# Patient Record
Sex: Male | Born: 1975 | Race: White | Hispanic: No | Marital: Single | Smoking: Current every day smoker
Health system: Southern US, Community
[De-identification: ages and names within clinical notes are randomized; demographics above are authoritative.]

## PROBLEM LIST (undated history)

## (undated) ENCOUNTER — Emergency Department (HOSPITAL_COMMUNITY): Admission: EM | Payer: Medicaid Other | Source: Home / Self Care

## (undated) DIAGNOSIS — F419 Anxiety disorder, unspecified: Secondary | ICD-10-CM

## (undated) DIAGNOSIS — G709 Myoneural disorder, unspecified: Secondary | ICD-10-CM

## (undated) DIAGNOSIS — F32A Depression, unspecified: Secondary | ICD-10-CM

## (undated) DIAGNOSIS — I10 Essential (primary) hypertension: Secondary | ICD-10-CM

## (undated) DIAGNOSIS — F1990 Other psychoactive substance use, unspecified, uncomplicated: Secondary | ICD-10-CM

## (undated) DIAGNOSIS — F329 Major depressive disorder, single episode, unspecified: Secondary | ICD-10-CM

## (undated) DIAGNOSIS — M549 Dorsalgia, unspecified: Secondary | ICD-10-CM

## (undated) DIAGNOSIS — T7840XA Allergy, unspecified, initial encounter: Secondary | ICD-10-CM

## (undated) DIAGNOSIS — F4310 Post-traumatic stress disorder, unspecified: Secondary | ICD-10-CM

## (undated) DIAGNOSIS — M79604 Pain in right leg: Secondary | ICD-10-CM

## (undated) DIAGNOSIS — F319 Bipolar disorder, unspecified: Secondary | ICD-10-CM

## (undated) HISTORY — DX: Anxiety disorder, unspecified: F41.9

## (undated) HISTORY — DX: Myoneural disorder, unspecified: G70.9

## (undated) HISTORY — DX: Allergy, unspecified, initial encounter: T78.40XA

## (undated) HISTORY — PX: CHEST TUBE INSERTION: SHX231

---

## 2001-07-09 ENCOUNTER — Emergency Department (HOSPITAL_COMMUNITY): Admission: EM | Admit: 2001-07-09 | Discharge: 2001-07-09 | Payer: Self-pay | Admitting: Emergency Medicine

## 2001-07-09 ENCOUNTER — Encounter: Payer: Self-pay | Admitting: Emergency Medicine

## 2002-01-04 ENCOUNTER — Inpatient Hospital Stay (HOSPITAL_COMMUNITY): Admission: EM | Admit: 2002-01-04 | Discharge: 2002-01-09 | Payer: Self-pay | Admitting: Emergency Medicine

## 2002-01-04 ENCOUNTER — Encounter: Payer: Self-pay | Admitting: General Surgery

## 2002-01-04 ENCOUNTER — Encounter: Payer: Self-pay | Admitting: Emergency Medicine

## 2002-01-05 ENCOUNTER — Encounter: Payer: Self-pay | Admitting: General Surgery

## 2002-01-07 ENCOUNTER — Encounter: Payer: Self-pay | Admitting: General Surgery

## 2002-01-09 ENCOUNTER — Encounter: Payer: Self-pay | Admitting: General Surgery

## 2002-01-10 ENCOUNTER — Encounter: Payer: Self-pay | Admitting: Family Medicine

## 2002-01-10 ENCOUNTER — Inpatient Hospital Stay (HOSPITAL_COMMUNITY): Admission: EM | Admit: 2002-01-10 | Discharge: 2002-01-10 | Payer: Self-pay | Admitting: Emergency Medicine

## 2002-01-15 ENCOUNTER — Emergency Department (HOSPITAL_COMMUNITY): Admission: EM | Admit: 2002-01-15 | Discharge: 2002-01-15 | Payer: Self-pay | Admitting: *Deleted

## 2002-01-15 ENCOUNTER — Encounter: Payer: Self-pay | Admitting: *Deleted

## 2002-03-01 ENCOUNTER — Emergency Department (HOSPITAL_COMMUNITY): Admission: EM | Admit: 2002-03-01 | Discharge: 2002-03-01 | Payer: Self-pay | Admitting: *Deleted

## 2002-03-01 ENCOUNTER — Encounter: Payer: Self-pay | Admitting: *Deleted

## 2003-04-02 ENCOUNTER — Emergency Department (HOSPITAL_COMMUNITY): Admission: EM | Admit: 2003-04-02 | Discharge: 2003-04-02 | Payer: Self-pay | Admitting: Emergency Medicine

## 2007-05-09 ENCOUNTER — Emergency Department: Payer: Self-pay | Admitting: Emergency Medicine

## 2008-01-15 ENCOUNTER — Inpatient Hospital Stay (HOSPITAL_COMMUNITY): Admission: EM | Admit: 2008-01-15 | Discharge: 2008-01-17 | Payer: Self-pay | Admitting: Emergency Medicine

## 2008-01-15 ENCOUNTER — Ambulatory Visit: Payer: Self-pay | Admitting: Cardiology

## 2010-01-16 ENCOUNTER — Emergency Department (HOSPITAL_COMMUNITY): Admission: EM | Admit: 2010-01-16 | Discharge: 2010-01-16 | Payer: Self-pay | Admitting: Emergency Medicine

## 2010-02-12 ENCOUNTER — Emergency Department (HOSPITAL_COMMUNITY): Admission: EM | Admit: 2010-02-12 | Discharge: 2010-02-12 | Payer: Self-pay | Admitting: Emergency Medicine

## 2010-03-03 ENCOUNTER — Emergency Department (HOSPITAL_COMMUNITY): Admission: EM | Admit: 2010-03-03 | Discharge: 2010-03-03 | Payer: Self-pay | Admitting: Emergency Medicine

## 2010-05-25 ENCOUNTER — Emergency Department (HOSPITAL_COMMUNITY): Payer: Self-pay

## 2010-05-25 ENCOUNTER — Emergency Department (HOSPITAL_COMMUNITY)
Admission: EM | Admit: 2010-05-25 | Discharge: 2010-05-25 | Disposition: A | Discharge status: Home / Self Care | Payer: Self-pay | Attending: Emergency Medicine | Admitting: Emergency Medicine

## 2010-05-25 DIAGNOSIS — X500XXA Overexertion from strenuous movement or load, initial encounter: Secondary | ICD-10-CM | POA: Insufficient documentation

## 2010-05-25 DIAGNOSIS — IMO0002 Reserved for concepts with insufficient information to code with codable children: Secondary | ICD-10-CM | POA: Insufficient documentation

## 2010-06-09 ENCOUNTER — Emergency Department (HOSPITAL_COMMUNITY)
Admission: EM | Admit: 2010-06-09 | Discharge: 2010-06-09 | Disposition: A | Discharge status: Home / Self Care | Payer: Self-pay | Attending: Emergency Medicine | Admitting: Emergency Medicine

## 2010-06-09 ENCOUNTER — Emergency Department (HOSPITAL_COMMUNITY): Payer: Self-pay

## 2010-06-09 DIAGNOSIS — R071 Chest pain on breathing: Secondary | ICD-10-CM | POA: Insufficient documentation

## 2010-06-09 DIAGNOSIS — F1410 Cocaine abuse, uncomplicated: Secondary | ICD-10-CM | POA: Insufficient documentation

## 2010-06-09 DIAGNOSIS — F411 Generalized anxiety disorder: Secondary | ICD-10-CM | POA: Insufficient documentation

## 2010-06-09 DIAGNOSIS — I252 Old myocardial infarction: Secondary | ICD-10-CM | POA: Insufficient documentation

## 2010-06-09 DIAGNOSIS — F3289 Other specified depressive episodes: Secondary | ICD-10-CM | POA: Insufficient documentation

## 2010-06-09 DIAGNOSIS — F329 Major depressive disorder, single episode, unspecified: Secondary | ICD-10-CM | POA: Insufficient documentation

## 2010-06-09 LAB — DIFFERENTIAL
Basophils Absolute: 0 10*3/uL (ref 0.0–0.1)
Basophils Relative: 1 % (ref 0–1)
Eosinophils Absolute: 0.2 10*3/uL (ref 0.0–0.7)
Eosinophils Relative: 4 % (ref 0–5)
Lymphocytes Relative: 42 % (ref 12–46)
Lymphs Abs: 2.9 10*3/uL (ref 0.7–4.0)
Monocytes Absolute: 0.5 10*3/uL (ref 0.1–1.0)
Monocytes Relative: 7 % (ref 3–12)
Neutro Abs: 3.2 K/uL (ref 1.7–7.7)
Neutrophils Relative %: 47 % (ref 43–77)

## 2010-06-09 LAB — POCT CARDIAC MARKERS
CKMB, poc: <1 ng/mL — ABNORMAL LOW (ref 1.0–8.0)
CKMB, poc: <1 ng/mL — ABNORMAL LOW (ref 1.0–8.0)
Myoglobin, poc: 45 ng/mL (ref 12–200)
Myoglobin, poc: 52.2 ng/mL (ref 12–200)
Troponin i, poc: <0.05 ng/mL (ref 0.00–0.09)
Troponin i, poc: <0.05 ng/mL (ref 0.00–0.09)

## 2010-06-09 LAB — CBC
HCT: 48.3 % (ref 39.0–52.0)
Hemoglobin: 16.8 g/dL (ref 13.0–17.0)
MCH: 34.4 pg — ABNORMAL HIGH (ref 26.0–34.0)
MCHC: 34.8 g/dL (ref 30.0–36.0)
MCV: 98.8 fL (ref 78.0–100.0)
Platelets: 333 10*3/uL (ref 150–400)
RBC: 4.89 MIL/uL (ref 4.22–5.81)
RDW: 12.1 % (ref 11.5–15.5)
WBC: 6.9 K/uL (ref 4.0–10.5)

## 2010-06-09 LAB — RAPID URINE DRUG SCREEN, HOSP PERFORMED
Amphetamines: NOT DETECTED
Barbiturates: NOT DETECTED
Benzodiazepines: POSITIVE — AB
Cocaine: NOT DETECTED
Opiates: POSITIVE — AB
Tetrahydrocannabinol: NOT DETECTED

## 2010-06-09 LAB — BASIC METABOLIC PANEL WITH GFR
Chloride: 98 meq/L (ref 96–112)
GFR calc Af Amer: >60 mL/min (ref >60)
Glucose, Bld: 107 mg/dL — ABNORMAL HIGH (ref 70–99)
Potassium: 4 meq/L (ref 3.5–5.1)
Sodium: 136 meq/L (ref 135–145)

## 2010-06-09 LAB — BASIC METABOLIC PANEL
BUN: 12 mg/dL (ref 6–23)
CO2: 27 mEq/L (ref 19–32)
Calcium: 9.2 mg/dL (ref 8.4–10.5)
Creatinine, Ser: 1.15 mg/dL (ref 0.4–1.5)
GFR calc non Af Amer: >60 mL/min (ref >60)

## 2010-06-09 LAB — D-DIMER, QUANTITATIVE: D-Dimer, Quant: 0.36 ug{FEU}/mL (ref 0.00–0.48)

## 2010-06-25 ENCOUNTER — Emergency Department (HOSPITAL_COMMUNITY)
Admission: EM | Admit: 2010-06-25 | Discharge: 2010-06-25 | Disposition: A | Discharge status: Home / Self Care | Payer: Self-pay | Attending: Emergency Medicine | Admitting: Emergency Medicine

## 2010-06-25 DIAGNOSIS — M25569 Pain in unspecified knee: Secondary | ICD-10-CM | POA: Insufficient documentation

## 2010-07-17 ENCOUNTER — Emergency Department (HOSPITAL_COMMUNITY)
Admission: EM | Admit: 2010-07-17 | Discharge: 2010-07-17 | Disposition: A | Discharge status: Home / Self Care | Payer: Self-pay | Attending: Emergency Medicine | Admitting: Emergency Medicine

## 2010-07-17 DIAGNOSIS — F17200 Nicotine dependence, unspecified, uncomplicated: Secondary | ICD-10-CM | POA: Insufficient documentation

## 2010-07-17 DIAGNOSIS — K047 Periapical abscess without sinus: Secondary | ICD-10-CM | POA: Insufficient documentation

## 2010-08-15 ENCOUNTER — Emergency Department (HOSPITAL_COMMUNITY)
Admission: EM | Admit: 2010-08-15 | Discharge: 2010-08-15 | Disposition: A | Discharge status: Home / Self Care | Payer: Self-pay | Attending: Emergency Medicine | Admitting: Emergency Medicine

## 2010-08-15 DIAGNOSIS — K089 Disorder of teeth and supporting structures, unspecified: Secondary | ICD-10-CM | POA: Insufficient documentation

## 2010-08-15 DIAGNOSIS — K029 Dental caries, unspecified: Secondary | ICD-10-CM | POA: Insufficient documentation

## 2010-08-19 NOTE — Consult Note (Signed)
NAMERED, MANDT               ACCOUNT NO.:  [id number]   MEDICAL RECORD NO.:  [id number]          PATIENT TYPE:  INP   LOCATION:  2022                         FACILITY:  [facility]   PHYSICIAN:  Jesse Sans. Wall, MD, FACCDATE OF BIRTH:  09-17-1975   DATE OF CONSULTATION:  01/16/2008  DATE OF DISCHARGE:                                 CONSULTATION   HISTORY:  Mr. Theodore Crawford is a 35 year old divorced white male who was  admitted with severe pleuritic chest pain.  He described a sharp  stabbing pain in his chest and making him crawl across the floor.  It  went up into his right shoulder.   He is ruled out for myocardial infarction with cardiac enzymes.  His EKG  shows early repolarization.  His D-dimer was slightly positive.  His CT  scan showed no pulmonary embolus.   He is currently pain free.  His nitro is being weaned in 2900.  He is  being transferred to 2000.   His history is remarkable for severe substance abuse.  He drinks  extremely heavily, proclaims to be an alcoholic, smokes anywhere from a  pack to 2 packs of cigarettes a day to being on beach drinking, and has  a long history of using cocaine, though he says he has not used in  years.  Unfortunately, his urine drug screen tested positive for cocaine  as well as marijuana.   PAST MEDICAL HISTORY:  Significant for depression and anxiety.   MEDICATIONS:  Question whether he is on Paxil, unknown dosage.   ALLERGIES:  PENICILLIN.   SOCIAL HISTORY:  He lives in [place].  He is visiting his kids here in  [place].   FAMILY HISTORY:  Really noncontributory.   REVIEW OF SYSTEMS:  Apart from the HPI is negative.   PHYSICAL EXAMINATION:  VITAL SIGNS:  Today, his blood pressure is  currently 136/85, mean is 97, heart rate 70 in sinus rhythm, O2 sats  98%, and respiratory rate is 13.  GENERAL:  He is in no acute distress.  He is fairly dramatic with his  story.  He is alert and oriented x3.  He is not depressed  acting at  least.  SKIN:  Warm and dry.  HEENT:  Normocephalic and atraumatic.  PERRLA.  Extraocular movements  are intact.  Sclerae are clear.  Facial symmetry is normal.  NECK:  Carotid upstrokes are equal bilaterally without bruits.  No JVD.  Thyroid is not enlarged.  Trachea is midline.  LUNGS:  Clear.  There is no pleuritic pain or pleuritic rub, but he does  wince with pain when he takes a deep breath.  HEART:  A nondisplaced PMI.  Normal S1 and S2.  No gallop.  ABDOMEN:  Soft.  Good bowel sounds.  No midline bruit.  No hepatomegaly.  EXTREMITIES:  There is no cyanosis, clubbing, or edema.  Pulses are  intact.  NEUROLOGIC:  Grossly intact.   ASSESSMENT:  1. Noncardiac chest pain.  I suspect anything pleurisy.  2. Multisubstance abuse including alcohol, cocaine, and marijuana.  3. Tobacco use.  RECOMMENDATIONS:  1. No further cardiac workup recommended.  2. Rehab.  3. Discontinue tobacco.   Thank you for the consultation.      Thomas C. Daleen Squibb, MD, [facility]  Electronically Signed     TCW/MEDQ  D:  01/16/2008  T:  01/17/2008  Job:  [id number]

## 2010-08-19 NOTE — H&P (Signed)
Theodore Crawford, Theodore Crawford               ACCOUNT NO.:  [id number]   MEDICAL RECORD NO.:  [id number]          PATIENT TYPE:  INP   LOCATION:  2925                         FACILITY:  [facility]   PHYSICIAN:  Wilson Singer, M.D.DATE OF BIRTH:  Apr 04, 1976   DATE OF ADMISSION:  01/15/2008  DATE OF DISCHARGE:                              HISTORY & PHYSICAL   HISTORY:  This is a 35 year old man who has a longstanding history of  cocaine and alcohol abuse as well as tobacco abuse who presents today  with almost a 9- to 10-hour history of right greater than left-sided  pleuritic chest pain.  He woke up this morning about 9:00 a.m. and  started to get the pain.  He has also had a cough recently, which is  largely nonproductive.  The pain was very severe and this led him to  bring himself to the emergency room.  There is some remote history of  possibly not verified of cocaine-induced myocardial ischemia, but this  is not verified on the E-chart here.  He lives in [place] and is  visiting the [place] at the present time.   PAST MEDICAL HISTORY:  1. Cocaine abuse.  2. Alcohol abuse.  3. Depression and anxiety.   MEDICATION:  Paxil dose unclear, which he stopped taking 3 weeks ago.   ALLERGIES:  PENICILLIN.   SOCIAL HISTORY:  He lives in [place] and is visiting as mentioned  above.  History of cocaine and tobacco abuse.   FAMILY HISTORY:  Noncontributory.   REVIEW OF SYSTEMS:  Apart from symptoms mentioned above, there are no  other symptoms referable to.  All systems reviewed.   PHYSICAL EXAMINATION:  VITAL SIGNS:  Temperature 97.5, blood pressure  140/82, pulse 80 and in sinus rhythm, respiratory rate 14-16, saturation  97% on room air.  CARDIOVASCULAR:  Heart sounds are present and normal.  There is no  pericardial rub.  RESPIRATORY:  Lung fields are clear with some baseline crackles  bilaterally.  There is no pleural rub.  ABDOMEN:  Soft and nontender with no  hepatosplenomegaly.  NEUROLOGICAL:  He is alert and oriented with no focal neurological  signs.   INVESTIGATIONS:  Electrocardiogram shows normal sinus rhythm with no ST-  T wave changes and is essentially within normal limits.  Troponin so far  has been less than 0.05.  Sodium 132, potassium 4.6, chloride 107, BUN  12, creatinine 1.1, hemoglobin 18.4.  Urine drug screen is positive for  opiates as well as cocaine as well as tetrahydrocannabinol.   IMPRESSION:  1. Chest pain, pleuritic, unclear etiology.  2. Cocaine and marijuana abuse.  3. Depression and anxiety.   PLAN:  1. Admit.  2. Serial cardiac enzymes and electrocardiograms.  3. Check a D-dimer in case we are dealing with a thromboembolic      phenomenon, although I doubt this.  4. Analgesia.   Further recommendations will depend on the patient's hospital progress.      Wilson Singer, M.D.  Electronically Signed     NCG/MEDQ  D:  01/15/2008  T:  01/16/2008  Job:  [id number] 
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
## 2010-08-22 NOTE — Discharge Summary (Signed)
NAMETAJH, LIVSEY               ACCOUNT NO.:  [id number]   MEDICAL RECORD NO.:  [id number]          PATIENT TYPE:  INP   LOCATION:  2022                         FACILITY:  [facility]   PHYSICIAN:  Marcellus Scott, MD     DATE OF BIRTH:  05/06/1975   DATE OF ADMISSION:  01/15/2008  DATE OF DISCHARGE:  01/17/2008                               DISCHARGE SUMMARY   The patient signed out against medical advice.   DISCHARGE DIAGNOSES:  1. Right pleuritic chest pain - unclear etiology, question infectious      bronchiolitis.  2. Alcohol abuse.  3. Tobacco abuse.  4. Polysubstance abuse.  5. Macrocytosis.   DISCHARGE MEDICATIONS:  The patient did not wait and signed out against  medical advice.   PROCEDURES:  1. X-ray of the right shoulder on January 17, 2008.  Impression:  Negative.  1. CT angiogram of the chest on January 15, 2008.  Impression:  A faint nodularity in the posterior basal segment right  lower lobe likely due to infectious bronchiolitis.  The largest nodule  in the vicinity is 5 mm in diameter, although, it may represent 2  smaller confluent nodules.  No embolus identified.  1. Chest x-ray on January 15, 2008.  Impression:  No acute infiltrate or edema.  Mild central increased  bronchial markings without focal consolidation.   PERTINENT LABS:  Cardiac enzymes were cycled and negative.  ESR 4, TSH  1.592.  lipid panel unremarkable.  Comprehensive metabolic panel  unremarkable with BUN 14, creatinine 1.07.  CBCs within normal limits  except MCV of 101.7.  D-dimer was positive at 0.79.  Urine drug screen  was positive for cocaine, opiates, and tetrahydrocannabinol.   CONSULTATIONS:  Cardiology, Dr. Valera Castle from [facility].   HOSPITAL COURSE:  Mr. Xiao is a 35 year old gentleman with history of  long-standing cocaine and alcohol abuse, tobacco abuse, who presented  with 9-10 hour history of right greater than the left-sided pleuritic  chest pain, cough,  largely nonproductive.  He gave some history of  remote cocaine-induced myocardial ischemia, but had then signed out  against medical advice without waiting to be evaluated.  He was admitted  to the step-down unit.  Cardiac enzymes were cycled and negative.  His D-  dimer was positive following which CT angiogram was done and PE was  ruled out.  He was placed on nitroglycerin drip.  This pain was thought  to be pleuritic in nature, so NSAIDs were started and nitroglycerin was  tapered off.  Given the previous history of ischemia and question for  angina, cardiac consult was requested.  Dr. Daleen Squibb indicated that this was  noncardiac chest pain and suggested pleurisy, and recommended rehab and  no further cardiac workup.  1. Question infectious bronchiolitis.  The patient was on p.o. Avelox.  2. Alcohol abuse.  The patient was placed on Ativan withdrawal      protocol and multivitamins.  He did not demonstrate overt      withdrawal symptoms.  3. Tobacco abuse.  The patient was counseled and placed on nicotine  patch.  4. Polysubstance abuse, cocaine and tetrahydrocannabinol.  The patient      was counseled regarding cessation.  5. 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   On January 17, 2008, the patient indicated that he had intermittent  chest pain.  According to nursing, there was some question of medication  seeking behavior.  Some adjustments were made to his medications where  the patient's narcotics were reduced.  Some time after this, the patient  got angry because he was taken off telemetry and his medications were  being reduced and left AMA without waiting to speak with MD, despite  being told by the nursing staff.      Marcellus Scott, MD  Electronically Signed     AH/MEDQ  D:  02/14/2008  T:  02/15/2008  Job:  [id number]

## 2010-08-29 ENCOUNTER — Emergency Department (HOSPITAL_COMMUNITY)
Admission: EM | Admit: 2010-08-29 | Discharge: 2010-08-29 | Disposition: A | Discharge status: Home / Self Care | Payer: Self-pay | Attending: Emergency Medicine | Admitting: Emergency Medicine

## 2010-08-29 DIAGNOSIS — K089 Disorder of teeth and supporting structures, unspecified: Secondary | ICD-10-CM | POA: Insufficient documentation

## 2010-08-30 ENCOUNTER — Emergency Department (HOSPITAL_COMMUNITY)
Admission: EM | Admit: 2010-08-30 | Discharge: 2010-08-30 | Disposition: A | Discharge status: Home / Self Care | Payer: Self-pay | Attending: Emergency Medicine | Admitting: Emergency Medicine

## 2010-08-30 DIAGNOSIS — I10 Essential (primary) hypertension: Secondary | ICD-10-CM | POA: Insufficient documentation

## 2010-08-30 DIAGNOSIS — F411 Generalized anxiety disorder: Secondary | ICD-10-CM | POA: Insufficient documentation

## 2010-08-30 DIAGNOSIS — Z79899 Other long term (current) drug therapy: Secondary | ICD-10-CM | POA: Insufficient documentation

## 2010-08-30 DIAGNOSIS — F329 Major depressive disorder, single episode, unspecified: Secondary | ICD-10-CM | POA: Insufficient documentation

## 2010-08-30 DIAGNOSIS — F3289 Other specified depressive episodes: Secondary | ICD-10-CM | POA: Insufficient documentation

## 2010-08-30 DIAGNOSIS — K029 Dental caries, unspecified: Secondary | ICD-10-CM | POA: Insufficient documentation

## 2010-09-03 ENCOUNTER — Emergency Department (HOSPITAL_COMMUNITY)
Admission: EM | Admit: 2010-09-03 | Discharge: 2010-09-03 | Disposition: A | Discharge status: Home / Self Care | Payer: Self-pay | Attending: Emergency Medicine | Admitting: Emergency Medicine

## 2010-09-03 DIAGNOSIS — F1010 Alcohol abuse, uncomplicated: Secondary | ICD-10-CM | POA: Insufficient documentation

## 2010-09-03 DIAGNOSIS — I10 Essential (primary) hypertension: Secondary | ICD-10-CM | POA: Insufficient documentation

## 2010-09-03 DIAGNOSIS — F341 Dysthymic disorder: Secondary | ICD-10-CM | POA: Insufficient documentation

## 2010-09-03 DIAGNOSIS — Z79899 Other long term (current) drug therapy: Secondary | ICD-10-CM | POA: Insufficient documentation

## 2010-09-03 DIAGNOSIS — R569 Unspecified convulsions: Secondary | ICD-10-CM | POA: Insufficient documentation

## 2010-09-03 LAB — RAPID URINE DRUG SCREEN, HOSP PERFORMED
Amphetamines: NOT DETECTED
Barbiturates: NOT DETECTED
Benzodiazepines: POSITIVE — AB
Cocaine: NOT DETECTED
Opiates: POSITIVE — AB
Tetrahydrocannabinol: NOT DETECTED

## 2010-09-03 LAB — POCT I-STAT, CHEM 8
BUN: 4 mg/dL — ABNORMAL LOW (ref 6–23)
Creatinine, Ser: 0.9 mg/dL (ref 0.4–1.5)
Glucose, Bld: 127 mg/dL — ABNORMAL HIGH (ref 70–99)
HCT: 49 % (ref 39.0–52.0)
Hemoglobin: 16.7 g/dL (ref 13.0–17.0)
TCO2: 19 mmol/L (ref 0–100)

## 2010-09-08 ENCOUNTER — Emergency Department (HOSPITAL_COMMUNITY)
Admission: EM | Admit: 2010-09-08 | Discharge: 2010-09-08 | Disposition: A | Discharge status: Home / Self Care | Payer: Self-pay | Attending: Emergency Medicine | Admitting: Emergency Medicine

## 2010-09-08 DIAGNOSIS — G40909 Epilepsy, unspecified, not intractable, without status epilepticus: Secondary | ICD-10-CM | POA: Insufficient documentation

## 2010-09-08 DIAGNOSIS — M79609 Pain in unspecified limb: Secondary | ICD-10-CM | POA: Insufficient documentation

## 2010-09-08 DIAGNOSIS — F17200 Nicotine dependence, unspecified, uncomplicated: Secondary | ICD-10-CM | POA: Insufficient documentation

## 2010-09-12 ENCOUNTER — Emergency Department (HOSPITAL_COMMUNITY)
Admission: EM | Admit: 2010-09-12 | Discharge: 2010-09-12 | Disposition: A | Discharge status: Home / Self Care | Payer: Self-pay | Attending: Emergency Medicine | Admitting: Emergency Medicine

## 2010-09-12 DIAGNOSIS — M25569 Pain in unspecified knee: Secondary | ICD-10-CM | POA: Insufficient documentation

## 2010-09-12 DIAGNOSIS — F17200 Nicotine dependence, unspecified, uncomplicated: Secondary | ICD-10-CM | POA: Insufficient documentation

## 2010-09-22 ENCOUNTER — Emergency Department (HOSPITAL_COMMUNITY): Payer: Self-pay

## 2010-09-22 ENCOUNTER — Emergency Department (HOSPITAL_COMMUNITY)
Admission: EM | Admit: 2010-09-22 | Discharge: 2010-09-22 | Disposition: A | Discharge status: Home / Self Care | Payer: Self-pay | Attending: Emergency Medicine | Admitting: Emergency Medicine

## 2010-09-22 DIAGNOSIS — F411 Generalized anxiety disorder: Secondary | ICD-10-CM | POA: Insufficient documentation

## 2010-09-22 DIAGNOSIS — I10 Essential (primary) hypertension: Secondary | ICD-10-CM | POA: Insufficient documentation

## 2010-09-22 DIAGNOSIS — M25469 Effusion, unspecified knee: Secondary | ICD-10-CM | POA: Insufficient documentation

## 2010-09-22 DIAGNOSIS — Z79899 Other long term (current) drug therapy: Secondary | ICD-10-CM | POA: Insufficient documentation

## 2010-09-22 DIAGNOSIS — F3289 Other specified depressive episodes: Secondary | ICD-10-CM | POA: Insufficient documentation

## 2010-09-22 DIAGNOSIS — F329 Major depressive disorder, single episode, unspecified: Secondary | ICD-10-CM | POA: Insufficient documentation

## 2010-09-27 ENCOUNTER — Emergency Department (HOSPITAL_COMMUNITY)
Admission: EM | Admit: 2010-09-27 | Discharge: 2010-09-28 | Disposition: A | Discharge status: Home / Self Care | Payer: Self-pay | Attending: Emergency Medicine | Admitting: Emergency Medicine

## 2010-09-27 DIAGNOSIS — F1310 Sedative, hypnotic or anxiolytic abuse, uncomplicated: Secondary | ICD-10-CM | POA: Insufficient documentation

## 2010-09-27 DIAGNOSIS — I10 Essential (primary) hypertension: Secondary | ICD-10-CM | POA: Insufficient documentation

## 2010-09-27 DIAGNOSIS — M549 Dorsalgia, unspecified: Secondary | ICD-10-CM | POA: Insufficient documentation

## 2010-09-27 DIAGNOSIS — F329 Major depressive disorder, single episode, unspecified: Secondary | ICD-10-CM | POA: Insufficient documentation

## 2010-09-27 DIAGNOSIS — G8929 Other chronic pain: Secondary | ICD-10-CM | POA: Insufficient documentation

## 2010-09-27 DIAGNOSIS — F1010 Alcohol abuse, uncomplicated: Secondary | ICD-10-CM | POA: Insufficient documentation

## 2010-09-27 DIAGNOSIS — M542 Cervicalgia: Secondary | ICD-10-CM | POA: Insufficient documentation

## 2010-09-27 DIAGNOSIS — F3289 Other specified depressive episodes: Secondary | ICD-10-CM | POA: Insufficient documentation

## 2010-09-27 DIAGNOSIS — F1910 Other psychoactive substance abuse, uncomplicated: Secondary | ICD-10-CM | POA: Insufficient documentation

## 2010-09-27 LAB — RAPID URINE DRUG SCREEN, HOSP PERFORMED
Amphetamines: NOT DETECTED
Barbiturates: NOT DETECTED
Benzodiazepines: POSITIVE — AB
Cocaine: NOT DETECTED

## 2010-09-27 LAB — DIFFERENTIAL
Basophils Absolute: 0 10*3/uL (ref 0.0–0.1)
Basophils Relative: 0 % (ref 0–1)
Eosinophils Absolute: 0.1 10*3/uL (ref 0.0–0.7)
Eosinophils Relative: 1 % (ref 0–5)
Lymphs Abs: 1.7 10*3/uL (ref 0.7–4.0)
Neutrophils Relative %: 72 % (ref 43–77)

## 2010-09-27 LAB — COMPREHENSIVE METABOLIC PANEL
Albumin: 3.9 g/dL (ref 3.5–5.2)
Alkaline Phosphatase: 79 U/L (ref 39–117)
BUN: 9 mg/dL (ref 6–23)
CO2: 27 mEq/L (ref 19–32)
Chloride: 96 mEq/L (ref 96–112)
Creatinine, Ser: 0.84 mg/dL (ref 0.50–1.35)
GFR calc non Af Amer: >60 mL/min (ref >60)
Total Bilirubin: 0.4 mg/dL (ref 0.3–1.2)
Total Protein: 7.2 g/dL (ref 6.0–8.3)

## 2010-09-27 LAB — ETHANOL: Alcohol, Ethyl (B): 164 mg/dL — ABNORMAL HIGH (ref 0–11)

## 2010-09-27 LAB — CBC
HCT: 40 % (ref 39.0–52.0)
MCH: 34.4 pg — ABNORMAL HIGH (ref 26.0–34.0)
MCHC: 35.8 g/dL (ref 30.0–36.0)
MCV: 96.2 fL (ref 78.0–100.0)
Platelets: 280 10*3/uL (ref 150–400)

## 2010-10-07 ENCOUNTER — Emergency Department (HOSPITAL_COMMUNITY)
Admission: EM | Admit: 2010-10-07 | Discharge: 2010-10-08 | Discharge status: Against Medical Advice | Payer: Self-pay | Attending: Emergency Medicine | Admitting: Emergency Medicine

## 2010-10-07 ENCOUNTER — Emergency Department (HOSPITAL_COMMUNITY)
Admission: EM | Admit: 2010-10-07 | Discharge: 2010-10-07 | Discharge status: Against Medical Advice | Payer: Self-pay | Attending: Emergency Medicine | Admitting: Emergency Medicine

## 2010-10-07 DIAGNOSIS — R569 Unspecified convulsions: Secondary | ICD-10-CM | POA: Insufficient documentation

## 2010-10-07 DIAGNOSIS — I10 Essential (primary) hypertension: Secondary | ICD-10-CM | POA: Insufficient documentation

## 2010-10-07 DIAGNOSIS — G8929 Other chronic pain: Secondary | ICD-10-CM | POA: Insufficient documentation

## 2010-10-07 DIAGNOSIS — F1910 Other psychoactive substance abuse, uncomplicated: Secondary | ICD-10-CM | POA: Insufficient documentation

## 2010-10-08 ENCOUNTER — Emergency Department (HOSPITAL_COMMUNITY): Payer: Self-pay

## 2010-10-08 LAB — DIFFERENTIAL
Eosinophils Absolute: 0.1 10*3/uL (ref 0.0–0.7)
Eosinophils Relative: 1 % (ref 0–5)
Lymphocytes Relative: 26 % (ref 12–46)
Neutro Abs: 5.9 10*3/uL (ref 1.7–7.7)

## 2010-10-08 LAB — CBC
HCT: 43.2 % (ref 39.0–52.0)
Hemoglobin: 14.9 g/dL (ref 13.0–17.0)
MCH: 33.9 pg (ref 26.0–34.0)
MCHC: 34.5 g/dL (ref 30.0–36.0)
RBC: 4.39 MIL/uL (ref 4.22–5.81)
RDW: 12.8 % (ref 11.5–15.5)
WBC: 9 10*3/uL (ref 4.0–10.5)

## 2010-10-08 LAB — BASIC METABOLIC PANEL
Calcium: 9.4 mg/dL (ref 8.4–10.5)
Chloride: 102 mEq/L (ref 96–112)
GFR calc Af Amer: >60 mL/min (ref >60)
Potassium: 3.6 mEq/L (ref 3.5–5.1)

## 2011-01-06 LAB — RAPID URINE DRUG SCREEN, HOSP PERFORMED
Barbiturates: NOT DETECTED
Benzodiazepines: NOT DETECTED
Opiates: POSITIVE — AB
Tetrahydrocannabinol: POSITIVE — AB

## 2011-01-06 LAB — COMPREHENSIVE METABOLIC PANEL
ALT: 27
BUN: 14
Calcium: 9.2
Glucose, Bld: 85
Sodium: 136
Total Protein: 6.2

## 2011-01-06 LAB — CK TOTAL AND CKMB (NOT AT ARMC)
CK, MB: 2
Relative Index: 0.8

## 2011-01-06 LAB — CBC
Hemoglobin: 15.1
MCHC: 34.1
RDW: 12.3

## 2011-01-06 LAB — LIPID PANEL
Cholesterol: 176
HDL: 85
LDL Cholesterol: 76
Triglycerides: 77

## 2011-01-06 LAB — TSH: TSH: 1.592

## 2011-01-06 LAB — SEDIMENTATION RATE: Sed Rate: 4

## 2011-01-06 LAB — POCT CARDIAC MARKERS
CKMB, poc: <1 — ABNORMAL LOW
Myoglobin, poc: 135
Myoglobin, poc: 74.5
Troponin i, poc: <0.05
Troponin i, poc: <0.05

## 2011-01-06 LAB — POCT I-STAT, CHEM 8
BUN: 12
Chloride: 107
Creatinine, Ser: 1.1
Glucose, Bld: 78
HCT: 54 — ABNORMAL HIGH
Potassium: 4.6

## 2011-01-06 LAB — TROPONIN I: Troponin I: 0.03

## 2011-01-13 ENCOUNTER — Emergency Department (HOSPITAL_COMMUNITY): Payer: Medicaid Other

## 2011-01-13 ENCOUNTER — Emergency Department (HOSPITAL_COMMUNITY)
Admission: EM | Admit: 2011-01-13 | Discharge: 2011-01-13 | Disposition: A | Discharge status: Home / Self Care | Payer: Medicaid Other | Attending: Emergency Medicine | Admitting: Emergency Medicine

## 2011-01-13 ENCOUNTER — Encounter: Payer: Self-pay | Admitting: *Deleted

## 2011-01-13 DIAGNOSIS — M25569 Pain in unspecified knee: Secondary | ICD-10-CM | POA: Insufficient documentation

## 2011-01-13 DIAGNOSIS — F17200 Nicotine dependence, unspecified, uncomplicated: Secondary | ICD-10-CM | POA: Insufficient documentation

## 2011-01-13 DIAGNOSIS — M79609 Pain in unspecified limb: Secondary | ICD-10-CM | POA: Insufficient documentation

## 2011-01-13 DIAGNOSIS — M25561 Pain in right knee: Secondary | ICD-10-CM

## 2011-01-13 DIAGNOSIS — W06XXXA Fall from bed, initial encounter: Secondary | ICD-10-CM | POA: Insufficient documentation

## 2011-01-13 HISTORY — DX: Depression, unspecified: F32.A

## 2011-01-13 HISTORY — DX: Post-traumatic stress disorder, unspecified: F43.10

## 2011-01-13 HISTORY — DX: Anxiety disorder, unspecified: F41.9

## 2011-01-13 HISTORY — DX: Bipolar disorder, unspecified: F31.9

## 2011-01-13 HISTORY — DX: Major depressive disorder, single episode, unspecified: F32.9

## 2011-01-13 HISTORY — DX: Essential (primary) hypertension: I10

## 2011-01-13 HISTORY — DX: Pain in right leg: M79.604

## 2011-01-13 MED ORDER — IBUPROFEN 800 MG PO TABS
800.0000 mg | ORAL_TABLET | Freq: Once | ORAL | Status: AC
  Administered 2011-01-13: 800 mg via ORAL
  Filled 2011-01-13: qty 1

## 2011-01-13 MED ORDER — HYDROCODONE-ACETAMINOPHEN 5-325 MG PO TABS: 1.0000 | ORAL_TABLET | Freq: Four times a day (QID) | ORAL | Status: AC | PRN

## 2011-01-13 MED ORDER — HYDROCODONE-ACETAMINOPHEN 5-325 MG PO TABS
1.0000 | ORAL_TABLET | Freq: Once | ORAL | Status: AC
  Administered 2011-01-13: 1 via ORAL
  Filled 2011-01-13: qty 1
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
## 2011-01-18 ENCOUNTER — Emergency Department (HOSPITAL_COMMUNITY): Payer: Medicaid Other

## 2011-01-18 ENCOUNTER — Emergency Department (HOSPITAL_COMMUNITY)
Admission: EM | Admit: 2011-01-18 | Discharge: 2011-01-18 | Discharge status: Against Medical Advice | Payer: Medicaid Other | Attending: Emergency Medicine | Admitting: Emergency Medicine

## 2011-01-18 DIAGNOSIS — F341 Dysthymic disorder: Secondary | ICD-10-CM | POA: Insufficient documentation

## 2011-01-18 DIAGNOSIS — Z79899 Other long term (current) drug therapy: Secondary | ICD-10-CM | POA: Insufficient documentation

## 2011-01-18 DIAGNOSIS — M545 Low back pain, unspecified: Secondary | ICD-10-CM | POA: Insufficient documentation

## 2011-01-18 DIAGNOSIS — I10 Essential (primary) hypertension: Secondary | ICD-10-CM | POA: Insufficient documentation

## 2011-01-18 DIAGNOSIS — M25569 Pain in unspecified knee: Secondary | ICD-10-CM | POA: Insufficient documentation

## 2011-01-18 DIAGNOSIS — G40909 Epilepsy, unspecified, not intractable, without status epilepticus: Secondary | ICD-10-CM | POA: Insufficient documentation

## 2011-01-18 DIAGNOSIS — G8929 Other chronic pain: Secondary | ICD-10-CM | POA: Insufficient documentation

## 2011-01-18 DIAGNOSIS — R51 Headache: Secondary | ICD-10-CM | POA: Insufficient documentation

## 2011-01-28 ENCOUNTER — Encounter (HOSPITAL_COMMUNITY): Payer: Self-pay | Admitting: *Deleted

## 2011-01-28 ENCOUNTER — Emergency Department (HOSPITAL_COMMUNITY)
Admission: EM | Admit: 2011-01-28 | Discharge: 2011-01-28 | Disposition: A | Discharge status: Home / Self Care | Payer: Medicaid Other | Attending: Emergency Medicine | Admitting: Emergency Medicine

## 2011-01-28 ENCOUNTER — Emergency Department (HOSPITAL_COMMUNITY): Payer: Medicaid Other

## 2011-01-28 DIAGNOSIS — F17200 Nicotine dependence, unspecified, uncomplicated: Secondary | ICD-10-CM | POA: Insufficient documentation

## 2011-01-28 DIAGNOSIS — S93409A Sprain of unspecified ligament of unspecified ankle, initial encounter: Secondary | ICD-10-CM | POA: Insufficient documentation

## 2011-01-28 DIAGNOSIS — I10 Essential (primary) hypertension: Secondary | ICD-10-CM | POA: Insufficient documentation

## 2011-01-28 DIAGNOSIS — F411 Generalized anxiety disorder: Secondary | ICD-10-CM | POA: Insufficient documentation

## 2011-01-28 DIAGNOSIS — X500XXA Overexertion from strenuous movement or load, initial encounter: Secondary | ICD-10-CM | POA: Insufficient documentation

## 2011-01-28 DIAGNOSIS — Y92009 Unspecified place in unspecified non-institutional (private) residence as the place of occurrence of the external cause: Secondary | ICD-10-CM | POA: Insufficient documentation

## 2011-01-28 DIAGNOSIS — S93401A Sprain of unspecified ligament of right ankle, initial encounter: Secondary | ICD-10-CM

## 2011-01-28 DIAGNOSIS — F4310 Post-traumatic stress disorder, unspecified: Secondary | ICD-10-CM | POA: Insufficient documentation

## 2011-01-28 DIAGNOSIS — F319 Bipolar disorder, unspecified: Secondary | ICD-10-CM | POA: Insufficient documentation

## 2011-01-28 MED ORDER — HYDROCODONE-ACETAMINOPHEN 5-325 MG PO TABS
1.0000 | ORAL_TABLET | Freq: Once | ORAL | Status: AC
  Administered 2011-01-28: 1 via ORAL
  Filled 2011-01-28: qty 1

## 2011-01-28 MED ORDER — HYDROCODONE-ACETAMINOPHEN 5-325 MG PO TABS: 1.0000 | ORAL_TABLET | Freq: Four times a day (QID) | ORAL | Status: AC | PRN

## 2011-01-28 MED ORDER — IBUPROFEN 800 MG PO TABS
800.0000 mg | ORAL_TABLET | Freq: Once | ORAL | Status: AC
  Administered 2011-01-28: 800 mg via ORAL
  Filled 2011-01-28: qty 1

## 2011-01-28 NOTE — ED Provider Notes (Signed)
History     CSN: [id number] Arrival date & time: 01/28/2011 12:14 PM   None     Chief Complaint  Patient presents with  . Ankle Pain    (Consider location/radiation/quality/duration/timing/severity/associated sxs/prior treatment) Patient is a 35 y.o. male presenting with ankle pain. The history is provided by the patient. No language interpreter was used.  Ankle Pain  The incident occurred yesterday. The incident occurred at home. Injury mechanism: moving furniture.  walking down stairs backwards carrying a chair and inverted R ankle. The pain is present in the right ankle. The pain has been constant since onset. Associated symptoms include inability to bear weight and loss of motion. He reports no foreign bodies present. The symptoms are aggravated by bearing weight.    Past Medical History  Diagnosis Date  . Leg pain, right   . Hypertension   . Depression   . Bipolar 1 disorder   . PTSD (post-traumatic stress disorder)   . Anxiety disorder     Past Surgical History  Procedure Date  . Chest tube insertion     History reviewed. No pertinent family history.  History  Substance Use Topics  . Smoking status: Current Everyday Smoker -- 1.0 packs/day  . Smokeless tobacco: Not on file  . Alcohol Use: No      Review of Systems  Musculoskeletal: Positive for arthralgias.  All other systems reviewed and are negative.    Allergies  Bee venom and Penicillins  Home Medications   Current Outpatient Rx  Name Route Sig Dispense Refill  . ALPRAZOLAM 1 MG PO TABS Oral Take 1 mg by mouth 4 (four) times daily as needed. For seizure    . GABAPENTIN 300 MG PO CAPS Oral Take 300 mg by mouth daily.     Marland Kitchen LISINOPRIL 20 MG PO TABS Oral Take 20 mg by mouth daily.      Marland Kitchen PAROXETINE HCL 20 MG PO TABS Oral Take 60 mg by mouth every morning.      . ASPIRIN-ACETAMINOPHEN 500-325 MG PO PACK Oral Take 1 Package by mouth daily as needed. headache       BP 144/102  Pulse 95   Temp(Src) 98.5 F (36.9 C) (Oral)  Resp 20  Ht 6' (1.829 m)  Wt 187 lb (84.823 kg)  BMI 25.36 kg/m2  SpO2 97%  Physical Exam  Nursing note and vitals reviewed. Constitutional: He is oriented to person, place, and time. He appears well-developed and well-nourished.  HENT:  Head: Normocephalic and atraumatic.  Eyes: EOM are normal.  Neck: Normal range of motion.  Cardiovascular: Normal rate, regular rhythm, normal heart sounds and intact distal pulses.   Pulmonary/Chest: Effort normal and breath sounds normal. No respiratory distress.  Abdominal: Soft. He exhibits no distension. There is no tenderness.  Musculoskeletal:       Right ankle: He exhibits decreased range of motion and swelling. He exhibits no ecchymosis, no deformity, no laceration and normal pulse. tenderness. Lateral malleolus tenderness found. Achilles tendon normal.       Feet:  Neurological: He is alert and oriented to person, place, and time.  Skin: Skin is warm and dry.  Psychiatric: He has a normal mood and affect. Judgment normal.    ED Course  Procedures (including critical care time)  Labs Reviewed - No data to display Dg Ankle Complete Right  01/28/2011  *RADIOLOGY REPORT*  Clinical Data: Pain.  Injury.  RIGHT ANKLE - COMPLETE 3+ VIEW  Comparison: None.  Findings: No acute osseous  or joint abnormality.  There is ovoid hazy sclerosis in the distal tibial shaft, with slight bulging of the overlying cortex.  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  IMPRESSION: No acute findings.  Original Report Authenticated By: Reyes Ivan, M.D.     No diagnosis found.    MDM          Worthy Rancher, PA 01/28/11 1335  Worthy Rancher, PA 01/28/11 [phone]

## 2011-01-28 NOTE — ED Notes (Signed)
Pt c/o pain to right ankle. Pt states he was moving furniture yesterday and twisted his ankle.

## 2011-01-30 NOTE — ED Provider Notes (Signed)
Medical screening examination/treatment/procedure(s) were performed by non-physician practitioner and as supervising physician I was immediately available for consultation/collaboration.   Issa Luster L Kenzel Ruesch, MD 01/30/11 0902 

## 2011-02-12 ENCOUNTER — Encounter (HOSPITAL_COMMUNITY): Payer: Self-pay | Admitting: Emergency Medicine

## 2011-02-12 ENCOUNTER — Emergency Department (HOSPITAL_COMMUNITY)
Admission: EM | Admit: 2011-02-12 | Discharge: 2011-02-12 | Disposition: A | Discharge status: Home / Self Care | Payer: Medicaid Other | Attending: Emergency Medicine | Admitting: Emergency Medicine

## 2011-02-12 DIAGNOSIS — I10 Essential (primary) hypertension: Secondary | ICD-10-CM | POA: Insufficient documentation

## 2011-02-12 DIAGNOSIS — K0889 Other specified disorders of teeth and supporting structures: Secondary | ICD-10-CM

## 2011-02-12 DIAGNOSIS — F4310 Post-traumatic stress disorder, unspecified: Secondary | ICD-10-CM | POA: Insufficient documentation

## 2011-02-12 DIAGNOSIS — F411 Generalized anxiety disorder: Secondary | ICD-10-CM | POA: Insufficient documentation

## 2011-02-12 DIAGNOSIS — K029 Dental caries, unspecified: Secondary | ICD-10-CM | POA: Insufficient documentation

## 2011-02-12 DIAGNOSIS — F17200 Nicotine dependence, unspecified, uncomplicated: Secondary | ICD-10-CM | POA: Insufficient documentation

## 2011-02-12 DIAGNOSIS — F319 Bipolar disorder, unspecified: Secondary | ICD-10-CM | POA: Insufficient documentation

## 2011-02-12 MED ORDER — CLINDAMYCIN HCL 150 MG PO CAPS: ORAL_CAPSULE | ORAL | Status: DC

## 2011-02-12 MED ORDER — TRAMADOL HCL 50 MG PO TABS: 50.0000 mg | ORAL_TABLET | Freq: Four times a day (QID) | ORAL | Status: AC | PRN

## 2011-02-12 MED ORDER — DICLOFENAC SODIUM 75 MG PO TBEC: 75.0000 mg | DELAYED_RELEASE_TABLET | Freq: Two times a day (BID) | ORAL | Status: DC

## 2011-02-12 NOTE — ED Notes (Signed)
Broken tooth, pain x 2-3 weeks. No swelling noted.

## 2011-02-12 NOTE — ED Provider Notes (Signed)
History     CSN: [id number] Arrival date & time: 02/12/2011  4:03 PM   First MD Initiated Contact with Patient 02/12/11 1607      Chief Complaint  Patient presents with  . Dental Pain    (Consider location/radiation/quality/duration/timing/severity/associated sxs/prior treatment) Patient is a 35 y.o. male presenting with tooth pain. The history is provided by the patient.  Dental PainThe primary symptoms include mouth pain and headaches. Primary symptoms do not include shortness of breath or cough. The symptoms began more than 1 week ago. The symptoms are worsening. The symptoms occur frequently.  The headache is not associated with photophobia.  Additional symptoms include: dental sensitivity to temperature, gum swelling, gum tenderness and jaw pain. Additional symptoms do not include: nosebleeds. Medical issues include: smoking.    Past Medical History  Diagnosis Date  . Leg pain, right   . Hypertension   . Depression   . Bipolar 1 disorder   . PTSD (post-traumatic stress disorder)   . Anxiety disorder     Past Surgical History  Procedure Date  . Chest tube insertion     History reviewed. No pertinent family history.  History  Substance Use Topics  . Smoking status: Current Everyday Smoker -- 1.0 packs/day  . Smokeless tobacco: Not on file  . Alcohol Use: No      Review of Systems  Constitutional: Negative for activity change.       All ROS Neg except as noted in HPI  HENT: Negative for nosebleeds and neck pain.   Eyes: Negative for photophobia and discharge.  Respiratory: Negative for cough, shortness of breath and wheezing.   Cardiovascular: Negative for chest pain and palpitations.  Gastrointestinal: Negative for abdominal pain and blood in stool.  Genitourinary: Negative for dysuria, frequency and hematuria.  Musculoskeletal: Negative for back pain and arthralgias.  Skin: Negative.   Neurological: Positive for headaches. Negative for dizziness, seizures  and speech difficulty.  Psychiatric/Behavioral: Negative for hallucinations and confusion.    Allergies  Bee venom and Penicillins  Home Medications   Current Outpatient Rx  Name Route Sig Dispense Refill  . ALPRAZOLAM 1 MG PO TABS Oral Take 1 mg by mouth 4 (four) times daily as needed. For seizure    . ASPIRIN-ACETAMINOPHEN 500-325 MG PO PACK Oral Take 1 Package by mouth daily as needed. headache     . GABAPENTIN 300 MG PO CAPS Oral Take 300 mg by mouth daily.     Marland Kitchen LISINOPRIL 20 MG PO TABS Oral Take 20 mg by mouth daily.      Marland Kitchen PAROXETINE HCL 20 MG PO TABS Oral Take 60 mg by mouth every morning.        BP 141/95  Pulse 88  Temp(Src) 97.8 F (36.6 C) (Oral)  Resp 18  Ht 6' (1.829 m)  Wt 189 lb (85.73 kg)  BMI 25.63 kg/m2  SpO2 100%  Physical Exam  Nursing note and vitals reviewed. Constitutional: He is oriented to person, place, and time. He appears well-developed and well-nourished.  Non-toxic appearance.  HENT:  Head: Normocephalic.  Right Ear: Tympanic membrane and external ear normal.  Left Ear: Tympanic membrane and external ear normal.       Deep cavity of the 2nd right posterior molar. Swelling around the tooth, no visible abscess.  Eyes: EOM and lids are normal. Pupils are equal, round, and reactive to light.  Neck: Normal range of motion. Neck supple. Carotid bruit is not present. No tracheal deviation present.  Cardiovascular:  Normal rate, regular rhythm, normal heart sounds, intact distal pulses and normal pulses.   Pulmonary/Chest: Breath sounds normal. No respiratory distress.  Abdominal: Soft. Bowel sounds are normal. There is no tenderness. There is no guarding.  Musculoskeletal: Normal range of motion.  Lymphadenopathy:       Head (right side): No submandibular adenopathy present.       Head (left side): No submandibular adenopathy present.    He has no cervical adenopathy.  Neurological: He is alert and oriented to person, place, and time. He has normal  strength. No cranial nerve deficit or sensory deficit.  Skin: Skin is warm and dry.  Psychiatric: He has a normal mood and affect. His speech is normal.    ED Course  Procedures (including critical care time)  Labs Reviewed - No data to display No results found.   Dx: Dental cary     MDM  I have reviewed nursing notes, vital signs, and all appropriate lab and imaging results for this patient. Pt has a deep cavity that will require dental assistance. Discussed need for dental eval with pt.        Kathie Dike, [place] 02/12/11 [phone]

## 2011-02-12 NOTE — ED Notes (Signed)
Pt stated at departure " I might as well of went and got a fifth of liquor" "I cant afford these prescriptions".  Encouraged pt to seek dental care at [facility] department. Pt states does not have a ride.

## 2011-02-13 NOTE — ED Provider Notes (Signed)
Medical screening examination/treatment/procedure(s) were performed by non-physician practitioner and as supervising physician I was immediately available for consultation/collaboration.  Flint Melter, MD 02/13/11 [phone]

## 2011-03-20 ENCOUNTER — Emergency Department (HOSPITAL_COMMUNITY)
Admission: EM | Admit: 2011-03-20 | Discharge: 2011-03-20 | Disposition: A | Discharge status: Home / Self Care | Payer: Medicaid Other | Attending: Emergency Medicine | Admitting: Emergency Medicine

## 2011-03-20 ENCOUNTER — Encounter (HOSPITAL_COMMUNITY): Payer: Self-pay

## 2011-03-20 DIAGNOSIS — I10 Essential (primary) hypertension: Secondary | ICD-10-CM | POA: Insufficient documentation

## 2011-03-20 DIAGNOSIS — F319 Bipolar disorder, unspecified: Secondary | ICD-10-CM | POA: Insufficient documentation

## 2011-03-20 DIAGNOSIS — F4310 Post-traumatic stress disorder, unspecified: Secondary | ICD-10-CM | POA: Insufficient documentation

## 2011-03-20 DIAGNOSIS — F17200 Nicotine dependence, unspecified, uncomplicated: Secondary | ICD-10-CM | POA: Insufficient documentation

## 2011-03-20 DIAGNOSIS — X500XXA Overexertion from strenuous movement or load, initial encounter: Secondary | ICD-10-CM | POA: Insufficient documentation

## 2011-03-20 DIAGNOSIS — J111 Influenza due to unidentified influenza virus with other respiratory manifestations: Secondary | ICD-10-CM | POA: Insufficient documentation

## 2011-03-20 DIAGNOSIS — F411 Generalized anxiety disorder: Secondary | ICD-10-CM | POA: Insufficient documentation

## 2011-03-20 DIAGNOSIS — T148XXA Other injury of unspecified body region, initial encounter: Secondary | ICD-10-CM

## 2011-03-20 DIAGNOSIS — IMO0002 Reserved for concepts with insufficient information to code with codable children: Secondary | ICD-10-CM | POA: Insufficient documentation

## 2011-03-20 HISTORY — DX: Dorsalgia, unspecified: M54.9

## 2011-03-20 MED ORDER — OSELTAMIVIR PHOSPHATE 75 MG PO CAPS: 75.0000 mg | ORAL_CAPSULE | Freq: Two times a day (BID) | ORAL | Status: AC

## 2011-03-20 MED ORDER — OXYCODONE-ACETAMINOPHEN 5-325 MG PO TABS: ORAL_TABLET | ORAL | Status: DC

## 2011-03-20 MED ORDER — OXYCODONE-ACETAMINOPHEN 5-325 MG PO TABS
1.0000 | ORAL_TABLET | Freq: Once | ORAL | Status: AC
  Administered 2011-03-20: 1 via ORAL
  Filled 2011-03-20: qty 1

## 2011-03-20 NOTE — ED Provider Notes (Signed)
History     CSN: [id number] Arrival date & time: 03/20/2011  2:12 PM   First MD Initiated Contact with Patient 03/20/11 1549      Chief Complaint  Patient presents with  . Back Pain    (Consider location/radiation/quality/duration/timing/severity/associated sxs/prior treatment) HPI Comments: Pt  Injured back lifting a sofa bed.  He also c/o flu like sxs x 2 days.    Patient is a 35 y.o. male presenting with back pain. The history is provided by the patient. No language interpreter was used.  Back Pain  This is a new problem. The current episode started 3 to 5 hours ago. The problem occurs constantly. The problem has not changed since onset.The pain is associated with lifting heavy objects. The pain is present in the lumbar spine. The quality of the pain is described as aching. The pain does not radiate. The pain is severe. The symptoms are aggravated by certain positions and bending. Associated symptoms include a fever. He has tried nothing for the symptoms.    Past Medical History  Diagnosis Date  . Leg pain, right   . Hypertension   . Depression   . Bipolar 1 disorder   . PTSD (post-traumatic stress disorder)   . Anxiety disorder   . Back pain     Past Surgical History  Procedure Date  . Chest tube insertion     No family history on file.  History  Substance Use Topics  . Smoking status: Current Everyday Smoker -- 1.0 packs/day  . Smokeless tobacco: Not on file  . Alcohol Use: No      Review of Systems  Constitutional: Positive for fever and chills.  HENT: Positive for sore throat.   Respiratory: Positive for cough.   Musculoskeletal: Positive for myalgias and back pain.  All other systems reviewed and are negative.    Allergies  Bee venom and Penicillins  Home Medications   Current Outpatient Rx  Name Route Sig Dispense Refill  . ALPRAZOLAM 1 MG PO TABS Oral Take 1 mg by mouth 4 (four) times daily as needed. For seizure    . ASPIRIN-ACETAMINOPHEN  500-325 MG PO PACK Oral Take 1 Package by mouth daily as needed. headache     . GABAPENTIN 300 MG PO CAPS Oral Take 300 mg by mouth 3 (three) times daily.     Marland Kitchen LISINOPRIL 20 MG PO TABS Oral Take 20 mg by mouth daily.      Marland Kitchen PAROXETINE HCL 20 MG PO TABS Oral Take 60 mg by mouth every morning.        BP 139/87  Pulse 116  Temp(Src) 99.6 F (37.6 C) (Oral)  Resp 20  Ht 6' (1.829 m)  Wt 200 lb (90.719 kg)  BMI 27.12 kg/m2  SpO2 99%  Physical Exam  Nursing note and vitals reviewed. Constitutional: He is oriented to person, place, and time. He appears well-developed and well-nourished. No distress.  HENT:  Head: Normocephalic and atraumatic.  Right Ear: External ear normal.  Left Ear: External ear normal.  Nose: Nose normal.  Mouth/Throat: Oropharynx is clear and moist and mucous membranes are normal. No uvula swelling. No oropharyngeal exudate.  Eyes: EOM are normal.  Neck: Normal range of motion.  Cardiovascular: Normal rate, regular rhythm, normal heart sounds and intact distal pulses.   Pulmonary/Chest: Effort normal and breath sounds normal. No accessory muscle usage. Not tachypneic. No respiratory distress. He has no decreased breath sounds. He has no wheezes. He has no rhonchi. He  has no rales. He exhibits no tenderness.  Abdominal: Soft. He exhibits no distension. There is no tenderness.  Musculoskeletal:       Lumbar back: He exhibits decreased range of motion, tenderness and pain. He exhibits no bony tenderness, no swelling and no deformity.       Back:  Neurological: He is alert and oriented to person, place, and time.  Reflex Scores:      Patellar reflexes are 2+ on the right side and 2+ on the left side.      Achilles reflexes are 2+ on the right side and 2+ on the left side. Skin: Skin is warm and dry. He is not diaphoretic.  Psychiatric: He has a normal mood and affect. Judgment normal.    ED Course  Procedures (including critical care time)  Labs Reviewed - No  data to display No results found.   No diagnosis found.    MDM          Worthy Rancher, PA 03/20/11 [phone]

## 2011-03-20 NOTE — ED Notes (Signed)
Pt c/o lower back pain, states that he was moving furniture and his began to experience back pain afterwards, has hx of chronic back pain, states that this pain feels the same

## 2011-03-20 NOTE — ED Provider Notes (Signed)
Medical screening examination/treatment/procedure(s) were performed by non-physician practitioner and as supervising physician I was immediately available for consultation/collaboration.  Cierah Crader, MD 03/20/11 2047 

## 2011-03-20 NOTE — ED Notes (Signed)
Pt presents with flare up of chronic back pain after lifting furniture today. Pt ambulated to triage with cane.

## 2011-06-01 ENCOUNTER — Emergency Department (HOSPITAL_COMMUNITY): Admission: EM | Admit: 2011-06-01 | Discharge: 2011-06-01 | Discharge status: Against Medical Advice | Payer: Self-pay

## 2011-06-01 ENCOUNTER — Encounter (HOSPITAL_COMMUNITY): Payer: Self-pay | Admitting: *Deleted

## 2011-06-01 NOTE — ED Notes (Signed)
C/o lower back pain since he picked up the"back end of a truck this morning"

## 2011-06-01 NOTE — ED Notes (Signed)
Left AMA without informing staff of leaving

## 2011-06-03 ENCOUNTER — Encounter (HOSPITAL_COMMUNITY): Payer: Self-pay

## 2011-06-03 ENCOUNTER — Emergency Department (HOSPITAL_COMMUNITY)
Admission: EM | Admit: 2011-06-03 | Discharge: 2011-06-04 | Discharge status: Against Medical Advice | Payer: Medicaid Other | Attending: Emergency Medicine | Admitting: Emergency Medicine

## 2011-06-03 DIAGNOSIS — T424X4A Poisoning by benzodiazepines, undetermined, initial encounter: Secondary | ICD-10-CM | POA: Insufficient documentation

## 2011-06-03 DIAGNOSIS — T43591A Poisoning by other antipsychotics and neuroleptics, accidental (unintentional), initial encounter: Secondary | ICD-10-CM | POA: Insufficient documentation

## 2011-06-03 DIAGNOSIS — I10 Essential (primary) hypertension: Secondary | ICD-10-CM | POA: Insufficient documentation

## 2011-06-03 DIAGNOSIS — F319 Bipolar disorder, unspecified: Secondary | ICD-10-CM | POA: Insufficient documentation

## 2011-06-03 DIAGNOSIS — R0902 Hypoxemia: Secondary | ICD-10-CM | POA: Diagnosis present

## 2011-06-03 DIAGNOSIS — F411 Generalized anxiety disorder: Secondary | ICD-10-CM | POA: Insufficient documentation

## 2011-06-03 DIAGNOSIS — T424X1A Poisoning by benzodiazepines, accidental (unintentional), initial encounter: Secondary | ICD-10-CM

## 2011-06-03 DIAGNOSIS — F4310 Post-traumatic stress disorder, unspecified: Secondary | ICD-10-CM | POA: Insufficient documentation

## 2011-06-03 DIAGNOSIS — F1310 Sedative, hypnotic or anxiolytic abuse, uncomplicated: Secondary | ICD-10-CM | POA: Diagnosis present

## 2011-06-03 LAB — CBC
MCHC: 35.5 g/dL (ref 30.0–36.0)
Platelets: 351 10*3/uL (ref 150–400)
RDW: 12.6 % (ref 11.5–15.5)
WBC: 7.4 10*3/uL (ref 4.0–10.5)

## 2011-06-03 LAB — COMPREHENSIVE METABOLIC PANEL
ALT: 27 U/L (ref 0–53)
AST: 20 U/L (ref 0–37)
Albumin: 4 g/dL (ref 3.5–5.2)
CO2: 28 mEq/L (ref 19–32)
Calcium: 9.2 mg/dL (ref 8.4–10.5)
Chloride: 100 mEq/L (ref 96–112)
GFR calc non Af Amer: >90 mL/min (ref >90)
Sodium: 137 mEq/L (ref 135–145)

## 2011-06-03 LAB — RAPID URINE DRUG SCREEN, HOSP PERFORMED
Amphetamines: POSITIVE — AB
Barbiturates: NOT DETECTED
Cocaine: NOT DETECTED
Opiates: NOT DETECTED
Tetrahydrocannabinol: POSITIVE — AB

## 2011-06-03 LAB — DIFFERENTIAL
Basophils Absolute: 0 10*3/uL (ref 0.0–0.1)
Basophils Relative: 0 % (ref 0–1)
Lymphocytes Relative: 22 % (ref 12–46)
Neutro Abs: 5.1 10*3/uL (ref 1.7–7.7)

## 2011-06-03 MED ORDER — NALOXONE HCL 1 MG/ML IJ SOLN
1.0000 mg | Freq: Once | INTRAMUSCULAR | Status: DC
  Filled 2011-06-03: qty 2

## 2011-06-03 MED ORDER — SODIUM CHLORIDE 0.9 % IV SOLN
INTRAVENOUS | Status: DC
  Administered 2011-06-03: 20:00:00 via INTRAVENOUS

## 2011-06-03 MED ORDER — ACETAMINOPHEN 500 MG PO TABS
ORAL_TABLET | ORAL | Status: AC
  Filled 2011-06-03: qty 2

## 2011-06-03 MED ORDER — ACETAMINOPHEN 500 MG PO TABS: 1000.0000 mg | ORAL_TABLET | Freq: Once | ORAL | Status: DC

## 2011-06-03 NOTE — ED Notes (Signed)
Remains resting in bed with eyes closed and lights off. sats 96% on room air. 88 bpm, normal sinus. No distress. Equal chest rise and fall at 14x\minute. Sitter with patient. Will continue to monitor.

## 2011-06-03 NOTE — ED Notes (Signed)
Per EMS, called out for possible overdose of xanax. Pt was given 1 mg of narcan prior to arrival. Pt awake in ed. States he did not overdose on his xanax. States he has seizures and does not know what happened. When he woke up he was in the back of an ambulance.

## 2011-06-03 NOTE — ED Notes (Signed)
Per ems  

## 2011-06-03 NOTE — ED Notes (Signed)
Resting with eyes closed and lights off, regular, equal. No distress. Breathing 14x\minute. 96% on room air. Cop at bedside. In no distress.  Call bell within reach.

## 2011-06-03 NOTE — ED Notes (Signed)
Into room to attempt IV access. 

## 2011-06-03 NOTE — ED Notes (Signed)
Attempting to given urine specimen at this time. No distress. Equal chest rise and fall, regular, equal. sats 96% on room air. Will continue to monitor.

## 2011-06-03 NOTE — ED Notes (Signed)
Patient more calm. Now resting in bed comfortably. Denies any needs. Call bell within reach. [employer] at bedside.

## 2011-06-03 NOTE — ED Notes (Signed)
Patient ripping off cardiac monitor. Took out IV. No signs of infiltration and bleeding controlled at this time. More alert. States he is going home. MD made aware.

## 2011-06-03 NOTE — ED Notes (Signed)
Remains resting with eyes closed and lights off. No distress. Equal chest rise and fall. Pain 10\10 in back. Call bell at bedside. Sitter with patient.

## 2011-06-03 NOTE — ED Provider Notes (Signed)
This chart was scribed for [employer]. Rubin Payor, MD by Williemae Natter. The patient was seen in room [facility] at 7:19 PM.  CSN: [id number]  Arrival date & time 06/03/11  1900   First MD Initiated Contact with Patient 06/03/11 1913      Chief Complaint  Patient presents with  . Drug Overdose    (Consider location/radiation/quality/duration/timing/severity/associated sxs/prior Treatment) Level 5 Caveat due to the mental status of the patient  Patient is a 36 y.o. male presenting with Overdose. The history is provided by the patient and the EMS personnel.  Drug Overdose This is a new problem. The current episode started less than 1 hour ago. The problem occurs constantly. The problem has not changed since onset.The symptoms are aggravated by nothing. The symptoms are relieved by nothing. He has tried nothing for the symptoms.   Theodore Crawford is a 36 y.o. male who presents to the Emergency Department complaining of overdose. EMS called out for possible overdose of Xanax. Pt has history of seizures and reports having one pta. Pt cannot recall what happened.  Past Medical History  Diagnosis Date  . Leg pain, right   . Hypertension   . Depression   . Bipolar 1 disorder   . PTSD (post-traumatic stress disorder)   . Anxiety disorder   . Back pain     Past Surgical History  Procedure Date  . Chest tube insertion     No family history on file.  History  Substance Use Topics  . Smoking status: Current Everyday Smoker -- 1.0 packs/day  . Smokeless tobacco: Not on file  . Alcohol Use: No      Review of Systems  Unable to perform ROS: Mental status change    Allergies  Bee venom and Penicillins  Home Medications   Current Outpatient Rx  Name Route Sig Dispense Refill  . ALPRAZOLAM 1 MG PO TABS Oral Take 1 mg by mouth 4 (four) times daily as needed. For seizure    . ASPIRIN-ACETAMINOPHEN 500-325 MG PO PACK Oral Take 1 Package by mouth daily as needed. headache     .  GABAPENTIN 300 MG PO CAPS Oral Take 300 mg by mouth 3 (three) times daily.     Marland Kitchen PAROXETINE HCL 20 MG PO TABS Oral Take 60 mg by mouth every morning.        BP 106/65  Pulse 85  Temp(Src) 97.7 F (36.5 C) (Oral)  Resp 14  Ht 6' (1.829 m)  Wt 200 lb (90.719 kg)  BMI 27.12 kg/m2  SpO2 97%  Physical Exam  Nursing note and vitals reviewed. Constitutional: He appears well-developed and well-nourished.       Rt eyelid drooping Slurred speech  HENT:  Head: Normocephalic and atraumatic.  Eyes: Conjunctivae are normal. Pupils are equal, round, and reactive to light.  Neck: Normal range of motion. Neck supple.  Cardiovascular: Normal rate, regular rhythm and normal heart sounds.   Pulmonary/Chest: Effort normal and breath sounds normal.       Lungs clear  Abdominal: Soft. There is no tenderness.  Skin: Skin is warm and dry.    ED Course  Procedures (including critical care time) DIAGNOSTIC STUDIES: Oxygen Saturation is 94% on [place], adequate by my interpretation.    COORDINATION OF CARE:  Medications  0.9 %  sodium chloride infusion (0  Intravenous Stopped 06/03/11 2047)  naloxone [facility]) injection 1 mg (1 mg Intravenous Not Given 06/03/11 2047)  acetaminophen (TYLENOL) tablet 1,000 mg (not administered)  acetaminophen (TYLENOL) 500 MG tablet (not administered)      Labs Reviewed  COMPREHENSIVE METABOLIC PANEL - Abnormal; Notable for the following:    BUN 4 (*)    All other components within normal limits  ETHANOL - Abnormal; Notable for the following:    Alcohol, Ethyl (B) 15 (*)    All other components within normal limits  CBC  DIFFERENTIAL  URINE RAPID DRUG SCREEN (HOSP PERFORMED)   No results found.   1. Benzodiazepine overdose   2. Hypoxia       MDM  Patient with apparent benzodiazepine overdose. His sats dropped in the 80s twice here. He is required Narcan to bring it back up. Patient became belligerent and stated is currently. His wife states that he to  possibly 15 Xanax pills today. Patient will not admit this to me. Patient has been involuntarily committed to be admitted to the hospital  I personally performed the services described in this documentation, which was scribed in my presence. The recorded information has been reviewed and considered.          Juliet Rude. Rubin Payor, MD 06/03/11 2131

## 2011-06-03 NOTE — ED Notes (Signed)
Lab at bedside to draw blood. Given urinal for urine specimen at this time. Arrousable to verbal stimuli. Continuing to remind patient to wake up and breath since breathing slowing down to lower rate. Family with patient.

## 2011-06-03 NOTE — ED Notes (Signed)
Patient's girlfriend speaking with MD at this time.

## 2011-06-03 NOTE — ED Notes (Signed)
Patient denying taking any additional Xanax pills besides his prescribed four a day. Sats drop when patient takes off nasal cannula down to 90%. Respiratory rate drops to approximately 8x\minute. However, when arroused by verbal stimuli, patient wakes up and breaths 14x\minute. Family with patient. MD aware.

## 2011-06-03 NOTE — ED Notes (Signed)
Remains resting in bed sitting up. No distress. Breathing 14x\minute, regular, unlabored. Call bell and sitter at bedside. Call bell within reach. Denies needs.

## 2011-06-03 NOTE — ED Notes (Signed)
Patient falling asleep. Respiratory rate slows to 12x\minute. sats remain at 97% on 2L O2 [place]. MD aware.

## 2011-06-03 NOTE — ED Notes (Signed)
MD at bedside to evaluate. sats decreased to 84% when sleeping. aroused with verbal stimuli. A&Ox4. Placed on 2L O2 [place]. Clear lung sounds in all fields. Active bowel sounds in all fields. No tenderness in abdomen. Normal sinus on monitor. Drooping of right eye but states this is his normal. Pupils 2 mm bilaterally, slow to react to slight. PERRLA. Call bell within reach. Bed in low position and locked with side rails up.

## 2011-06-04 DIAGNOSIS — R0902 Hypoxemia: Secondary | ICD-10-CM | POA: Diagnosis present

## 2011-06-04 DIAGNOSIS — F1310 Sedative, hypnotic or anxiolytic abuse, uncomplicated: Secondary | ICD-10-CM | POA: Diagnosis present

## 2011-06-04 NOTE — ED Notes (Signed)
Patient's involuntary commitment paperwork receded by Venetia Constable, MD (hospitalist). Patient wanting to sign out AMA. Verified with hospitalist.
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
## 2011-06-04 NOTE — ED Notes (Signed)
Remains resting with eyes closed and lights off. No facial grimaces. Normal sinus on monitor. Call bell within reach. Sitter at bedside. Equal chest rise and fall.

## 2011-06-04 NOTE — ED Notes (Signed)
Patient states he wants to sign out AMA and does not want to stay. Consulting with hospitalist. Patient and patient's girlfriend states he did not overdose on Xanax, that he had a seizure. Patient very irritated.

## 2011-06-04 NOTE — ED Notes (Signed)
Hospitalist at bedside to evaluate. Patient A&O x 4. Call bell within reach. Bed in low position and locked with side rails up. No needs. Equal chest rise and fall, unlabored, regular.

## 2011-06-04 NOTE — ED Notes (Signed)
Patient states he wants his bottle of Xanax that he had in his pocket upon arrival to ED. Notified per day shift, that no bottle of medications were on patient at arrival. Verified with security. No patient belongings given to security. Ambulated to waiting room with girlfriend for discharge.

## 2011-06-04 NOTE — ED Notes (Signed)
Patient ambulatory to bathroom with sitter

## 2011-06-07 ENCOUNTER — Encounter (HOSPITAL_COMMUNITY): Payer: Self-pay

## 2011-06-07 ENCOUNTER — Emergency Department (HOSPITAL_COMMUNITY)
Admission: EM | Admit: 2011-06-07 | Discharge: 2011-06-07 | Disposition: A | Discharge status: Home / Self Care | Payer: Medicaid Other | Attending: Emergency Medicine | Admitting: Emergency Medicine

## 2011-06-07 DIAGNOSIS — M25569 Pain in unspecified knee: Secondary | ICD-10-CM | POA: Insufficient documentation

## 2011-06-07 DIAGNOSIS — F319 Bipolar disorder, unspecified: Secondary | ICD-10-CM | POA: Insufficient documentation

## 2011-06-07 DIAGNOSIS — M545 Low back pain, unspecified: Secondary | ICD-10-CM | POA: Insufficient documentation

## 2011-06-07 DIAGNOSIS — G8929 Other chronic pain: Secondary | ICD-10-CM | POA: Insufficient documentation

## 2011-06-07 DIAGNOSIS — I10 Essential (primary) hypertension: Secondary | ICD-10-CM | POA: Insufficient documentation

## 2011-06-07 MED ORDER — MORPHINE SULFATE 4 MG/ML IJ SOLN
8.0000 mg | Freq: Once | INTRAMUSCULAR | Status: AC
  Administered 2011-06-07: 8 mg via INTRAMUSCULAR
  Filled 2011-06-07: qty 2

## 2011-06-07 MED ORDER — METHOCARBAMOL 500 MG PO TABS
1000.0000 mg | ORAL_TABLET | Freq: Once | ORAL | Status: AC
  Administered 2011-06-07: 1000 mg via ORAL
  Filled 2011-06-07: qty 2

## 2011-06-07 NOTE — ED Provider Notes (Signed)
History     CSN: [id number]  Arrival date & time 06/07/11  1707   None     Chief Complaint  Patient presents with  . Knee Pain  . Back Pain    (Consider location/radiation/quality/duration/timing/severity/associated sxs/prior treatment) HPI Comments: Patient states he had a seizure on February 28. He feels that he reinjured his right knee. And injured his lower back. The patient has had on multiple visits for multiple problems with his right knee and his lower back. He has not had any loss of bowel or bladder function. He states he uses a cane at home to get around along with assistance from his son and girlfriend.  Patient is a 36 y.o. male presenting with knee pain and back pain. The history is provided by the patient.  Knee Pain  Back Pain     Past Medical History  Diagnosis Date  . Leg pain, right   . Hypertension   . Depression   . Bipolar 1 disorder   . PTSD (post-traumatic stress disorder)   . Anxiety disorder   . Back pain     Past Surgical History  Procedure Date  . Chest tube insertion     No family history on file.  History  Substance Use Topics  . Smoking status: Current Everyday Smoker -- 1.0 packs/day  . Smokeless tobacco: Not on file  . Alcohol Use: No      Review of Systems  Musculoskeletal: Positive for back pain.  Neurological: Positive for seizures.  Psychiatric/Behavioral: The patient is nervous/anxious.     Allergies  Bee venom and Penicillins  Home Medications   Current Outpatient Rx  Name Route Sig Dispense Refill  . ALPRAZOLAM 1 MG PO TABS Oral Take 1 mg by mouth 4 (four) times daily as needed. For seizure    . ASPIRIN-ACETAMINOPHEN 500-325 MG PO PACK Oral Take 1 Package by mouth daily as needed. headache     . GABAPENTIN 300 MG PO CAPS Oral Take 300 mg by mouth 3 (three) times daily.     Marland Kitchen PAROXETINE HCL 20 MG PO TABS Oral Take 60 mg by mouth every morning.        BP 128/80  Pulse 81  Temp(Src) 97.8 F (36.6 C) (Oral)   Resp 20  Ht 6' (1.829 m)  Wt 200 lb (90.719 kg)  BMI 27.12 kg/m2  SpO2 100%  Physical Exam  Nursing note and vitals reviewed. Constitutional: He is oriented to person, place, and time. He appears well-developed and well-nourished.  Non-toxic appearance.  HENT:  Head: Normocephalic.  Right Ear: Tympanic membrane and external ear normal.  Left Ear: Tympanic membrane and external ear normal.  Eyes: EOM and lids are normal. Pupils are equal, round, and reactive to light.  Neck: Normal range of motion. Neck supple. Carotid bruit is not present.  Cardiovascular: Normal rate, regular rhythm, normal heart sounds, intact distal pulses and normal pulses.   Pulmonary/Chest: Breath sounds normal. No respiratory distress.  Abdominal: Soft. Bowel sounds are normal. There is no tenderness. There is no guarding.  Musculoskeletal: Normal range of motion.       Pain to the lower back to palpation and with attempted range of motion. No palpable deformity. No bruising noted.  There is pain of the right knee with attempted range of motion. There is no posterior mass appreciated. There is no effusion present. There is no deformity of the quadriceps area. The distal pulses are symmetrical.  Lymphadenopathy:  Head (right side): No submandibular adenopathy present.       Head (left side): No submandibular adenopathy present.    He has no cervical adenopathy.  Neurological: He is alert and oriented to person, place, and time. He has normal strength. No cranial nerve deficit or sensory deficit.  Skin: Skin is warm and dry.  Psychiatric: He has a normal mood and affect. His speech is normal.    ED Course  Procedures (including critical care time)  Labs Reviewed - No data to display No results found.   1. Back pain, chronic   2. Chronic neck pain       MDM  I have reviewed nursing notes, vital signs, and all appropriate lab and imaging results for this patient. Patient states that he has  seizures and that he thinks you're one of his seizures he reinjured the right knee and the lower back. The patient has a history of chronic knee and back problems. Review of the medical records also reveals benzodiazepine overdose, and alcohol abuse. The patient was treated in the emergency department with intramuscular morphine and oral Robaxin. No prescriptions given at this time. We will leave this to the discussed with psych MDs.       Kathie Dike, [place] 06/07/11 [phone]

## 2011-06-07 NOTE — ED Notes (Signed)
Pt presents with right knee pain and low back pain after having sz on 06/04/2011.

## 2011-06-07 NOTE — Discharge Instructions (Signed)

## 2011-06-09 NOTE — ED Provider Notes (Signed)
Medical screening examination/treatment/procedure(s) were performed by non-physician practitioner and as supervising physician I was immediately available for consultation/collaboration.   [name], MD 06/09/11 [phone]

## 2011-07-26 ENCOUNTER — Emergency Department (HOSPITAL_COMMUNITY): Payer: Medicaid Other

## 2011-07-26 ENCOUNTER — Emergency Department (HOSPITAL_COMMUNITY)
Admission: EM | Admit: 2011-07-26 | Discharge: 2011-07-26 | Disposition: A | Discharge status: Home / Self Care | Payer: Medicaid Other | Attending: Emergency Medicine | Admitting: Emergency Medicine

## 2011-07-26 ENCOUNTER — Encounter (HOSPITAL_COMMUNITY): Payer: Self-pay | Admitting: Emergency Medicine

## 2011-07-26 DIAGNOSIS — F319 Bipolar disorder, unspecified: Secondary | ICD-10-CM | POA: Insufficient documentation

## 2011-07-26 DIAGNOSIS — X500XXA Overexertion from strenuous movement or load, initial encounter: Secondary | ICD-10-CM | POA: Insufficient documentation

## 2011-07-26 DIAGNOSIS — M549 Dorsalgia, unspecified: Secondary | ICD-10-CM

## 2011-07-26 DIAGNOSIS — I10 Essential (primary) hypertension: Secondary | ICD-10-CM | POA: Insufficient documentation

## 2011-07-26 DIAGNOSIS — F17200 Nicotine dependence, unspecified, uncomplicated: Secondary | ICD-10-CM | POA: Insufficient documentation

## 2011-07-26 MED ORDER — OXYCODONE-ACETAMINOPHEN 5-325 MG PO TABS: 1.0000 | ORAL_TABLET | Freq: Four times a day (QID) | ORAL | Status: AC | PRN

## 2011-07-26 MED ORDER — CARISOPRODOL 350 MG PO TABS: 350.0000 mg | ORAL_TABLET | Freq: Three times a day (TID) | ORAL | Status: AC

## 2011-07-26 MED ORDER — OXYCODONE-ACETAMINOPHEN 5-325 MG PO TABS
2.0000 | ORAL_TABLET | Freq: Once | ORAL | Status: AC
  Administered 2011-07-26: 2 via ORAL
  Filled 2011-07-26: qty 2
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
## 2011-07-26 NOTE — Discharge Instructions (Signed)
Back Pain, Adult Low back pain is very common. About 1 in 5 people have back pain.The cause of low back pain is rarely dangerous. The pain often gets better over time.About half of people with a sudden onset of back pain feel better in just 2 weeks. About 8 in 10 people feel better by 6 weeks.  CAUSES Some common causes of back pain include:  Strain of the muscles or ligaments supporting the spine.   Wear and tear (degeneration) of the spinal discs.   Arthritis.   Direct injury to the back.  DIAGNOSIS Most of the time, the direct cause of low back pain is not known.However, back pain can be treated effectively even when the exact cause of the pain is unknown.Answering your caregiver's questions about your overall health and symptoms is one of the most accurate ways to make sure the cause of your pain is not dangerous. If your caregiver needs more information, he or she may order lab work or imaging tests (X-rays or MRIs).However, even if imaging tests show changes in your back, this usually does not require surgery. HOME CARE INSTRUCTIONS For many people, back pain returns.Since low back pain is rarely dangerous, it is often a condition that people can learn to manageon their own.   Remain active. It is stressful on the back to sit or stand in one place. Do not sit, drive, or stand in one place for more than 30 minutes at a time. Take short walks on level surfaces as soon as pain allows.Try to increase the length of time you walk each day.   Do not stay in bed.Resting more than 1 or 2 days can delay your recovery.   Do not avoid exercise or work.Your body is made to move.It is not dangerous to be active, even though your back may hurt.Your back will likely heal faster if you return to being active before your pain is gone.   Pay attention to your body when you bend and lift. Many people have less discomfortwhen lifting if they bend their knees, keep the load close to their  bodies,and avoid twisting. Often, the most comfortable positions are those that put less stress on your recovering back.   Find a comfortable position to sleep. Use a firm mattress and lie on your side with your knees slightly bent. If you lie on your back, put a pillow under your knees.   Only take over-the-counter or prescription medicines as directed by your caregiver. Over-the-counter medicines to reduce pain and inflammation are often the most helpful.Your caregiver may prescribe muscle relaxant drugs.These medicines help dull your pain so you can more quickly return to your normal activities and healthy exercise.   Put ice on the injured area.   Put ice in a plastic bag.   Place a towel between your skin and the bag.   Leave the ice on for 15 to 20 minutes, 3 to 4 times a day for the first 2 to 3 days. After that, ice and heat may be alternated to reduce pain and spasms.   Ask your caregiver about trying back exercises and gentle massage. This may be of some benefit.   Avoid feeling anxious or stressed.Stress increases muscle tension and can worsen back pain.It is important to recognize when you are anxious or stressed and learn ways to manage it.Exercise is a great option.  SEEK MEDICAL CARE IF:  You have pain that is not relieved with rest or medicine.   You have   pain that does not improve in 1 week.   You have new symptoms.   You are generally not feeling well.  SEEK IMMEDIATE MEDICAL CARE IF:   You have pain that radiates from your back into your legs.   You develop new bowel or bladder control problems.   You have unusual weakness or numbness in your arms or legs.   You develop nausea or vomiting.   You develop abdominal pain.   You feel faint.  Document Released: 03/23/2005 Document Revised: 03/12/2011 Document Reviewed: 08/11/2010 [employer] Patient Information 2012 [employer]. 

## 2011-07-26 NOTE — ED Notes (Signed)
MD at bedside. 

## 2011-07-26 NOTE — ED Notes (Signed)
Patient transported to X-ray 

## 2011-10-01 ENCOUNTER — Encounter (HOSPITAL_COMMUNITY): Payer: Self-pay | Admitting: Emergency Medicine

## 2011-10-01 ENCOUNTER — Emergency Department (HOSPITAL_COMMUNITY)
Admission: EM | Admit: 2011-10-01 | Discharge: 2011-10-02 | Disposition: A | Discharge status: Home / Self Care | Payer: Medicaid Other | Attending: Emergency Medicine | Admitting: Emergency Medicine

## 2011-10-01 DIAGNOSIS — IMO0002 Reserved for concepts with insufficient information to code with codable children: Secondary | ICD-10-CM | POA: Insufficient documentation

## 2011-10-01 DIAGNOSIS — S52609A Unspecified fracture of lower end of unspecified ulna, initial encounter for closed fracture: Secondary | ICD-10-CM | POA: Insufficient documentation

## 2011-10-01 DIAGNOSIS — T07XXXA Unspecified multiple injuries, initial encounter: Secondary | ICD-10-CM

## 2011-10-01 DIAGNOSIS — M542 Cervicalgia: Secondary | ICD-10-CM | POA: Insufficient documentation

## 2011-10-01 DIAGNOSIS — I10 Essential (primary) hypertension: Secondary | ICD-10-CM | POA: Insufficient documentation

## 2011-10-01 DIAGNOSIS — S0280XA Fracture of other specified skull and facial bones, unspecified side, initial encounter for closed fracture: Secondary | ICD-10-CM | POA: Insufficient documentation

## 2011-10-01 DIAGNOSIS — S52613A Displaced fracture of unspecified ulna styloid process, initial encounter for closed fracture: Secondary | ICD-10-CM

## 2011-10-01 DIAGNOSIS — S0292XA Unspecified fracture of facial bones, initial encounter for closed fracture: Secondary | ICD-10-CM

## 2011-10-01 DIAGNOSIS — S0003XA Contusion of scalp, initial encounter: Secondary | ICD-10-CM | POA: Insufficient documentation

## 2011-10-01 LAB — CBC WITH DIFFERENTIAL/PLATELET
Eosinophils Absolute: 0.7 10*3/uL (ref 0.0–0.7)
Eosinophils Relative: 8 % — ABNORMAL HIGH (ref 0–5)
HCT: 44.6 % (ref 39.0–52.0)
Hemoglobin: 15.7 g/dL (ref 13.0–17.0)
Lymphs Abs: 2.6 10*3/uL (ref 0.7–4.0)
MCH: 32.7 pg (ref 26.0–34.0)
MCV: 92.9 fL (ref 78.0–100.0)
Monocytes Absolute: 0.5 10*3/uL (ref 0.1–1.0)
Monocytes Relative: 6 % (ref 3–12)
Neutrophils Relative %: 56 % (ref 43–77)
RBC: 4.8 MIL/uL (ref 4.22–5.81)

## 2011-10-01 LAB — POCT I-STAT, CHEM 8
BUN: 4 mg/dL — ABNORMAL LOW (ref 6–23)
Creatinine, Ser: 1.1 mg/dL (ref 0.50–1.35)
Glucose, Bld: 101 mg/dL — ABNORMAL HIGH (ref 70–99)
Sodium: 139 mEq/L (ref 135–145)
TCO2: 26 mmol/L (ref 0–100)
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
## 2011-10-02 ENCOUNTER — Emergency Department (HOSPITAL_COMMUNITY): Payer: Medicaid Other

## 2011-10-02 ENCOUNTER — Encounter (HOSPITAL_COMMUNITY): Payer: Self-pay | Admitting: Radiology

## 2011-10-02 DIAGNOSIS — T07XXXA Unspecified multiple injuries, initial encounter: Secondary | ICD-10-CM

## 2011-10-02 DIAGNOSIS — S0280XA Fracture of other specified skull and facial bones, unspecified side, initial encounter for closed fracture: Secondary | ICD-10-CM

## 2011-10-02 MED ORDER — IOHEXOL 300 MG/ML  SOLN
100.0000 mL | Freq: Once | INTRAMUSCULAR | Status: AC | PRN
  Administered 2011-10-02: 100 mL via INTRAVENOUS

## 2011-10-02 MED ORDER — IBUPROFEN 600 MG PO TABS: 600.0000 mg | ORAL_TABLET | Freq: Four times a day (QID) | ORAL | Status: AC | PRN

## 2011-10-02 MED ORDER — CLINDAMYCIN PHOSPHATE 600 MG/50ML IV SOLN
600.0000 mg | Freq: Once | INTRAVENOUS | Status: AC
  Administered 2011-10-02: 600 mg via INTRAVENOUS
  Filled 2011-10-02: qty 50

## 2011-10-02 MED ORDER — KETOROLAC TROMETHAMINE 30 MG/ML IJ SOLN
30.0000 mg | Freq: Once | INTRAMUSCULAR | Status: AC
  Administered 2011-10-02: 30 mg via INTRAVENOUS
  Filled 2011-10-02: qty 1

## 2011-10-02 NOTE — ED Notes (Signed)
Pt remains in ct scanner

## 2011-10-02 NOTE — ED Notes (Signed)
Pt lethargic, pt woke up and told to call for ride home.

## 2011-10-02 NOTE — Discharge Instructions (Signed)
Abrasions Abrasions are skin scrapes. Their treatment depends on how large and deep the abrasion is. Abrasions do not extend through all layers of the skin. A cut or lesion through all skin layers is called a laceration. HOME CARE INSTRUCTIONS   If you were given a dressing, change it at least once a day or as instructed by your caregiver. If the bandage sticks, soak it off with a solution of water or hydrogen peroxide.   Twice a day, wash the area with soap and water to remove all the cream/ointment. You may do this in a sink, under a tub faucet, or in a shower. Rinse off the soap and pat dry with a clean towel. Look for signs of infection (see below).   Reapply cream/ointment according to your caregiver's instruction. This will help prevent infection and keep the bandage from sticking. Telfa or gauze over the wound and under the dressing or wrap will also help keep the bandage from sticking.   If the bandage becomes wet, dirty, or develops a foul smell, change it as soon as possible.   Only take over-the-counter or prescription medicines for pain, discomfort, or fever as directed by your caregiver.  SEEK IMMEDIATE MEDICAL CARE IF:   Increasing pain in the wound.   Signs of infection develop: redness, swelling, surrounding area is tender to touch, or pus coming from the wound.   You have a fever.   Any foul smell coming from the wound or dressing.  Most skin wounds heal within ten days. Facial wounds heal faster. However, an infection may occur despite proper treatment. You should have the wound checked for signs of infection within 24 to 48 hours or sooner if problems arise. If you were not given a wound-check appointment, look closely at the wound yourself on the second day for early signs of infection listed above. MAKE SURE YOU:   Understand these instructions.   Will watch your condition.   Will get help right away if you are not doing well or get worse.  Document Released:  12/31/2004 Document Revised: 03/12/2011 Document Reviewed: 02/24/2011 [employer] Patient Information 2012 [employer]. 

## 2011-10-02 NOTE — ED Notes (Signed)
The pt rouses with minimal difficulty.  He is oriented says he has a headache.  His lt pupil is 4.0 and the rt is a 3.0.  Moving all extremities.  His c-collar remains in place

## 2011-10-02 NOTE — ED Notes (Signed)
Pt sleeping awaiting consult
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
## 2011-10-02 NOTE — ED Notes (Signed)
Ortho tech at bedside 
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
## 2011-10-02 NOTE — ED Notes (Signed)
Charge nurse/morgan  Stated we didn't need urine sample.  Patient is being discharged.

## 2011-10-02 NOTE — ED Notes (Signed)
Ambulated in hallway,

## 2011-10-02 NOTE — ED Notes (Signed)
Pt sleeping awaiting consult 

## 2011-10-02 NOTE — ED Notes (Signed)
Ortho tech paged to room 16

## 2011-10-02 NOTE — ED Notes (Signed)
Dr wyatt at the bedside 

## 2011-10-02 NOTE — ED Notes (Signed)
The edp has checked the pt.  Waiting for the ent doctor

## 2011-10-03 ENCOUNTER — Encounter (HOSPITAL_COMMUNITY): Payer: Self-pay

## 2011-10-03 ENCOUNTER — Emergency Department (HOSPITAL_COMMUNITY)
Admission: EM | Admit: 2011-10-03 | Discharge: 2011-10-03 | Disposition: A | Discharge status: Home / Self Care | Payer: Medicaid Other | Attending: Emergency Medicine | Admitting: Emergency Medicine

## 2011-10-03 DIAGNOSIS — F4310 Post-traumatic stress disorder, unspecified: Secondary | ICD-10-CM | POA: Insufficient documentation

## 2011-10-03 DIAGNOSIS — F17200 Nicotine dependence, unspecified, uncomplicated: Secondary | ICD-10-CM | POA: Insufficient documentation

## 2011-10-03 DIAGNOSIS — S0292XA Unspecified fracture of facial bones, initial encounter for closed fracture: Secondary | ICD-10-CM

## 2011-10-03 DIAGNOSIS — F319 Bipolar disorder, unspecified: Secondary | ICD-10-CM | POA: Insufficient documentation

## 2011-10-03 DIAGNOSIS — I10 Essential (primary) hypertension: Secondary | ICD-10-CM | POA: Insufficient documentation

## 2011-10-03 DIAGNOSIS — Z043 Encounter for examination and observation following other accident: Secondary | ICD-10-CM | POA: Insufficient documentation

## 2011-10-03 DIAGNOSIS — T148XXA Other injury of unspecified body region, initial encounter: Secondary | ICD-10-CM

## 2011-10-03 MED ORDER — HYDROCODONE-ACETAMINOPHEN 5-325 MG PO TABS
2.0000 | ORAL_TABLET | Freq: Once | ORAL | Status: AC
  Administered 2011-10-03: 2 via ORAL
  Filled 2011-10-03: qty 2

## 2011-10-03 MED ORDER — BACITRACIN ZINC 500 UNIT/GM EX OINT
TOPICAL_OINTMENT | Freq: Once | CUTANEOUS | Status: DC
  Filled 2011-10-03 (×2): qty 0.9

## 2011-10-03 MED ORDER — HYDROCODONE-ACETAMINOPHEN 5-500 MG PO TABS: 1.0000 | ORAL_TABLET | Freq: Four times a day (QID) | ORAL | Status: AC | PRN

## 2011-10-03 NOTE — Discharge Instructions (Signed)
Take motrin as need for pain. You may also take vicodin as need for pain. No driving for the next 6 hours or when taking vicodin. Also, do not take tylenol or acetaminophen containing medication when taking vicodin. Follow up with maxillofacial specialist, and orthopedic specialist as planned in coming week.  Keep abrasions very clean, wash with soap and water 1-2x/day. You may apply a thin coat of bacitracin or neosporin to area as need for the next couple days. Return to ER if worse, worsening or severe eye pain, change in vision, infection of wound, fevers, other concern.      Facial Fracture A facial fracture is a break in one of the bones of your face. HOME CARE INSTRUCTIONS   Protect the injured part of your face until it is healed.   Do not participate in activities which give chance for re-injury until your doctor approves.   Gently wash and dry your face.   Wear head and facial protection while riding a bicycle, motorcycle, or snowmobile.  SEEK MEDICAL CARE IF:   An oral temperature above 102 F (38.9 C) develops.   You have severe headaches or notice changes in your vision.   You have new numbness or tingling in your face.   You develop nausea (feeling sick to your stomach), vomiting or a stiff neck.  SEEK IMMEDIATE MEDICAL CARE IF:   You develop difficulty seeing or experience double vision.   You become dizzy, lightheaded, or faint.   You develop trouble speaking, breathing, or swallowing.   You have a watery discharge from your nose or ear.  MAKE SURE YOU:   Understand these instructions.   Will watch your condition.   Will get help right away if you are not doing well or get worse.  Document Released: 03/23/2005 Document Revised: 03/12/2011 Document Reviewed: 11/10/2007 [facility] Patient Information 2012 [place], [place].    Abrasions Abrasions are skin scrapes. Their treatment depends on how large and deep the abrasion is. Abrasions do not extend  through all layers of the skin. A cut or lesion through all skin layers is called a laceration. HOME CARE INSTRUCTIONS   If you were given a dressing, change it at least once a day or as instructed by your caregiver. If the bandage sticks, soak it off with a solution of water or hydrogen peroxide.   Twice a day, wash the area with soap and water to remove all the cream/ointment. You may do this in a sink, under a tub faucet, or in a shower. Rinse off the soap and pat dry with a clean towel. Look for signs of infection (see below).   Reapply cream/ointment according to your caregiver's instruction. This will help prevent infection and keep the bandage from sticking. Telfa or gauze over the wound and under the dressing or wrap will also help keep the bandage from sticking.   If the bandage becomes wet, dirty, or develops a foul smell, change it as soon as possible.   Only take over-the-counter or prescription medicines for pain, discomfort, or fever as directed by your caregiver.  SEEK IMMEDIATE MEDICAL CARE IF:   Increasing pain in the wound.   Signs of infection develop: redness, swelling, surrounding area is tender to touch, or pus coming from the wound.   You have a fever.   Any foul smell coming from the wound or dressing.  Most skin wounds heal within ten days. Facial wounds heal faster. However, an infection may occur despite proper treatment. You  should have the wound checked for signs of infection within 24 to 48 hours or sooner if problems arise. If you were not given a wound-check appointment, look closely at the wound yourself on the second day for early signs of infection listed above. MAKE SURE YOU:   Understand these instructions.   Will watch your condition.   Will get help right away if you are not doing well or get worse.  Document Released: 12/31/2004 Document Revised: 03/12/2011 Document Reviewed: 02/24/2011 [facility] Patient Information 2012 [place],  [place].    Cryotherapy Cryotherapy means treatment with cold. Ice or gel packs can be used to reduce both pain and swelling. Ice is the most helpful within the first 24 to 48 hours after an injury or flareup from overusing a muscle or joint. Sprains, strains, spasms, burning pain, shooting pain, and aches can all be eased with ice. Ice can also be used when recovering from surgery. Ice is effective, has very few side effects, and is safe for most people to use. PRECAUTIONS  Ice is not a safe treatment option for people with:  Raynaud's phenomenon. This is a condition affecting small blood vessels in the extremities. Exposure to cold may cause your problems to return.   Cold hypersensitivity. There are many forms of cold hypersensitivity, including:   Cold urticaria. Red, itchy hives appear on the skin when the tissues begin to warm after being iced.   Cold erythema. This is a red, itchy rash caused by exposure to cold.   Cold hemoglobinuria. Red blood cells break down when the tissues begin to warm after being iced. The hemoglobin that carry oxygen are passed into the urine because they cannot combine with blood proteins fast enough.   Numbness or altered sensitivity in the area being iced.  If you have any of the following conditions, do not use ice until you have discussed cryotherapy with your caregiver:  Heart conditions, such as arrhythmia, angina, or chronic heart disease.   High blood pressure.   Healing wounds or open skin in the area being iced.   Current infections.   Rheumatoid arthritis.   Poor circulation.   Diabetes.  Ice slows the blood flow in the region it is applied. This is beneficial when trying to stop inflamed tissues from spreading irritating chemicals to surrounding tissues. However, if you expose your skin to cold temperatures for too long or without the proper protection, you can damage your skin or nerves. Watch for signs of skin damage due to cold. HOME  CARE INSTRUCTIONS Follow these tips to use ice and cold packs safely.  Place a dry or damp towel between the ice and skin. A damp towel will cool the skin more quickly, so you may need to shorten the time that the ice is used.   For a more rapid response, add gentle compression to the ice.   Ice for no more than 10 to 20 minutes at a time. The bonier the area you are icing, the less time it will take to get the benefits of ice.   Check your skin after 5 minutes to make sure there are no signs of a poor response to cold or skin damage.   Rest 20 minutes or more in between uses.   Once your skin is numb, you can end your treatment. You can test numbness by very lightly touching your skin. The touch should be so light that you do not see the skin dimple from the pressure of your  fingertip. When using ice, most people will feel these normal sensations in this order: cold, burning, aching, and numbness.   Do not use ice on someone who cannot communicate their responses to pain, such as small children or people with dementia.  HOW TO MAKE AN ICE PACK Ice packs are the most common way to use ice therapy. Other methods include ice massage, ice baths, and cryo-sprays. Muscle creams that cause a cold, tingly feeling do not offer the same benefits that ice offers and should not be used as a substitute unless recommended by your caregiver. To make an ice pack, do one of the following:  Place crushed ice or a bag of frozen vegetables in a sealable plastic bag. Squeeze out the excess air. Place this bag inside another plastic bag. Slide the bag into a pillowcase or place a damp towel between your skin and the bag.   Mix 3 parts water with 1 part rubbing alcohol. Freeze the mixture in a sealable plastic bag. When you remove the mixture from the freezer, it will be slushy. Squeeze out the excess air. Place this bag inside another plastic bag. Slide the bag into a pillowcase or place a damp towel between your  skin and the bag.  SEEK MEDICAL CARE IF:  You develop white spots on your skin. This may give the skin a blotchy (mottled) appearance.   Your skin turns blue or pale.   Your skin becomes waxy or hard.   Your swelling gets worse.  MAKE SURE YOU:   Understand these instructions.   Will watch your condition.   Will get help right away if you are not doing well or get worse.  Document Released: 11/17/2010 Document Revised: 03/12/2011 Document Reviewed: 11/17/2010 [facility] Patient Information 2012 [place], [place].
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
## 2011-10-08 ENCOUNTER — Emergency Department (HOSPITAL_COMMUNITY)
Admission: EM | Admit: 2011-10-08 | Discharge: 2011-10-08 | Disposition: A | Discharge status: Home / Self Care | Payer: Medicaid Other | Attending: Emergency Medicine | Admitting: Emergency Medicine

## 2011-10-08 ENCOUNTER — Encounter (HOSPITAL_COMMUNITY): Payer: Self-pay | Admitting: *Deleted

## 2011-10-08 DIAGNOSIS — Z043 Encounter for examination and observation following other accident: Secondary | ICD-10-CM | POA: Insufficient documentation

## 2011-10-08 DIAGNOSIS — F17200 Nicotine dependence, unspecified, uncomplicated: Secondary | ICD-10-CM | POA: Insufficient documentation

## 2011-10-08 DIAGNOSIS — I10 Essential (primary) hypertension: Secondary | ICD-10-CM | POA: Insufficient documentation

## 2011-10-08 DIAGNOSIS — F319 Bipolar disorder, unspecified: Secondary | ICD-10-CM | POA: Insufficient documentation

## 2011-10-08 DIAGNOSIS — F4310 Post-traumatic stress disorder, unspecified: Secondary | ICD-10-CM | POA: Insufficient documentation

## 2011-10-08 MED ORDER — OXYCODONE-ACETAMINOPHEN 5-325 MG PO TABS: 1.0000 | ORAL_TABLET | ORAL | Status: AC | PRN

## 2011-10-08 MED ORDER — HYDROCODONE-ACETAMINOPHEN 5-325 MG PO TABS: 1.0000 | ORAL_TABLET | ORAL | Status: DC | PRN
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
## 2011-10-12 NOTE — ED Provider Notes (Signed)
Medical screening examination/treatment/procedure(s) were performed by non-physician practitioner and as supervising physician I was immediately available for consultation/collaboration. Liora Myles, MD, FACEP   Kierre Hintz L Johnette Teigen, MD 10/12/11 2110 

## 2011-10-30 ENCOUNTER — Emergency Department (HOSPITAL_COMMUNITY)
Admission: EM | Admit: 2011-10-30 | Discharge: 2011-10-30 | Disposition: A | Discharge status: Home / Self Care | Payer: Medicaid Other | Attending: Emergency Medicine | Admitting: Emergency Medicine

## 2011-10-30 ENCOUNTER — Encounter (HOSPITAL_COMMUNITY): Payer: Self-pay

## 2011-10-30 DIAGNOSIS — K0889 Other specified disorders of teeth and supporting structures: Secondary | ICD-10-CM

## 2011-10-30 DIAGNOSIS — F17200 Nicotine dependence, unspecified, uncomplicated: Secondary | ICD-10-CM | POA: Insufficient documentation

## 2011-10-30 DIAGNOSIS — I10 Essential (primary) hypertension: Secondary | ICD-10-CM | POA: Insufficient documentation

## 2011-10-30 DIAGNOSIS — K029 Dental caries, unspecified: Secondary | ICD-10-CM | POA: Insufficient documentation

## 2011-10-30 DIAGNOSIS — F4310 Post-traumatic stress disorder, unspecified: Secondary | ICD-10-CM | POA: Insufficient documentation

## 2011-10-30 DIAGNOSIS — F319 Bipolar disorder, unspecified: Secondary | ICD-10-CM | POA: Insufficient documentation

## 2011-10-30 MED ORDER — OXYCODONE-ACETAMINOPHEN 5-325 MG PO TABS: 1.0000 | ORAL_TABLET | ORAL | Status: AC | PRN

## 2011-10-30 MED ORDER — CLINDAMYCIN HCL 300 MG PO CAPS: ORAL_CAPSULE | ORAL | Status: DC

## 2011-10-30 NOTE — ED Provider Notes (Signed)
History     CSN: [id number]  Arrival date & time 10/30/11  1529   First MD Initiated Contact with Patient 10/30/11 1543      Chief Complaint  Patient presents with  . Dental Pain    (Consider location/radiation/quality/duration/timing/severity/associated sxs/prior treatment) Patient is a 36 y.o. male presenting with tooth pain. The history is provided by the patient.  Dental PainThe primary symptoms include mouth pain. Primary symptoms do not include headaches, fever, shortness of breath, sore throat, angioedema or cough. The symptoms began 3 to 5 days ago. The symptoms are worsening. The symptoms are new. The symptoms occur constantly.  Affected locations include: teeth and gum(s).  Additional symptoms include: dental sensitivity to temperature and gum tenderness. Additional symptoms do not include: gum swelling, purulent gums, trismus, facial swelling, trouble swallowing, pain with swallowing, drooling and swollen glands. Medical issues include: smoking and periodontal disease.    Past Medical History  Diagnosis Date  . Leg pain, right   . Hypertension   . Depression   . Bipolar 1 disorder   . PTSD (post-traumatic stress disorder)   . Anxiety disorder   . Back pain     Past Surgical History  Procedure Date  . Chest tube insertion     No family history on file.  History  Substance Use Topics  . Smoking status: Current Everyday Smoker -- 1.0 packs/day  . Smokeless tobacco: Not on file  . Alcohol Use: Yes      Review of Systems  Constitutional: Negative for fever and appetite change.  HENT: Positive for dental problem. Negative for congestion, sore throat, facial swelling, drooling, trouble swallowing, neck pain and neck stiffness.   Eyes: Negative for pain and visual disturbance.  Respiratory: Negative for cough and shortness of breath.   Neurological: Negative for dizziness, facial asymmetry and headaches.  Hematological: Negative for adenopathy.  All other  systems reviewed and are negative.    Allergies  Amoxicillin; Bee venom; and Penicillins  Home Medications   Current Outpatient Rx  Name Route Sig Dispense Refill  . ALPRAZOLAM 1 MG PO TABS Oral Take 1 mg by mouth 4 (four) times daily as needed. For seizure    . ASPIRIN-ACETAMINOPHEN 500-325 MG PO PACK Oral Take 1 Package by mouth daily as needed. headache     . PAROXETINE HCL 20 MG PO TABS Oral Take 60 mg by mouth every morning.        BP 108/78  Pulse 104  Temp 98.6 F (37 C) (Oral)  Resp 20  Ht 6' (1.829 m)  Wt 175 lb (79.379 kg)  BMI 23.73 kg/m2  SpO2 96%  Physical Exam  Nursing note and vitals reviewed. Constitutional: He is oriented to person, place, and time. He appears well-developed and well-nourished. No distress.  HENT:  Head: Normocephalic and atraumatic. No trismus in the jaw.  Mouth/Throat: Uvula is midline, oropharynx is clear and moist and mucous membranes are normal. Dental caries present. No dental abscesses or uvula swelling.       Multiple dental caries.  No obvious periapical abscess.  No facial edema or trismus  Neck: Normal range of motion. Neck supple.  Cardiovascular: Normal rate, regular rhythm and normal heart sounds.   No murmur heard. Pulmonary/Chest: Effort normal and breath sounds normal.  Musculoskeletal: Normal range of motion.  Lymphadenopathy:    He has no cervical adenopathy.  Neurological: He is alert and oriented to person, place, and time. He exhibits normal muscle tone. Coordination normal.  Skin:  Skin is warm and dry.    ED Course  Procedures (including critical care time)  Labs Reviewed - No data to display      MDM    Multiple dental caries.  No obvious dental abscess.    The patient appears reasonably screened and/or stabilized for discharge and I doubt any other medical condition or other [facility] requiring further screening, evaluation, or treatment in the ED at this time prior to discharge.    Prescribed: Clindamycin Percocet #10      Theodore Crawford L. Jerlyn Pain, [place] 11/04/11 1626

## 2011-10-30 NOTE — ED Notes (Signed)
Pt with right lower jaw pain due to poor dental hygiene, states that his grandmother was working on getting an appointment with a dentist

## 2011-10-30 NOTE — ED Notes (Signed)
Pt provided with list of dental services in the area

## 2011-10-30 NOTE — ED Notes (Signed)
Pt reports that one of his teeth on the lower rt is abscessed.  Pt reports severe pain to the area.  Pt denies any fever at home.

## 2011-11-05 NOTE — ED Provider Notes (Signed)
Medical screening examination/treatment/procedure(s) were performed by non-physician practitioner and as supervising physician I was immediately available for consultation/collaboration.  Ameera Tigue, MD 11/05/11 0726 

## 2011-11-11 ENCOUNTER — Ambulatory Visit (HOSPITAL_COMMUNITY)
Admission: RE | Admit: 2011-11-11 | Discharge: 2011-11-11 | Disposition: A | Discharge status: Home / Self Care | Payer: Medicaid Other | Source: Ambulatory Visit | Attending: Family Medicine | Admitting: Family Medicine

## 2011-11-11 ENCOUNTER — Other Ambulatory Visit (HOSPITAL_COMMUNITY): Payer: Self-pay | Admitting: Family Medicine

## 2011-11-11 DIAGNOSIS — M545 Low back pain, unspecified: Secondary | ICD-10-CM | POA: Insufficient documentation

## 2011-11-11 DIAGNOSIS — M79609 Pain in unspecified limb: Secondary | ICD-10-CM | POA: Insufficient documentation

## 2011-12-21 ENCOUNTER — Emergency Department (HOSPITAL_COMMUNITY)
Admission: EM | Admit: 2011-12-21 | Discharge: 2011-12-21 | Disposition: A | Discharge status: Home / Self Care | Payer: Medicaid Other | Attending: Emergency Medicine | Admitting: Emergency Medicine

## 2011-12-21 ENCOUNTER — Encounter (HOSPITAL_COMMUNITY): Payer: Self-pay | Admitting: *Deleted

## 2011-12-21 ENCOUNTER — Emergency Department (HOSPITAL_COMMUNITY): Payer: Medicaid Other

## 2011-12-21 DIAGNOSIS — F17200 Nicotine dependence, unspecified, uncomplicated: Secondary | ICD-10-CM | POA: Insufficient documentation

## 2011-12-21 DIAGNOSIS — R569 Unspecified convulsions: Secondary | ICD-10-CM | POA: Insufficient documentation

## 2011-12-21 DIAGNOSIS — S199XXA Unspecified injury of neck, initial encounter: Secondary | ICD-10-CM | POA: Insufficient documentation

## 2011-12-21 DIAGNOSIS — M542 Cervicalgia: Secondary | ICD-10-CM | POA: Insufficient documentation

## 2011-12-21 DIAGNOSIS — I10 Essential (primary) hypertension: Secondary | ICD-10-CM | POA: Insufficient documentation

## 2011-12-21 DIAGNOSIS — IMO0001 Reserved for inherently not codable concepts without codable children: Secondary | ICD-10-CM | POA: Insufficient documentation

## 2011-12-21 DIAGNOSIS — W1809XA Striking against other object with subsequent fall, initial encounter: Secondary | ICD-10-CM | POA: Insufficient documentation

## 2011-12-21 DIAGNOSIS — S0993XA Unspecified injury of face, initial encounter: Secondary | ICD-10-CM | POA: Insufficient documentation

## 2011-12-21 LAB — CBC WITH DIFFERENTIAL/PLATELET
Hemoglobin: 16.4 g/dL (ref 13.0–17.0)
Lymphocytes Relative: 19 % (ref 12–46)
Lymphs Abs: 2 10*3/uL (ref 0.7–4.0)
MCH: 32.6 pg (ref 26.0–34.0)
Monocytes Relative: 6 % (ref 3–12)
Neutro Abs: 7.9 10*3/uL — ABNORMAL HIGH (ref 1.7–7.7)
Neutrophils Relative %: 75 % (ref 43–77)
Platelets: 339 10*3/uL (ref 150–400)
RBC: 5.03 MIL/uL (ref 4.22–5.81)
WBC: 10.6 10*3/uL — ABNORMAL HIGH (ref 4.0–10.5)

## 2011-12-21 LAB — BASIC METABOLIC PANEL
BUN: 5 mg/dL — ABNORMAL LOW (ref 6–23)
CO2: 22 mEq/L (ref 19–32)
Chloride: 102 mEq/L (ref 96–112)
GFR calc non Af Amer: >90 mL/min (ref >90)
Glucose, Bld: 93 mg/dL (ref 70–99)
Potassium: 3.7 mEq/L (ref 3.5–5.1)
Sodium: 137 mEq/L (ref 135–145)

## 2011-12-21 MED ORDER — SODIUM CHLORIDE 0.9 % IV SOLN
Freq: Once | INTRAVENOUS | Status: AC
  Administered 2011-12-21: 15:00:00 via INTRAVENOUS

## 2011-12-21 MED ORDER — ALPRAZOLAM 1 MG PO TABS: ORAL_TABLET | ORAL | Status: DC

## 2011-12-21 MED ORDER — LORAZEPAM 2 MG/ML IJ SOLN
0.5000 mg | Freq: Once | INTRAMUSCULAR | Status: AC
  Administered 2011-12-21: 0.5 mg via INTRAVENOUS
  Filled 2011-12-21: qty 1

## 2011-12-21 NOTE — ED Provider Notes (Signed)
History   This chart was scribed for Theodore Lennert, MD by Gerlean Ren. This patient was seen in room APA15/APA15 and the patient's care was started at 2:31PM.   CSN: [id number]  Arrival date & time 12/21/11  1302   First MD Initiated Contact with Patient 12/21/11 1425      Chief Complaint  Patient presents with  . Seizures    (Consider location/radiation/quality/duration/timing/severity/associated sxs/prior treatment) Patient is a 36 y.o. male presenting with seizures. The history is provided by the patient. No language interpreter was used.  Seizures  This is a recurrent problem. Pertinent negatives include no headaches, no chest pain, no cough and no diarrhea.   Theodore Crawford is a 36 y.o. male with a h/o anxiety who presents to the Emergency Department complaining of 3 seizures today, with the third seizures causing the pt to fall forwards and hit chin on the arm of a chair snapping his neck back.  Pt reports chin and neck pain, with further generalized myalgia as a result of the muscle tension during seizures.  Pt normally takes 1mg  xanax 4 times per day, but has not taken it over the past week. Pt ha h/o HTN, depression, bipolar 1 disorder, and PTSD.  Pt is a current everyday smoker (1pack/day) and reports alcohol use.  Past Medical History  Diagnosis Date  . Leg pain, right   . Hypertension   . Depression   . Bipolar 1 disorder   . PTSD (post-traumatic stress disorder)   . Anxiety disorder   . Back pain     Past Surgical History  Procedure Date  . Chest tube insertion     History reviewed. No pertinent family history.  History  Substance Use Topics  . Smoking status: Current Every Day Smoker -- 1.0 packs/day  . Smokeless tobacco: Not on file  . Alcohol Use: Yes      Review of Systems  Constitutional: Negative for fatigue.  HENT: Negative for congestion, sinus pressure and ear discharge.   Eyes: Negative for discharge.  Respiratory: Negative for cough.     Cardiovascular: Negative for chest pain.  Gastrointestinal: Negative for abdominal pain and diarrhea.  Genitourinary: Negative for frequency and hematuria.  Musculoskeletal: Positive for myalgias. Negative for back pain.  Skin: Negative for rash.  Neurological: Positive for seizures. Negative for headaches.  Hematological: Negative.   Psychiatric/Behavioral: Negative for hallucinations.    Allergies  Amoxicillin; Bee venom; and Penicillins  Home Medications   Current Outpatient Rx  Name Route Sig Dispense Refill  . ACETAMINOPHEN 325 MG PO TABS Oral Take 650 mg by mouth every 6 (six) hours as needed. Pain    . ALPRAZOLAM 1 MG PO TABS Oral Take 1 mg by mouth 4 (four) times daily as needed. For seizure    . ASPIRIN-ACETAMINOPHEN 500-325 MG PO PACK Oral Take 1 Package by mouth daily as needed. headache     . BUPRENORPHINE HCL-NALOXONE HCL 8-2 MG SL SUBL Sublingual Place 1 tablet under the tongue 3 (three) times daily.    Marland Kitchen PAROXETINE HCL 20 MG PO TABS Oral Take 60 mg by mouth every morning.        BP 113/82  Pulse 113  Temp 98.5 F (36.9 C) (Oral)  Resp 20  Ht 6' (1.829 m)  Wt 184 lb (83.462 kg)  BMI 24.95 kg/m2  SpO2 99%  Physical Exam  Constitutional: He is oriented to person, place, and time. He appears well-developed.  HENT:  Head: Normocephalic and atraumatic.  Tenderness under chin from fall. Poor teeth.  Eyes: Conjunctivae normal and EOM are normal. No scleral icterus.  Neck: Neck supple. No tracheal deviation present. No thyromegaly present.  Cardiovascular: Normal rate and regular rhythm.  Exam reveals no gallop and no friction rub.   No murmur heard. Pulmonary/Chest: No stridor. He has no wheezes. He has no rales. He exhibits no tenderness.  Abdominal: He exhibits no distension. There is no tenderness. There is no rebound.  Musculoskeletal: Normal range of motion. He exhibits no edema.  Lymphadenopathy:    He has no cervical adenopathy.  Neurological: He  is oriented to person, place, and time. Coordination normal.  Skin: Skin is warm. No rash noted. No erythema.  Psychiatric: He has a normal mood and affect. His behavior is normal.    ED Course  Procedures (including critical care time) DIAGNOSTIC STUDIES: Oxygen Saturation is 99% on room air, normal by my interpretation.    COORDINATION OF CARE: 2:35PM- Discussed treatment plan with pt at bedside and pt agreed to plan.    Labs Reviewed - No data to display No results found. Results for orders placed during the hospital encounter of 12/21/11  CBC WITH DIFFERENTIAL      Component Value Range   WBC 10.6 (*) 4.0 - 10.5 K/uL   RBC 5.03  4.22 - 5.81 MIL/uL   Hemoglobin 16.4  13.0 - 17.0 g/dL   HCT 16.1  09.6 - 04.5 %   MCV 94.2  78.0 - 100.0 fL   MCH 32.6  26.0 - 34.0 pg   MCHC 34.6  30.0 - 36.0 g/dL   RDW 40.9  81.1 - 91.4 %   Platelets 339  150 - 400 K/uL   Neutrophils Relative 75  43 - 77 %   Neutro Abs 7.9 (*) 1.7 - 7.7 K/uL   Lymphocytes Relative 19  12 - 46 %   Lymphs Abs 2.0  0.7 - 4.0 K/uL   Monocytes Relative 6  3 - 12 %   Monocytes Absolute 0.6  0.1 - 1.0 K/uL   Eosinophils Relative 0  0 - 5 %   Eosinophils Absolute 0.0  0.0 - 0.7 K/uL   Basophils Relative 0  0 - 1 %   Basophils Absolute 0.0  0.0 - 0.1 K/uL  BASIC METABOLIC PANEL      Component Value Range   Sodium 137  135 - 145 mEq/L   Potassium 3.7  3.5 - 5.1 mEq/L   Chloride 102  96 - 112 mEq/L   CO2 22  19 - 32 mEq/L   Glucose, Bld 93  70 - 99 mg/dL   BUN 5 (*) 6 - 23 mg/dL   Creatinine, Ser 7.82  0.50 - 1.35 mg/dL   Calcium 9.5  8.4 - 95.6 mg/dL   GFR calc non Af Amer >90  >90 mL/min   GFR calc Af Amer >90  >90 mL/min  ETHANOL      Component Value Range   Alcohol, Ethyl (B) <11  0 - 11 mg/dL  Ct Head Wo Contrast  05/20/863  *RADIOLOGY REPORT*  Clinical Data: Seizure.  CT HEAD WITHOUT CONTRAST  Technique:  Contiguous axial images were obtained from the base of the skull through the vertex without  contrast.  Comparison: 10/02/2011.  Findings: The ventricles are normal.  No extra-axial fluid collections are seen.  The brainstem and cerebellum are unremarkable.  No acute intracranial findings such as infarction or hemorrhage.  No mass lesions.  The  bony calvarium is intact.  The visualized paranasal sinuses and mastoid air cells are clear.  IMPRESSION: No acute intracranial findings or mass lesions.  No change since recent prior examination.   Original Report Authenticated By: P. Loralie Champagne, M.D.       No diagnosis found.    MDM   The chart was scribed for me under my direct supervision.  I personally performed the history, physical, and medical decision making and all procedures in the evaluation of this patient.Theodore Lennert, MD 12/21/11 2025

## 2011-12-21 NOTE — ED Notes (Addendum)
Seizure this am x3,  Out of xanax x1 week  Neck pain, bit his lip,struck chin on arm of chair.  C collar placed on pt

## 2011-12-21 NOTE — ED Notes (Signed)
See edp's assessment. °

## 2011-12-31 ENCOUNTER — Emergency Department (HOSPITAL_COMMUNITY)
Admission: EM | Admit: 2011-12-31 | Discharge: 2011-12-31 | Disposition: A | Discharge status: Home / Self Care | Payer: Medicaid Other | Attending: Emergency Medicine | Admitting: Emergency Medicine

## 2011-12-31 ENCOUNTER — Encounter (HOSPITAL_COMMUNITY): Payer: Self-pay | Admitting: Emergency Medicine

## 2011-12-31 DIAGNOSIS — R569 Unspecified convulsions: Secondary | ICD-10-CM

## 2011-12-31 DIAGNOSIS — F1320 Sedative, hypnotic or anxiolytic dependence, uncomplicated: Secondary | ICD-10-CM | POA: Insufficient documentation

## 2011-12-31 DIAGNOSIS — F17200 Nicotine dependence, unspecified, uncomplicated: Secondary | ICD-10-CM | POA: Insufficient documentation

## 2011-12-31 DIAGNOSIS — F19939 Other psychoactive substance use, unspecified with withdrawal, unspecified: Secondary | ICD-10-CM | POA: Insufficient documentation

## 2011-12-31 DIAGNOSIS — Z880 Allergy status to penicillin: Secondary | ICD-10-CM | POA: Insufficient documentation

## 2011-12-31 DIAGNOSIS — G40802 Other epilepsy, not intractable, without status epilepticus: Secondary | ICD-10-CM | POA: Insufficient documentation

## 2011-12-31 DIAGNOSIS — IMO0002 Reserved for concepts with insufficient information to code with codable children: Secondary | ICD-10-CM | POA: Insufficient documentation

## 2011-12-31 LAB — POCT I-STAT, CHEM 8
Chloride: 107 mEq/L (ref 96–112)
HCT: 48 % (ref 39.0–52.0)
Hemoglobin: 16.3 g/dL (ref 13.0–17.0)
Potassium: 3.7 mEq/L (ref 3.5–5.1)
Sodium: 142 mEq/L (ref 135–145)

## 2011-12-31 MED ORDER — LORAZEPAM 2 MG/ML IJ SOLN
1.0000 mg | Freq: Once | INTRAMUSCULAR | Status: AC
  Administered 2011-12-31: 1 mg via INTRAVENOUS
  Filled 2011-12-31: qty 1

## 2011-12-31 MED ORDER — ALPRAZOLAM 1 MG PO TABS: 1.0000 mg | ORAL_TABLET | Freq: Four times a day (QID) | ORAL | Status: DC
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
## 2012-01-07 ENCOUNTER — Encounter (HOSPITAL_COMMUNITY): Payer: Self-pay | Admitting: *Deleted

## 2012-03-22 ENCOUNTER — Encounter (HOSPITAL_COMMUNITY): Payer: Self-pay | Admitting: *Deleted

## 2012-03-22 DIAGNOSIS — F329 Major depressive disorder, single episode, unspecified: Secondary | ICD-10-CM | POA: Insufficient documentation

## 2012-03-22 DIAGNOSIS — F17200 Nicotine dependence, unspecified, uncomplicated: Secondary | ICD-10-CM | POA: Insufficient documentation

## 2012-03-22 DIAGNOSIS — F411 Generalized anxiety disorder: Secondary | ICD-10-CM | POA: Insufficient documentation

## 2012-03-22 DIAGNOSIS — I10 Essential (primary) hypertension: Secondary | ICD-10-CM | POA: Insufficient documentation

## 2012-03-22 DIAGNOSIS — K047 Periapical abscess without sinus: Secondary | ICD-10-CM | POA: Insufficient documentation

## 2012-03-22 DIAGNOSIS — G40909 Epilepsy, unspecified, not intractable, without status epilepticus: Secondary | ICD-10-CM | POA: Insufficient documentation

## 2012-03-22 DIAGNOSIS — Z79899 Other long term (current) drug therapy: Secondary | ICD-10-CM | POA: Insufficient documentation

## 2012-03-22 DIAGNOSIS — F4310 Post-traumatic stress disorder, unspecified: Secondary | ICD-10-CM | POA: Insufficient documentation

## 2012-03-22 DIAGNOSIS — Z8739 Personal history of other diseases of the musculoskeletal system and connective tissue: Secondary | ICD-10-CM | POA: Insufficient documentation

## 2012-03-22 DIAGNOSIS — F319 Bipolar disorder, unspecified: Secondary | ICD-10-CM | POA: Insufficient documentation

## 2012-03-22 DIAGNOSIS — F3289 Other specified depressive episodes: Secondary | ICD-10-CM | POA: Insufficient documentation

## 2012-03-22 NOTE — ED Notes (Addendum)
Pt reported waking up this evening and when he did, his lip was swollen.  Unknown injury.  Denies SOB, no difficulty swallowing.  Denies pain.

## 2012-03-23 ENCOUNTER — Emergency Department (HOSPITAL_COMMUNITY)
Admission: EM | Admit: 2012-03-23 | Discharge: 2012-03-23 | Disposition: A | Discharge status: Home / Self Care | Payer: Medicaid Other | Attending: Emergency Medicine | Admitting: Emergency Medicine

## 2012-03-23 DIAGNOSIS — K047 Periapical abscess without sinus: Secondary | ICD-10-CM

## 2012-03-23 MED ORDER — LIDOCAINE-EPINEPHRINE (PF) 2 %-1:200000 IJ SOLN
INTRAMUSCULAR | Status: AC
  Filled 2012-03-23: qty 20

## 2012-03-23 MED ORDER — CLINDAMYCIN HCL 300 MG PO CAPS: 150.0000 mg | ORAL_CAPSULE | Freq: Three times a day (TID) | ORAL | Status: DC

## 2012-03-23 NOTE — ED Notes (Signed)
Discharge instructions given and reviewed with patient.  Prescription given for Clindamycin; effects and use explained.  Patient verbalized understanding to complete all antibiotic.  Patient discharged home in good condition in mother's care.

## 2012-03-23 NOTE — ED Notes (Signed)
J Idol, PA at bedside to I&D abscess to inside upper lip.

## 2012-03-23 NOTE — ED Provider Notes (Signed)
History     CSN: [id number]  Arrival date & time 03/22/12  2249   First MD Initiated Contact with Patient 03/23/12 0025      No chief complaint on file.   (Consider location/radiation/quality/duration/timing/severity/associated sxs/prior treatment) HPI Comments: Theodore Crawford presents with a 1 day history of increasing lip swelling which was worsened when he woke this evening from a nap.  He has moderate throbbing pain at the site and is currently on clindamycin in anticipation of oral surgery consult next week for dental extractions.  He has severe dental decay and has had numerous extractions in the past,  Is anticipating having his upper central and lateral incisors extracted next.   He denies tongue or throat swelling and denies throat pain,  Swelling or shortness of breath.  He has taken no medications for his pain prior to arrival.    The history is provided by the patient.    Past Medical History  Diagnosis Date  . Leg pain, right   . Hypertension   . Depression   . Bipolar 1 disorder   . PTSD (post-traumatic stress disorder)   . Anxiety disorder   . Back pain   . Seizures     Past Surgical History  Procedure Date  . Chest tube insertion     History reviewed. No pertinent family history.  History  Substance Use Topics  . Smoking status: Current Every Day Smoker -- 0.5 packs/day    Types: Cigarettes  . Smokeless tobacco: Never Used  . Alcohol Use: 2.4 oz/week    4 Cans of beer per week      Review of Systems  Constitutional: Negative for fever and chills.  HENT: Positive for facial swelling and dental problem. Negative for congestion, sore throat, rhinorrhea, drooling, trouble swallowing, neck pain and neck stiffness.   Eyes: Negative.   Respiratory: Negative for chest tightness and shortness of breath.   Cardiovascular: Negative for chest pain.  Gastrointestinal: Negative for nausea and abdominal pain.  Genitourinary: Negative.   Musculoskeletal:  Negative for joint swelling and arthralgias.  Skin: Negative.  Negative for rash and wound.  Neurological: Negative for dizziness, weakness, light-headedness, numbness and headaches.  Hematological: Negative.   Psychiatric/Behavioral: Negative.     Allergies  Amoxicillin; Amoxicillin; Bee venom; Penicillins; and Penicillins  Home Medications   Current Outpatient Rx  Name  Route  Sig  Dispense  Refill  . CLINDAMYCIN HCL 300 MG PO CAPS   Oral   Take 300 mg by mouth 3 (three) times daily.         . ACETAMINOPHEN 325 MG PO TABS   Oral   Take 650 mg by mouth every 6 (six) hours as needed. Pain         . ALPRAZOLAM 1 MG PO TABS   Oral   Take 1 mg by mouth 4 (four) times daily as needed. For seizure         . ALPRAZOLAM 1 MG PO TABS      One pill 4 times a day as needed   60 tablet   0   . ALPRAZOLAM 1 MG PO TABS   Oral   Take 1 mg by mouth 4 (four) times daily - after meals and at bedtime.         . ALPRAZOLAM 1 MG PO TABS   Oral   Take 1 tablet (1 mg total) by mouth 4 (four) times daily.   12 tablet   0   .  ASPIRIN-ACETAMINOPHEN 500-325 MG PO PACK   Oral   Take 1 Package by mouth daily as needed. headache          . BUPRENORPHINE HCL-NALOXONE HCL 8-2 MG SL FILM   Sublingual   Place 1 Film under the tongue 3 (three) times daily.         Marland Kitchen BUPRENORPHINE HCL-NALOXONE HCL 8-2 MG SL SUBL   Sublingual   Place 1 tablet under the tongue 3 (three) times daily.         Marland Kitchen CLINDAMYCIN HCL 300 MG PO CAPS   Oral   Take 1 capsule (300 mg total) by mouth 3 (three) times daily.   28 capsule   0   . PAROXETINE HCL 20 MG PO TABS   Oral   Take 60 mg by mouth every morning.           Marland Kitchen PAROXETINE HCL 20 MG PO TABS   Oral   Take 60 mg by mouth daily.         Marland Kitchen RISPERIDONE 1 MG PO TABS   Oral   Take 1 mg by mouth 2 (two) times daily.           BP 132/88  Pulse 102  Temp 98.6 F (37 C) (Oral)  Resp 18  Ht 6' (1.829 m)  Wt 186 lb (84.369 kg)  BMI  25.23 kg/m2  SpO2 100%  Physical Exam  Constitutional: He is oriented to person, place, and time. He appears well-developed and well-nourished. No distress.  HENT:  Head: Normocephalic and atraumatic.  Right Ear: Tympanic membrane and external ear normal.  Left Ear: Tympanic membrane and external ear normal.  Mouth/Throat: Oropharynx is clear and moist and mucous membranes are normal. No oral lesions. Dental abscesses present.       Upper central and lateral incisors with severe dental decay,  Deep cavities along the gingival line.    Partially edentulous.  Moderate induration along left upper lip, tenting noted on mucosal surface without drainage.    Eyes: Conjunctivae normal are normal.  Neck: Normal range of motion. Neck supple.  Cardiovascular: Normal rate and normal heart sounds.   Pulmonary/Chest: Effort normal.  Abdominal: He exhibits no distension.  Musculoskeletal: Normal range of motion.  Lymphadenopathy:    He has no cervical adenopathy.  Neurological: He is alert and oriented to person, place, and time.  Skin: Skin is warm and dry. No erythema.  Psychiatric: He has a normal mood and affect.    ED Course  Procedures (including critical care time)   INCISION AND DRAINAGE Performed by: Burgess Amor Consent: Verbal consent obtained. Risks and benefits: risks, benefits and alternatives were discussed Type: abscess  Body area: lip Anesthesia: local infiltration  Incision was made with a scalpel.  Local anesthetic: lidocaine 2% with epinephrine  Anesthetic total: 2 ml  Complexity: complex Blunt dissection to break up loculations  Drainage: purulent  Drainage amount: moderate  Packing material: none  Patient tolerance: Patient tolerated the procedure well with no immediate complications.     Labs Reviewed - No data to display No results found.   1. Dental abscess       MDM  Pts clindamycin was extended (had one remaining pil to take).  Encouraged  motrin (has 800 mg strength at home).  Recheck by his oral surgeon as scheduled,  But return here sooner for any worsened sx including fever,  Increased swelling.        Burgess Amor, PA 03/23/12  0205 

## 2012-03-25 NOTE — ED Provider Notes (Signed)
Medical screening examination/treatment/procedure(s) were performed by non-physician practitioner and as supervising physician I was immediately available for consultation/collaboration.  Nicoletta Dress. Colon Branch, MD 03/25/12 [phone]

## 2014-01-14 ENCOUNTER — Encounter (HOSPITAL_COMMUNITY): Payer: Self-pay | Admitting: Emergency Medicine

## 2014-01-14 ENCOUNTER — Emergency Department (HOSPITAL_COMMUNITY)
Admission: EM | Admit: 2014-01-14 | Discharge: 2014-01-14 | Disposition: A | Discharge status: Home / Self Care | Payer: Medicaid Other | Attending: Emergency Medicine | Admitting: Emergency Medicine

## 2014-01-14 DIAGNOSIS — F319 Bipolar disorder, unspecified: Secondary | ICD-10-CM | POA: Diagnosis not present

## 2014-01-14 DIAGNOSIS — M5440 Lumbago with sciatica, unspecified side: Secondary | ICD-10-CM | POA: Insufficient documentation

## 2014-01-14 DIAGNOSIS — M545 Low back pain: Secondary | ICD-10-CM | POA: Diagnosis present

## 2014-01-14 DIAGNOSIS — Z720 Tobacco use: Secondary | ICD-10-CM | POA: Diagnosis not present

## 2014-01-14 DIAGNOSIS — F419 Anxiety disorder, unspecified: Secondary | ICD-10-CM | POA: Insufficient documentation

## 2014-01-14 DIAGNOSIS — I10 Essential (primary) hypertension: Secondary | ICD-10-CM | POA: Insufficient documentation

## 2014-01-14 DIAGNOSIS — Z880 Allergy status to penicillin: Secondary | ICD-10-CM | POA: Insufficient documentation

## 2014-01-14 MED ORDER — KETOROLAC TROMETHAMINE 60 MG/2ML IM SOLN
60.0000 mg | Freq: Once | INTRAMUSCULAR | Status: AC
  Administered 2014-01-14: 60 mg via INTRAMUSCULAR
  Filled 2014-01-14: qty 2

## 2014-01-14 MED ORDER — OXYCODONE-ACETAMINOPHEN 5-325 MG PO TABS: 2.0000 | ORAL_TABLET | ORAL | Status: DC | PRN

## 2014-01-14 MED ORDER — OXYCODONE-ACETAMINOPHEN 5-325 MG PO TABS
2.0000 | ORAL_TABLET | Freq: Once | ORAL | Status: AC
  Administered 2014-01-14: 2 via ORAL
  Filled 2014-01-14: qty 2
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
## 2014-01-14 NOTE — Discharge Instructions (Signed)
Ibuprofen 600 mg every 6 hours as needed for pain.  Percocet as prescribed as needed for pain not relieved with ibuprofen.  Followup with your primary Dr. if you're not improving in the next week to discuss further imaging or physical therapy.   Lumbosacral Strain Lumbosacral strain is a strain of any of the parts that make up your lumbosacral vertebrae. Your lumbosacral vertebrae are the bones that make up the lower third of your backbone. Your lumbosacral vertebrae are held together by muscles and tough, fibrous tissue (ligaments).  CAUSES  A sudden blow to your back can cause lumbosacral strain. Also, anything that causes an excessive stretch of the muscles in the low back can cause this strain. This is typically seen when people exert themselves strenuously, fall, lift heavy objects, bend, or crouch repeatedly. RISK FACTORS  Physically demanding work.  Participation in pushing or pulling sports or sports that require a sudden twist of the back (tennis, golf, baseball).  Weight lifting.  Excessive lower back curvature.  Forward-tilted pelvis.  Weak back or abdominal muscles or both.  Tight hamstrings. SIGNS AND SYMPTOMS  Lumbosacral strain may cause pain in the area of your injury or pain that moves (radiates) down your leg.  DIAGNOSIS Your health care provider can often diagnose lumbosacral strain through a physical exam. In some cases, you may need tests such as X-ray exams.  TREATMENT  Treatment for your lower back injury depends on many factors that your clinician will have to evaluate. However, most treatment will include the use of anti-inflammatory medicines. HOME CARE INSTRUCTIONS   Avoid hard physical activities (tennis, racquetball, waterskiing) if you are not in proper physical condition for it. This may aggravate or create problems.  If you have a back problem, avoid sports requiring sudden body movements. Swimming and walking are generally safer  activities.  Maintain good posture.  Maintain a healthy weight.  For acute conditions, you may put ice on the injured area.  Put ice in a plastic bag.  Place a towel between your skin and the bag.  Leave the ice on for 20 minutes, 2-3 times a day.  When the low back starts healing, stretching and strengthening exercises may be recommended. SEEK MEDICAL CARE IF:  Your back pain is getting worse.  You experience severe back pain not relieved with medicines. SEEK IMMEDIATE MEDICAL CARE IF:   You have numbness, tingling, weakness, or problems with the use of your arms or legs.  There is a change in bowel or bladder control.  You have increasing pain in any area of the body, including your belly (abdomen).  You notice shortness of breath, dizziness, or feel faint.  You feel sick to your stomach (nauseous), are throwing up (vomiting), or become sweaty.  You notice discoloration of your toes or legs, or your feet get very cold. MAKE SURE YOU:   Understand these instructions.  Will watch your condition.  Will get help right away if you are not doing well or get worse. Document Released: 12/31/2004 Document Revised: 03/28/2013 Document Reviewed: 11/09/2012 [facility] Patient Information 2015 [place], [place]. This information is not intended to replace advice given to you by your health care provider. Make sure you discuss any questions you have with your health care provider.

## 2014-01-14 NOTE — ED Notes (Signed)
Pt picked up a stove and now c/o lower back pain that radiates down both legs.

## 2014-01-14 NOTE — ED Notes (Signed)
Please disregard primary assessment made by me at 2053, entry made into wrong chart

## 2014-01-15 NOTE — ED Provider Notes (Signed)
CSN: [id number]     Arrival date & time 01/14/14  2041 History   First MD Initiated Contact with Patient 01/14/14 2314     Chief Complaint  Patient presents with  . Back Pain     (Consider location/radiation/quality/duration/timing/severity/associated sxs/prior Treatment) HPI Comments: Patient presents with back pain since lifting an outdoor stove yesterday.  Pain radiates to legs without weakness or bowel or bladder complaints.   Patient is a 38 y.o. male presenting with back pain. The history is provided by the patient.  Back Pain Location:  Lumbar spine Quality:  Stabbing Radiates to:  L posterior upper leg and R posterior upper leg Pain severity:  Severe Pain is:  Same all the time Onset quality:  Sudden Duration:  1 day Timing:  Constant Progression:  Worsening Chronicity:  New Context: lifting heavy objects   Relieved by:  Nothing Worsened by:  Ambulation, bending and palpation Ineffective treatments:  NSAIDs Associated symptoms: no abdominal pain, no bladder incontinence, no bowel incontinence and no numbness     Past Medical History  Diagnosis Date  . Leg pain, right   . Hypertension   . Depression   . Bipolar 1 disorder   . PTSD (post-traumatic stress disorder)   . Anxiety disorder   . Back pain    Past Surgical History  Procedure Laterality Date  . Chest tube insertion     History reviewed. No pertinent family history. History  Substance Use Topics  . Smoking status: Current Every Day Smoker -- 0.50 packs/day    Types: Cigarettes  . Smokeless tobacco: Never Used  . Alcohol Use: No     Comment: none since 2009    Review of Systems  Gastrointestinal: Negative for abdominal pain and bowel incontinence.  Genitourinary: Negative for bladder incontinence.  Musculoskeletal: Positive for back pain.  Neurological: Negative for numbness.  All other systems reviewed and are negative.     Allergies  Amoxicillin; Amoxicillin; Bee venom; Penicillins; and  Penicillins  Home Medications   Prior to Admission medications   Medication Sig Start Date End Date Taking? Authorizing Provider  acetaminophen (TYLENOL) 325 MG tablet Take 650 mg by mouth every 6 (six) hours as needed. Pain   Yes Historical Provider, MD  PARoxetine (PAXIL) 40 MG tablet Take 40 mg by mouth daily.   Yes Historical Provider, MD  oxyCODONE-acetaminophen (PERCOCET) 5-325 MG per tablet Take 2 tablets by mouth every 4 (four) hours as needed. 01/14/14   Geoffery Lyonsouglas Generoso Cropper, MD   BP 126/80  Pulse 87  Temp(Src) 97.9 F (36.6 C) (Oral)  Resp 20  Ht 6' (1.829 m)  Wt 190 lb (86.183 kg)  BMI 25.76 kg/m2  SpO2 100% Physical Exam  Nursing note and vitals reviewed. Constitutional: He is oriented to person, place, and time. He appears well-developed and well-nourished. No distress.  HENT:  Head: Normocephalic and atraumatic.  Neck: Normal range of motion. Neck supple.  Musculoskeletal:  There is ttp in the soft tissues of the lumbar region.  There is no bony tenderness of stepoffs.  Neurological: He is alert and oriented to person, place, and time.  Strength is 5/5 in the BLE.  DTR's are 2+ and symmetrical in the BLE.  He is able to walk on his heels and toes without difficulty.  Skin: Skin is warm and dry. He is not diaphoretic.    ED Course  Procedures (including critical care time) Labs Review Labs Reviewed - No data to display  Imaging Review No results found.  EKG Interpretation None      MDM   Final diagnoses:  Low back pain with sciatica, sciatica laterality unspecified, unspecified back pain laterality    Patient with low back pain after lifting a heavy object.  There are no red flags to suggest an emergent situation.  Will treat with nsaids, pain meds, prn followup if not improving.    Geoffery Lyonsouglas Andreyah Natividad, MD 01/15/14 1039

## 2014-01-25 ENCOUNTER — Encounter (HOSPITAL_COMMUNITY): Payer: Self-pay | Admitting: Emergency Medicine

## 2014-01-25 ENCOUNTER — Emergency Department (HOSPITAL_COMMUNITY)
Admission: EM | Admit: 2014-01-25 | Discharge: 2014-01-25 | Disposition: A | Discharge status: Home / Self Care | Payer: Medicaid Other | Attending: Emergency Medicine | Admitting: Emergency Medicine

## 2014-01-25 DIAGNOSIS — Z880 Allergy status to penicillin: Secondary | ICD-10-CM | POA: Diagnosis not present

## 2014-01-25 DIAGNOSIS — Z79899 Other long term (current) drug therapy: Secondary | ICD-10-CM | POA: Insufficient documentation

## 2014-01-25 DIAGNOSIS — M5440 Lumbago with sciatica, unspecified side: Secondary | ICD-10-CM | POA: Diagnosis not present

## 2014-01-25 DIAGNOSIS — F319 Bipolar disorder, unspecified: Secondary | ICD-10-CM | POA: Diagnosis not present

## 2014-01-25 DIAGNOSIS — Z720 Tobacco use: Secondary | ICD-10-CM | POA: Diagnosis not present

## 2014-01-25 DIAGNOSIS — F419 Anxiety disorder, unspecified: Secondary | ICD-10-CM | POA: Insufficient documentation

## 2014-01-25 DIAGNOSIS — M549 Dorsalgia, unspecified: Secondary | ICD-10-CM | POA: Diagnosis present

## 2014-01-25 DIAGNOSIS — F4310 Post-traumatic stress disorder, unspecified: Secondary | ICD-10-CM | POA: Insufficient documentation

## 2014-01-25 DIAGNOSIS — M5442 Lumbago with sciatica, left side: Secondary | ICD-10-CM

## 2014-01-25 DIAGNOSIS — I10 Essential (primary) hypertension: Secondary | ICD-10-CM | POA: Diagnosis not present

## 2014-01-25 DIAGNOSIS — M5441 Lumbago with sciatica, right side: Secondary | ICD-10-CM

## 2014-01-25 MED ORDER — IBUPROFEN 800 MG PO TABS
800.0000 mg | ORAL_TABLET | Freq: Once | ORAL | Status: AC
  Administered 2014-01-25: 800 mg via ORAL
  Filled 2014-01-25: qty 1

## 2014-01-25 MED ORDER — DIAZEPAM 5 MG PO TABS
5.0000 mg | ORAL_TABLET | Freq: Once | ORAL | Status: AC
  Administered 2014-01-25: 5 mg via ORAL
  Filled 2014-01-25: qty 1

## 2014-01-25 MED ORDER — OXYCODONE-ACETAMINOPHEN 5-325 MG PO TABS: 1.0000 | ORAL_TABLET | Freq: Three times a day (TID) | ORAL | Status: DC | PRN

## 2014-01-25 MED ORDER — OXYCODONE-ACETAMINOPHEN 5-325 MG PO TABS
1.0000 | ORAL_TABLET | Freq: Once | ORAL | Status: AC
  Administered 2014-01-25: 1 via ORAL
  Filled 2014-01-25: qty 1

## 2014-01-25 MED ORDER — IBUPROFEN 800 MG PO TABS: 800.0000 mg | ORAL_TABLET | Freq: Three times a day (TID) | ORAL | Status: AC

## 2014-01-25 MED ORDER — DIAZEPAM 5 MG PO TABS: 5.0000 mg | ORAL_TABLET | Freq: Two times a day (BID) | ORAL | Status: DC

## 2014-01-25 NOTE — Discharge Instructions (Signed)
As discussed, for appropriate ongoing management of your back pain, it is very important that you follow up with orthopedist, in addition to your primary care team.  Return here for concerning changes in your condition.

## 2014-01-25 NOTE — ED Notes (Signed)
[facility]

## 2014-01-25 NOTE — ED Notes (Signed)
Pt c/o lower back pain, shoulder pain, neck pain that started two weeks ago after lifting wood stove, pt was seen in er on 01-14-2014 for same, was seen by pcp 01-22-2014, prescribed flexeril and prednisone with no improvement, denies any loose of control of bowels or bladder function,

## 2014-01-25 NOTE — ED Provider Notes (Signed)
CSN: [id number]     Arrival date & time 01/25/14  1933 History   First MD Initiated Contact with Patient 01/25/14 1943     Chief Complaint  Patient presents with  . Back Pain      HPI  Patient presents with ongoing low back pain. The pain began after lifting a wood stove ten days pta. Since onset the pain has been severe, with bilateral radiation inferiorly.  It has been characteristically the same, with no new falling, incontinence and, fever, chills, abdominal pain. Patient has had relief for several days; first with initial analgesics, second with prednisone.  However, the patient continued to have severe pain. Patient saw his primary care team once during this illness.   Past Medical History  Diagnosis Date  . Leg pain, right   . Hypertension   . Depression   . Bipolar 1 disorder   . PTSD (post-traumatic stress disorder)   . Anxiety disorder   . Back pain    Past Surgical History  Procedure Laterality Date  . Chest tube insertion     No family history on file. History  Substance Use Topics  . Smoking status: Current Every Day Smoker -- 0.50 packs/day    Types: Cigarettes  . Smokeless tobacco: Never Used  . Alcohol Use: No     Comment: none since 2009    Review of Systems  Constitutional:       Per HPI, otherwise negative  HENT:       Per HPI, otherwise negative  Respiratory:       Per HPI, otherwise negative  Cardiovascular:       Per HPI, otherwise negative  Gastrointestinal: Negative for vomiting.  Endocrine:       Negative aside from HPI  Genitourinary:       Neg aside from HPI   Musculoskeletal:       Per HPI, otherwise negative  Skin: Negative.   Neurological: Negative for syncope.  Psychiatric/Behavioral:       Acknowledges a history of substance abuse in the past, denies recent      Allergies  Amoxicillin; Amoxicillin; Bee venom; Penicillins; and Penicillins  Home Medications   Prior to Admission medications   Medication Sig Start  Date End Date Taking? Authorizing Provider  acetaminophen (TYLENOL) 325 MG tablet Take 650 mg by mouth every 6 (six) hours as needed. Pain    Historical Provider, MD  PARoxetine (PAXIL) 40 MG tablet Take 40 mg by mouth daily.    Historical Provider, MD   BP 113/76  Pulse 147  Temp(Src) 98.1 F (36.7 C) (Oral)  Resp 18  Ht 6' (1.829 m)  Wt 192 lb (87.091 kg)  BMI 26.03 kg/m2  SpO2 99% Physical Exam  Nursing note and vitals reviewed. Constitutional: He is oriented to person, place, and time. He appears well-developed. No distress.  HENT:  Head: Normocephalic and atraumatic.  Eyes: Conjunctivae and EOM are normal.  Cardiovascular: Normal rate and regular rhythm.   Pulmonary/Chest: Effort normal. No stridor. No respiratory distress.  Abdominal: He exhibits no distension.  Musculoskeletal: He exhibits no edema.  No gross deformity, no tenderness to palpation.  Patient flexes each hip, both knees, against resistance, 5/5 strength, though with pain elicited in the low back. Sensation and ambulation are intact  Neurological: He is alert and oriented to person, place, and time. He displays no atrophy and no tremor. No cranial nerve deficit or sensory deficit. He exhibits normal muscle tone. He displays no seizure  activity.  Skin: Skin is warm and dry. No rash noted. No erythema.  Psychiatric: He has a normal mood and affect. His behavior is normal.    ED Course  Procedures (including critical care time) I reviewed the EMR.  [facility].  Patient had one narcotic prescription in the past 3 months. This patient presents with ongoing low back pain.  No neurologic deficits, no falling, no incontinence, no other red flags for neurologic, vascular compromise. Patient was stable for discharge with outpatient followup.  Patient was provided orthopedics followup 2 consider physical therapy, additional evaluation and management.  Gerhard Munchobert Laryssa Hassing, MD 01/25/14 2000

## 2014-01-29 ENCOUNTER — Telehealth: Payer: Self-pay | Admitting: Orthopedic Surgery

## 2014-01-29 NOTE — Telephone Encounter (Signed)
Please readdress when the patient has his referral

## 2014-01-29 NOTE — Telephone Encounter (Signed)
We could see the patient after he has failed 6 weeks of physical therapy and has an MRI and plain films completed

## 2014-01-29 NOTE — Telephone Encounter (Signed)
Patient called to request appointment for problem of back pain [facility]; no Xrays appear to have been done.  Discussed with patient that his insurance card has primary care practice [facility], [facility], therefore requiring a referral.  He states he has not been seeing this primary care doctor, due to being unhappy with the office.  Relayed that referral is still required per Medicaid.  Please review Emergency room physician notes regarding the back problem and please advise.  Ph# [phone]

## 2014-01-29 NOTE — Telephone Encounter (Signed)
Called back to patient and relayed.  He will see if his primary care will refer here accordingly.

## 2014-03-02 ENCOUNTER — Emergency Department (HOSPITAL_COMMUNITY)
Admission: EM | Admit: 2014-03-02 | Discharge: 2014-03-02 | Discharge status: Against Medical Advice | Payer: Medicaid Other | Attending: Emergency Medicine | Admitting: Emergency Medicine

## 2014-03-02 ENCOUNTER — Encounter (HOSPITAL_COMMUNITY): Payer: Self-pay | Admitting: *Deleted

## 2014-03-02 DIAGNOSIS — I10 Essential (primary) hypertension: Secondary | ICD-10-CM | POA: Insufficient documentation

## 2014-03-02 DIAGNOSIS — M549 Dorsalgia, unspecified: Secondary | ICD-10-CM | POA: Insufficient documentation

## 2014-03-02 DIAGNOSIS — Z720 Tobacco use: Secondary | ICD-10-CM | POA: Insufficient documentation

## 2014-03-02 DIAGNOSIS — M5489 Other dorsalgia: Secondary | ICD-10-CM

## 2014-03-02 NOTE — ED Notes (Signed)
Refusing to see [facility] Bryant, PA. MD Ward notified.

## 2014-03-02 NOTE — ED Notes (Signed)
I spoke with Dr. Elesa MassedWard who stated that unfortunatly she is too busy with higher acuity patients to see patient, that Link SnufferHobson will have to see that patient instead, despite his preference for wanting to see her.  I explained this to the patient and a male visitor who was at the bedside, stated that Ivery QualeHobson Bryant would be happy to see and treat patient. Patient became very upset, stated "i would just leave then, because I don't want to see him", he also used profanity and referred to PaiaBryant using a racist derogatory term. Patient left ED at this time.

## 2014-03-02 NOTE — ED Notes (Addendum)
Pt states he was lifting a wood stove about a month ago and on and off for 1 month he has been having pain in the back part of his leg. Pt is refusing to see Ivery QualeHobson Bryant, PA because they "had words" the last time he was here.

## 2014-03-09 ENCOUNTER — Encounter (HOSPITAL_COMMUNITY): Payer: Self-pay | Admitting: *Deleted

## 2014-03-09 ENCOUNTER — Emergency Department (HOSPITAL_COMMUNITY): Payer: Medicaid Other

## 2014-03-09 ENCOUNTER — Emergency Department (HOSPITAL_COMMUNITY)
Admission: EM | Admit: 2014-03-09 | Discharge: 2014-03-09 | Disposition: A | Discharge status: Home / Self Care | Payer: Medicaid Other | Attending: Emergency Medicine | Admitting: Emergency Medicine

## 2014-03-09 DIAGNOSIS — Z720 Tobacco use: Secondary | ICD-10-CM | POA: Diagnosis not present

## 2014-03-09 DIAGNOSIS — M5431 Sciatica, right side: Secondary | ICD-10-CM

## 2014-03-09 DIAGNOSIS — F319 Bipolar disorder, unspecified: Secondary | ICD-10-CM | POA: Diagnosis not present

## 2014-03-09 DIAGNOSIS — Z880 Allergy status to penicillin: Secondary | ICD-10-CM | POA: Diagnosis not present

## 2014-03-09 DIAGNOSIS — F419 Anxiety disorder, unspecified: Secondary | ICD-10-CM | POA: Insufficient documentation

## 2014-03-09 DIAGNOSIS — I10 Essential (primary) hypertension: Secondary | ICD-10-CM | POA: Insufficient documentation

## 2014-03-09 DIAGNOSIS — Z79899 Other long term (current) drug therapy: Secondary | ICD-10-CM | POA: Insufficient documentation

## 2014-03-09 DIAGNOSIS — Z7952 Long term (current) use of systemic steroids: Secondary | ICD-10-CM | POA: Insufficient documentation

## 2014-03-09 DIAGNOSIS — M545 Low back pain: Secondary | ICD-10-CM | POA: Diagnosis present

## 2014-03-09 DIAGNOSIS — M549 Dorsalgia, unspecified: Secondary | ICD-10-CM

## 2014-03-09 MED ORDER — HYDROCODONE-ACETAMINOPHEN 5-325 MG PO TABS
2.0000 | ORAL_TABLET | Freq: Once | ORAL | Status: AC
Start: 2014-03-09 — End: 2014-03-09
  Administered 2014-03-09: 2 via ORAL

## 2014-03-09 MED ORDER — KETOROLAC TROMETHAMINE 60 MG/2ML IM SOLN
60.0000 mg | Freq: Once | INTRAMUSCULAR | Status: AC
  Administered 2014-03-09: 60 mg via INTRAMUSCULAR

## 2014-03-09 MED ORDER — DIAZEPAM 5 MG PO TABS
5.0000 mg | ORAL_TABLET | Freq: Once | ORAL | Status: AC
  Administered 2014-03-09: 5 mg via ORAL

## 2014-03-09 MED ORDER — PREDNISONE 50 MG PO TABS
60.0000 mg | ORAL_TABLET | Freq: Once | ORAL | Status: AC
  Administered 2014-03-09: 60 mg via ORAL

## 2014-03-09 MED ORDER — PREDNISONE 10 MG PO TABS: ORAL_TABLET | ORAL | Status: DC

## 2014-03-09 MED ORDER — DIAZEPAM 5 MG PO TABS: 5.0000 mg | ORAL_TABLET | Freq: Three times a day (TID) | ORAL | Status: DC | PRN

## 2014-03-09 NOTE — Discharge Instructions (Signed)
Chronic Back Pain  When back pain lasts longer than 3 months, it is called chronic back pain.People with chronic back pain often go through certain periods that are more intense (flare-ups).  CAUSES Chronic back pain can be caused by wear and tear (degeneration) on different structures in your back. These structures include:  The bones of your spine (vertebrae) and the joints surrounding your spinal cord and nerve roots (facets).  The strong, fibrous tissues that connect your vertebrae (ligaments). Degeneration of these structures may result in pressure on your nerves. This can lead to constant pain. HOME CARE INSTRUCTIONS  Avoid bending, heavy lifting, prolonged sitting, and activities which make the problem worse.  Take brief periods of rest throughout the day to reduce your pain. Lying down or standing usually is better than sitting while you are resting.  Take over-the-counter or prescription medicines only as directed by your caregiver. SEEK IMMEDIATE MEDICAL CARE IF:   You have weakness or numbness in one of your legs or feet.  You have trouble controlling your bladder or bowels.  You have nausea, vomiting, abdominal pain, shortness of breath, or fainting. Document Released: 04/30/2004 Document Revised: 06/15/2011 Document Reviewed: 03/07/2011 [facility] Patient Information 2015 [place], [place]. This information is not intended to replace advice given to you by your health care provider. Make sure you discuss any questions you have with your health care provider.   Take your next dose of prednisone tomorrow evening.  Use the the other medicines as directed.  Do not drive within 4 hours of taking valium as this will make you drowsy.  Avoid lifting,  Bending,  Twisting or any other activity that worsens your pain over the next week.  Apply a heating pad  to your lower back for 20 minutes 3-4 times daily.  You should get rechecked if your symptoms are not better over the next 5 days,  Or  you develop increased pain,  Weakness in your leg(s) or loss of bladder or bowel function - these are symptoms of a worsening condition.

## 2014-03-09 NOTE — ED Notes (Signed)
Low back pain for 1 month, after picking up a wood stove.

## 2014-03-09 NOTE — ED Notes (Signed)
Pt complain in low back that goes down right leg to knee. States he was suppose to have x-rays done but he lost the paper work. Also, states he called Dr. Mort SawyersHarrison's office but they do not take medicaid
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
## 2014-03-09 NOTE — ED Notes (Signed)
Nurse in to give Rx and discharge instruction. Pt states " I plainly told her that prednisone did not work last time." When pt was leaving the department he states he was not getting both of the Rx filed " that stupid bitch "

## 2014-03-27 ENCOUNTER — Emergency Department (HOSPITAL_COMMUNITY)
Admission: EM | Admit: 2014-03-27 | Discharge: 2014-03-27 | Disposition: A | Discharge status: Home / Self Care | Payer: Medicaid Other | Attending: Emergency Medicine | Admitting: Emergency Medicine

## 2014-03-27 ENCOUNTER — Encounter (HOSPITAL_COMMUNITY): Payer: Self-pay

## 2014-03-27 DIAGNOSIS — F319 Bipolar disorder, unspecified: Secondary | ICD-10-CM | POA: Diagnosis not present

## 2014-03-27 DIAGNOSIS — M545 Low back pain, unspecified: Secondary | ICD-10-CM

## 2014-03-27 DIAGNOSIS — Z791 Long term (current) use of non-steroidal anti-inflammatories (NSAID): Secondary | ICD-10-CM | POA: Diagnosis not present

## 2014-03-27 DIAGNOSIS — F419 Anxiety disorder, unspecified: Secondary | ICD-10-CM | POA: Diagnosis not present

## 2014-03-27 DIAGNOSIS — M549 Dorsalgia, unspecified: Secondary | ICD-10-CM

## 2014-03-27 DIAGNOSIS — Z720 Tobacco use: Secondary | ICD-10-CM | POA: Insufficient documentation

## 2014-03-27 DIAGNOSIS — G8929 Other chronic pain: Secondary | ICD-10-CM | POA: Insufficient documentation

## 2014-03-27 DIAGNOSIS — I10 Essential (primary) hypertension: Secondary | ICD-10-CM | POA: Diagnosis not present

## 2014-03-27 DIAGNOSIS — Z880 Allergy status to penicillin: Secondary | ICD-10-CM | POA: Diagnosis not present

## 2014-03-27 DIAGNOSIS — Z7952 Long term (current) use of systemic steroids: Secondary | ICD-10-CM | POA: Diagnosis not present

## 2014-03-27 MED ORDER — DEXAMETHASONE SODIUM PHOSPHATE 4 MG/ML IJ SOLN
10.0000 mg | Freq: Once | INTRAMUSCULAR | Status: AC
  Administered 2014-03-27: 10 mg via INTRAVENOUS
  Filled 2014-03-27: qty 3

## 2014-03-27 MED ORDER — DIAZEPAM 5 MG/ML IJ SOLN
5.0000 mg | Freq: Once | INTRAMUSCULAR | Status: AC
  Administered 2014-03-27: 5 mg via INTRAMUSCULAR

## 2014-03-27 MED ORDER — CYCLOBENZAPRINE HCL 10 MG PO TABS: 10.0000 mg | ORAL_TABLET | Freq: Three times a day (TID) | ORAL | Status: DC | PRN

## 2014-03-27 MED ORDER — NAPROXEN 500 MG PO TABS: 500.0000 mg | ORAL_TABLET | Freq: Two times a day (BID) | ORAL | Status: DC

## 2014-03-27 MED ORDER — KETOROLAC TROMETHAMINE 60 MG/2ML IM SOLN
60.0000 mg | Freq: Once | INTRAMUSCULAR | Status: AC
  Administered 2014-03-27: 60 mg via INTRAMUSCULAR
  Filled 2014-03-27: qty 2

## 2014-03-27 MED ORDER — DIAZEPAM 5 MG/ML IJ SOLN
5.0000 mg | Freq: Once | INTRAMUSCULAR | Status: DC
  Filled 2014-03-27: qty 2

## 2014-03-27 NOTE — ED Notes (Signed)
Discharge instructions given and reviewed with patient by Cherylann ParrB Norman, RN.  Patient threw discharge papers and prescriptions for Naprosyn and Flexeril in the trash can in POCT lab on the way out.  Patient ambulatory with steady gait; discharged home in stable condition. Patient instructed not to drive after receiving IM injection of Valium.
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
## 2014-03-27 NOTE — Discharge Instructions (Signed)
Use ice and heat on your back. Take the medications as prescribed. You need to talk to your doctor about managing your chronic back pain and get a referral to a specialist.    Back Pain, Adult Low back pain is very common. About 1 in 5 people have back pain.The cause of low back pain is rarely dangerous. The pain often gets better over time.About half of people with a sudden onset of back pain feel better in just 2 weeks. About 8 in 10 people feel better by 6 weeks.  CAUSES Some common causes of back pain include:  Strain of the muscles or ligaments supporting the spine.  Wear and tear (degeneration) of the spinal discs.  Arthritis.  Direct injury to the back. DIAGNOSIS Most of the time, the direct cause of low back pain is not known.However, back pain can be treated effectively even when the exact cause of the pain is unknown.Answering your caregiver's questions about your overall health and symptoms is one of the most accurate ways to make sure the cause of your pain is not dangerous. If your caregiver needs more information, he or she may order lab work or imaging tests (X-rays or MRIs).However, even if imaging tests show changes in your back, this usually does not require surgery. HOME CARE INSTRUCTIONS For many people, back pain returns.Since low back pain is rarely dangerous, it is often a condition that people can learn to [facility]ageon their own.   Remain active. It is stressful on the back to sit or stand in one place. Do not sit, drive, or stand in one place for more than 30 minutes at a time. Take short walks on level surfaces as soon as pain allows.Try to increase the length of time you walk each day.  Do not stay in bed.Resting more than 1 or 2 days can delay your recovery.  Do not avoid exercise or work.Your body is made to move.It is not dangerous to be active, even though your back may hurt.Your back will likely heal faster if you return to being active before your pain  is gone.  Pay attention to your body when you bend and lift. Many people have less discomfortwhen lifting if they bend their knees, keep the load close to their bodies,and avoid twisting. Often, the most comfortable positions are those that put less stress on your recovering back.  Find a comfortable position to sleep. Use a firm mattress and lie on your side with your knees slightly bent. If you lie on your back, put a pillow under your knees.  Only take over-the-counter or prescription medicines as directed by your caregiver. Over-the-counter medicines to reduce pain and inflammation are often the most helpful.Your caregiver may prescribe muscle relaxant drugs.These medicines help dull your pain so you can more quickly return to your normal activities and healthy exercise.  Put ice on the injured area.  Put ice in a plastic bag.  Place a towel between your skin and the bag.  Leave the ice on for 15-20 minutes, 03-04 times a day for the first 2 to 3 days. After that, ice and heat may be alternated to reduce pain and spasms.  Ask your caregiver about trying back exercises and gentle massage. This may be of some benefit.  Avoid feeling anxious or stressed.Stress increases muscle tension and can worsen back pain.It is important to recognize when you are anxious or stressed and learn ways to manage it.Exercise is a great option. SEEK MEDICAL CARE IF:  You have pain that is not relieved with rest or medicine.  You have pain that does not improve in 1 week.  You have new symptoms.  You are generally not feeling well. SEEK IMMEDIATE MEDICAL CARE IF:   You have pain that radiates from your back into your legs.  You develop new bowel or bladder control problems.  You have unusual weakness or numbness in your arms or legs.  You develop nausea or vomiting.  You develop abdominal pain.  You feel faint. Document Released: 03/23/2005 Document Revised: 09/22/2011 Document Reviewed:  07/25/2013 [facility] Patient Information 2015 [place], [place]. This information is not intended to replace advice given to you by your health care provider. Make sure you discuss any questions you have with your health care provider.  Heat Therapy Heat therapy can help ease sore, stiff, injured, and tight muscles and joints. Heat relaxes your muscles, which may help ease your pain.  RISKS AND COMPLICATIONS If you have any of the following conditions, do not use heat therapy unless your health care provider has approved:  Poor circulation.  Healing wounds or scarred skin in the area being treated.  Diabetes, heart disease, or high blood pressure.  Not being able to feel (numbness) the area being treated.  Unusual swelling of the area being treated.  Active infections.  Blood clots.  Cancer.  Inability to communicate pain. This may include young children and people who have problems with their brain function (dementia).  Pregnancy. Heat therapy should only be used on old, pre-existing, or long-lasting (chronic) injuries. Do not use heat therapy on new injuries unless directed by your health care provider. HOW TO USE HEAT THERAPY There are several different kinds of heat therapy, including:  Moist heat pack.  Warm water bath.  Hot water bottle.  Electric heating pad.  Heated gel pack.  Heated wrap.  Electric heating pad. Use the heat therapy method suggested by your health care provider. Follow your health care provider's instructions on when and how to use heat therapy. GENERAL HEAT THERAPY RECOMMENDATIONS  Do not sleep while using heat therapy. Only use heat therapy while you are awake.  Your skin may turn pink while using heat therapy. Do not use heat therapy if your skin turns red.  Do not use heat therapy if you have new pain.  High heat or long exposure to heat can cause burns. Be careful when using heat therapy to avoid burning your skin.  Do not use heat  therapy on areas of your skin that are already irritated, such as with a rash or sunburn. SEEK MEDICAL CARE IF:  You have blisters, redness, swelling, or numbness.  You have new pain.  Your pain is worse. MAKE SURE YOU:  Understand these instructions.  Will watch your condition.  Will get help right away if you are not doing well or get worse. Document Released: 06/15/2011 Document Revised: 08/07/2013 Document Reviewed: 05/16/2013 [facility] Patient Information 2015 [place], [place]. This information is not intended to replace advice given to you by your health care provider. Make sure you discuss any questions you have with your health care provider.

## 2014-03-27 NOTE — ED Notes (Signed)
Pain in lower back, states it started initially when he picked up a wood stove a couple of months ago, pain worsened tonight

## 2014-04-09 ENCOUNTER — Emergency Department (HOSPITAL_COMMUNITY)
Admission: EM | Admit: 2014-04-09 | Discharge: 2014-04-09 | Disposition: A | Discharge status: Home / Self Care | Payer: Medicaid Other | Attending: Emergency Medicine | Admitting: Emergency Medicine

## 2014-04-09 ENCOUNTER — Encounter (HOSPITAL_COMMUNITY): Payer: Self-pay | Admitting: Emergency Medicine

## 2014-04-09 DIAGNOSIS — M5432 Sciatica, left side: Secondary | ICD-10-CM | POA: Insufficient documentation

## 2014-04-09 DIAGNOSIS — Z79899 Other long term (current) drug therapy: Secondary | ICD-10-CM | POA: Diagnosis not present

## 2014-04-09 DIAGNOSIS — F4310 Post-traumatic stress disorder, unspecified: Secondary | ICD-10-CM | POA: Diagnosis not present

## 2014-04-09 DIAGNOSIS — F319 Bipolar disorder, unspecified: Secondary | ICD-10-CM | POA: Insufficient documentation

## 2014-04-09 DIAGNOSIS — M545 Low back pain: Secondary | ICD-10-CM | POA: Diagnosis present

## 2014-04-09 DIAGNOSIS — Z720 Tobacco use: Secondary | ICD-10-CM | POA: Diagnosis not present

## 2014-04-09 DIAGNOSIS — M5442 Lumbago with sciatica, left side: Secondary | ICD-10-CM

## 2014-04-09 DIAGNOSIS — I10 Essential (primary) hypertension: Secondary | ICD-10-CM | POA: Insufficient documentation

## 2014-04-09 DIAGNOSIS — Z880 Allergy status to penicillin: Secondary | ICD-10-CM | POA: Diagnosis not present

## 2014-04-09 DIAGNOSIS — F419 Anxiety disorder, unspecified: Secondary | ICD-10-CM | POA: Diagnosis not present

## 2014-04-09 MED ORDER — NAPROXEN 500 MG PO TABS: 500.0000 mg | ORAL_TABLET | Freq: Two times a day (BID) | ORAL | Status: DC

## 2014-04-09 MED ORDER — KETOROLAC TROMETHAMINE 30 MG/ML IJ SOLN
30.0000 mg | Freq: Once | INTRAMUSCULAR | Status: AC
  Administered 2014-04-09: 30 mg via INTRAVENOUS
  Filled 2014-04-09: qty 1

## 2014-04-09 MED ORDER — OXYCODONE-ACETAMINOPHEN 5-325 MG PO TABS: 1.0000 | ORAL_TABLET | ORAL | Status: DC | PRN

## 2014-04-09 MED ORDER — ONDANSETRON HCL 4 MG/2ML IJ SOLN
4.0000 mg | Freq: Once | INTRAMUSCULAR | Status: AC
  Administered 2014-04-09: 4 mg via INTRAVENOUS
  Filled 2014-04-09: qty 2

## 2014-04-09 MED ORDER — DIAZEPAM 5 MG PO TABS: 5.0000 mg | ORAL_TABLET | Freq: Three times a day (TID) | ORAL | Status: DC | PRN

## 2014-04-09 MED ORDER — LORAZEPAM 2 MG/ML IJ SOLN
1.0000 mg | Freq: Once | INTRAMUSCULAR | Status: AC
  Administered 2014-04-09: 1 mg via INTRAVENOUS
  Filled 2014-04-09: qty 1

## 2014-04-09 MED ORDER — HYDROMORPHONE HCL 1 MG/ML IJ SOLN
1.0000 mg | Freq: Once | INTRAMUSCULAR | Status: AC
  Administered 2014-04-09: 1 mg via INTRAVENOUS
  Filled 2014-04-09: qty 1

## 2014-04-09 NOTE — ED Provider Notes (Signed)
CSN: [id number]     Arrival date & time 04/09/14  4098 History   First MD Initiated Contact with Patient 04/09/14 [phone]     Chief Complaint  Patient presents with  . Back Pain     (Consider location/radiation/quality/duration/timing/severity/associated sxs/prior Treatment) Patient is a 39 y.o. male presenting with back pain. The history is provided by the patient.  Back Pain He has history of low back pain and states that it has been severe for the last 2 weeks. Pain radiates down his left leg. Pain is sharp and he rates it a 9/10. He has difficulty finding a position that is comfortable and has been unable to sleep because of pain. He had been seen in the ED 2 weeks ago and given several injections and felt better but woke up the next morning and pain had recurred. He denies any recent trauma. He denies any weakness, numbness, tickling. Denies any bowel or bladder dysfunction. He does relate that he has a new PCP but first appointment is not until sometime in February.  Past Medical History  Diagnosis Date  . Leg pain, right   . Hypertension   . Depression   . Bipolar 1 disorder   . PTSD (post-traumatic stress disorder)   . Anxiety disorder   . Back pain    Past Surgical History  Procedure Laterality Date  . Chest tube insertion     No family history on file. History  Substance Use Topics  . Smoking status: Current Every Day Smoker -- 0.50 packs/day    Types: Cigarettes  . Smokeless tobacco: Never Used  . Alcohol Use: No     Comment: none since 2009    Review of Systems  Musculoskeletal: Positive for back pain.  All other systems reviewed and are negative.     Allergies  Amoxicillin; Amoxicillin; Bee venom; Penicillins; and Penicillins  Home Medications   Prior to Admission medications   Medication Sig Start Date End Date Taking? Authorizing Provider  Aspirin-Acetaminophen-Caffeine (GOODY HEADACHE PO) Take 1 packet by mouth daily as needed (for pain).    Historical  Provider, MD  diazepam (VALIUM) 5 MG tablet Take 1 tablet (5 mg total) by mouth 3 (three) times daily as needed for anxiety. 04/09/14   Dione Booze, MD  naproxen (NAPROSYN) 500 MG tablet Take 1 tablet (500 mg total) by mouth 2 (two) times daily. 04/09/14   Dione Booze, MD  oxyCODONE-acetaminophen (PERCOCET) 5-325 MG per tablet Take 1 tablet by mouth every 4 (four) hours as needed for moderate pain. 04/09/14   Dione Booze, MD  PARoxetine (PAXIL) 40 MG tablet Take 40 mg by mouth daily.    Historical Provider, MD   BP 128/66 mmHg  Pulse 76  Temp(Src) 97.8 F (36.6 C) (Oral)  Resp 18  Ht 6' (1.829 m)  Wt 190 lb (86.183 kg)  BMI 25.76 kg/m2  SpO2 100% Physical Exam  Nursing note and vitals reviewed.  39 year old male, resting comfortably and in no acute distress. Vital signs are normal. Oxygen saturation is 100%, which is normal. Head is normocephalic and atraumatic. PERRLA, EOMI. Oropharynx is clear. Neck is nontender and supple without adenopathy or JVD. Back is moderately tender in the left paralumbar area. Straight leg raise is positive on the right at 30 and on the left and 15. There is no CVA tenderness. Lungs are clear without rales, wheezes, or rhonchi. Chest is nontender. Heart has regular rate and rhythm without murmur. Abdomen is soft, flat, nontender without  masses or hepatosplenomegaly and peristalsis is normoactive. Extremities have no cyanosis or edema, full range of motion is present. Skin is warm and dry without rash. Neurologic: Mental status is normal, cranial nerves are intact, there are no motor or sensory deficits.  ED Course  Procedures (including critical care time)   MDM   Final diagnoses:  Left-sided low back pain with left-sided sciatica    Low back pain with left-sided sciatica. Old records are reviewed and he does have several ED visits for same. His record on the [place] controlled substance reporting website is reviewed and he has not had any  narcotic prescriptions over the last 2 months. He does relate the diazepam has been more effective for him as a muscle relaxer that cyclobenzaprine. He is discharged with prescriptions for diazepam, naproxen, and oxycodone-acetaminophen. He is referred back to his PCP.    Dione Booze, MD 04/09/14 [phone]

## 2014-04-09 NOTE — Discharge Instructions (Signed)
Sciatica °Sciatica is pain, weakness, numbness, or tingling along the path of the sciatic nerve. The nerve starts in the lower back and runs down the back of each leg. The nerve controls the muscles in the lower leg and in the back of the knee, while also providing sensation to the back of the thigh, lower leg, and the sole of your foot. Sciatica is a symptom of another medical condition. For instance, nerve damage or certain conditions, such as a herniated disk or bone spur on the spine, pinch or put pressure on the sciatic nerve. This causes the pain, weakness, or other sensations normally associated with sciatica. Generally, sciatica only affects one side of the body. °CAUSES  °· Herniated or slipped disc. °· Degenerative disk disease. °· A pain disorder involving the narrow muscle in the buttocks (piriformis syndrome). °· Pelvic injury or fracture. °· Pregnancy. °· Tumor (rare). °SYMPTOMS  °Symptoms can vary from mild to very severe. The symptoms usually travel from the low back to the buttocks and down the back of the leg. Symptoms can include: °· Mild tingling or dull aches in the lower back, leg, or hip. °· Numbness in the back of the calf or sole of the foot. °· Burning sensations in the lower back, leg, or hip. °· Sharp pains in the lower back, leg, or hip. °· Leg weakness. °· Severe back pain inhibiting movement. °These symptoms may get worse with coughing, sneezing, laughing, or prolonged sitting or standing. Also, being overweight may worsen symptoms. °DIAGNOSIS  °Your caregiver will perform a physical exam to look for common symptoms of sciatica. He or she may ask you to do certain movements or activities that would trigger sciatic nerve pain. Other tests may be performed to find the cause of the sciatica. These may include: °· Blood tests. °· X-rays. °· Imaging tests, such as an MRI or CT scan. °TREATMENT  °Treatment is directed at the cause of the sciatic pain. Sometimes, treatment is not necessary  and the pain and discomfort goes away on its own. If treatment is needed, your caregiver may suggest: °· Over-the-counter medicines to relieve pain. °· Prescription medicines, such as anti-inflammatory medicine, muscle relaxants, or narcotics. °· Applying heat or ice to the painful area. °· Steroid injections to lessen pain, irritation, and inflammation around the nerve. °· Reducing activity during periods of pain. °· Exercising and stretching to strengthen your abdomen and improve flexibility of your spine. Your caregiver may suggest losing weight if the extra weight makes the back pain worse. °· Physical therapy. °· Surgery to eliminate what is pressing or pinching the nerve, such as a bone spur or part of a herniated disk. °HOME CARE INSTRUCTIONS  °· Only take over-the-counter or prescription medicines for pain or discomfort as directed by your caregiver. °· Apply ice to the affected area for 20 minutes, 3-4 times a day for the first 48-72 hours. Then try heat in the same way. °· Exercise, stretch, or perform your usual activities if these do not aggravate your pain. °· Attend physical therapy sessions as directed by your caregiver. °· Keep all follow-up appointments as directed by your caregiver. °· Do not wear high heels or shoes that do not provide proper support. °· Check your mattress to see if it is too soft. A firm mattress may lessen your pain and discomfort. °SEEK IMMEDIATE MEDICAL CARE IF:  °· You lose control of your bowel or bladder (incontinence). °· You have increasing weakness in the lower back, pelvis, buttocks,   or legs.  You have redness or swelling of your back.  You have a burning sensation when you urinate.  You have pain that gets worse when you lie down or awakens you at night.  Your pain is worse than you have experienced in the past.  Your pain is lasting longer than 4 weeks.  You are suddenly losing weight without reason. MAKE SURE YOU:  Understand these  instructions.  Will watch your condition.  Will get help right away if you are not doing well or get worse. Document Released: 03/17/2001 Document Revised: 09/22/2011 Document Reviewed: 08/02/2011 [facility] Patient Information 2015 [place], [place]. This information is not intended to replace advice given to you by your health care provider. Make sure you discuss any questions you have with your health care provider.  Acetaminophen; Oxycodone tablets What is this medicine? ACETAMINOPHEN; OXYCODONE (a set a MEE noe fen; ox i KOE done) is a pain reliever. It is used to treat mild to moderate pain. This medicine may be used for other purposes; ask your health care provider or pharmacist if you have questions. COMMON BRAND NAME(S): Endocet, Magnacet, Narvox, Percocet, Perloxx, Primalev, Primlev, Roxicet, Xolox What should I tell my health care provider before I take this medicine? They need to know if you have any of these conditions: -brain tumor -Crohn's disease, inflammatory bowel disease, or ulcerative colitis -drug abuse or addiction -head injury -heart or circulation problems -if you often drink alcohol -kidney disease or problems going to the bathroom -liver disease -lung disease, asthma, or breathing problems -an unusual or allergic reaction to acetaminophen, oxycodone, other opioid analgesics, other medicines, foods, dyes, or preservatives -pregnant or trying to get pregnant -breast-feeding How should I use this medicine? Take this medicine by mouth with a full glass of water. Follow the directions on the prescription label. Take your medicine at regular intervals. Do not take your medicine more often than directed. Talk to your pediatrician regarding the use of this medicine in children. Special care may be needed. Patients over 48 years old may have a stronger reaction and need a smaller dose. Overdosage: If you think you have taken too much of this medicine contact a poison  control center or emergency room at once. NOTE: This medicine is only for you. Do not share this medicine with others. What if I miss a dose? If you miss a dose, take it as soon as you can. If it is almost time for your next dose, take only that dose. Do not take double or extra doses. What may interact with this medicine? -alcohol -antihistamines -barbiturates like amobarbital, butalbital, butabarbital, methohexital, pentobarbital, phenobarbital, thiopental, and secobarbital -benztropine -drugs for bladder problems like solifenacin, trospium, oxybutynin, tolterodine, hyoscyamine, and methscopolamine -drugs for breathing problems like ipratropium and tiotropium -drugs for certain stomach or intestine problems like propantheline, homatropine methylbromide, glycopyrrolate, atropine, belladonna, and dicyclomine -general anesthetics like etomidate, ketamine, nitrous oxide, propofol, desflurane, enflurane, halothane, isoflurane, and sevoflurane -medicines for depression, anxiety, or psychotic disturbances -medicines for sleep -muscle relaxants -naltrexone -narcotic medicines (opiates) for pain -phenothiazines like perphenazine, thioridazine, chlorpromazine, mesoridazine, fluphenazine, prochlorperazine, promazine, and trifluoperazine -scopolamine -tramadol -trihexyphenidyl This list may not describe all possible interactions. Give your health care provider a list of all the medicines, herbs, non-prescription drugs, or dietary supplements you use. Also tell them if you smoke, drink alcohol, or use illegal drugs. Some items may interact with your medicine. What should I watch for while using this medicine? Tell your doctor or health care professional  if your pain does not go away, if it gets worse, or if you have new or a different type of pain. You may develop tolerance to the medicine. Tolerance means that you will need a higher dose of the medication for pain relief. Tolerance is normal and is  expected if you take this medicine for a long time. Do not suddenly stop taking your medicine because you may develop a severe reaction. Your body becomes used to the medicine. This does NOT mean you are addicted. Addiction is a behavior related to getting and using a drug for a non-medical reason. If you have pain, you have a medical reason to take pain medicine. Your doctor will tell you how much medicine to take. If your doctor wants you to stop the medicine, the dose will be slowly lowered over time to avoid any side effects. You may get drowsy or dizzy. Do not drive, use machinery, or do anything that needs mental alertness until you know how this medicine affects you. Do not stand or sit up quickly, especially if you are an older patient. This reduces the risk of dizzy or fainting spells. Alcohol may interfere with the effect of this medicine. Avoid alcoholic drinks. There are different types of narcotic medicines (opiates) for pain. If you take more than one type at the same time, you may have more side effects. Give your health care provider a list of all medicines you use. Your doctor will tell you how much medicine to take. Do not take more medicine than directed. Call emergency for help if you have problems breathing. The medicine will cause constipation. Try to have a bowel movement at least every 2 to 3 days. If you do not have a bowel movement for 3 days, call your doctor or health care professional. Do not take Tylenol (acetaminophen) or medicines that have acetaminophen with this medicine. Too much acetaminophen can be very dangerous. Many nonprescription medicines contain acetaminophen. Always read the labels carefully to avoid taking more acetaminophen. What side effects may I notice from receiving this medicine? Side effects that you should report to your doctor or health care professional as soon as possible: -allergic reactions like skin rash, itching or hives, swelling of the face,  lips, or tongue -breathing difficulties, wheezing -confusion -light headedness or fainting spells -severe stomach pain -unusually weak or tired -yellowing of the skin or the whites of the eyes Side effects that usually do not require medical attention (report to your doctor or health care professional if they continue or are bothersome): -dizziness -drowsiness -nausea -vomiting This list may not describe all possible side effects. Call your doctor for medical advice about side effects. You may report side effects to FDA at 1-800-FDA-1088. Where should I keep my medicine? Keep out of the reach of children. This medicine can be abused. Keep your medicine in a safe place to protect it from theft. Do not share this medicine with anyone. Selling or giving away this medicine is dangerous and against the law. Store at room temperature between 20 and 25 degrees C (68 and 77 degrees F). Keep container tightly closed. Protect from light. This medicine may cause accidental overdose and death if it is taken by other adults, children, or pets. Flush any unused medicine down the toilet to reduce the chance of harm. Do not use the medicine after the expiration date. NOTE: This sheet is a summary. It may not cover all possible information. If you have questions about this medicine, talk  to your doctor, pharmacist, or health care provider.  2015, [employer]. (2012-11-14 13:17:35)  Diazepam tablets What is this medicine? DIAZEPAM (dye AZ e pam) is a benzodiazepine. It is used to treat anxiety and nervousness. It also can help treat alcohol withdrawal, relax muscles, and treat certain types of seizures. This medicine may be used for other purposes; ask your health care provider or pharmacist if you have questions. COMMON BRAND NAME(S): Valium What should I tell my health care provider before I take this medicine? They need to know if you have any of these conditions -an alcohol or drug abuse  problem -bipolar disorder, depression, psychosis or other mental health condition -glaucoma -kidney or liver disease -lung or breathing disease -myasthenia gravis -Parkinson's disease -seizures or a history of seizures -suicidal thoughts -an unusual or allergic reaction to diazepam, other benzodiazepines, foods, dyes, or preservatives -pregnant or trying to get pregnant -breast-feeding How should I use this medicine? Take this medicine by mouth with a glass of water. Follow the directions on the prescription label. If this medicine upsets your stomach, take it with food or milk. Take your doses at regular intervals. Do not take your medicine more often than directed. If you have been taking this medicine regularly for some time, do not suddenly stop taking it. You must gradually reduce the dose or you may get severe side effects. Ask your doctor or health care professional for advice. Even after you stop taking this medicine it can still affect your body for several days. Talk to your pediatrician regarding the use of this medicine in children. Special care may be needed. Overdosage: If you think you have taken too much of this medicine contact a poison control center or emergency room at once. NOTE: This medicine is only for you. Do not share this medicine with others. What if I miss a dose? If you miss a dose, take it as soon as you can. If it is almost time for your next dose, take only that dose. Do not take double or extra doses. What may interact with this medicine? -cimetidine -grapefruit juice -herbal or dietary supplements like kava kava, melatonin, St. John's Wort, or valerian -medicines for anxiety or sleeping problems, like alprazolam, lorazepam, or triazolam -medicines for depression, mental problems or psychiatric disturbances -medicines for HIV infection or AIDS -prescription pain medicines -rifampin, rifapentine, or rifabutin -some medicines for seizures like  carbamazepine, phenobarbital, phenytoin, or primidone This list may not describe all possible interactions. Give your health care provider a list of all the medicines, herbs, non-prescription drugs, or dietary supplements you use. Also tell them if you smoke, drink alcohol, or use illegal drugs. Some items may interact with your medicine. What should I watch for while using this medicine? Visit your doctor or health care professional for regular checks on your progress. Your body can become dependent on this medicine. Ask your doctor or health care professional if you still need to take it. You may get drowsy or dizzy. Do not drive, use machinery, or do anything that needs mental alertness until you know how this medicine affects you. To reduce the risk of dizzy and fainting spells, do not stand or sit up quickly, especially if you are an older patient. Alcohol may increase dizziness and drowsiness. Avoid alcoholic drinks. Do not treat yourself for coughs, colds or allergies without asking your doctor or health care professional for advice. Some ingredients can increase possible side effects. What side effects may I notice from receiving this  medicine? Side effects that you should report to your doctor or health care professional as soon as possible: -allergic reactions like skin rash, itching or hives, swelling of the face, lips, or tongue -angry, confused, depressed, other mood changes -breathing problems -feeling faint or lightheaded, falls -muscle cramps -problems with balance, talking, walking -restlessness -tremors -trouble passing urine or change in the amount of urine -unusually weak or tired Side effects that usually do not require medical attention (report to your doctor or health care professional if they continue or are bothersome): -difficulty sleeping, nightmares -dizziness, drowsiness, clumsiness, or unsteadiness, a hangover effect -headache -nausea, vomiting This list may not  describe all possible side effects. Call your doctor for medical advice about side effects. You may report side effects to FDA at 1-800-FDA-1088. Where should I keep my medicine? Keep out of the reach of children. This medicine can be abused. Keep your medicine in a safe place to protect it from theft. Do not share this medicine with anyone. Selling or giving away this medicine is dangerous and against the law. Store at room temperature between 15 and 30 degrees C (59 and 86 degrees F). Protect from light. Keep container tightly closed. Throw away any unused medicine after the expiration date. NOTE: This sheet is a summary. It may not cover all possible information. If you have questions about this medicine, talk to your doctor, pharmacist, or health care provider.  2015, [employer] Standard. (2007-07-11 16:57:35)  Naproxen and naproxen sodium oral immediate-release tablets What is this medicine? NAPROXEN (na PROX en) is a non-steroidal anti-inflammatory drug (NSAID). It is used to reduce swelling and to treat pain. This medicine may be used for dental pain, headache, or painful monthly periods. It is also used for painful joint and muscular problems such as arthritis, tendinitis, bursitis, and gout. This medicine may be used for other purposes; ask your health care provider or pharmacist if you have questions. COMMON BRAND NAME(S): Aflaxen, Aleve, Aleve Arthritis, All Day Relief, Anaprox, Anaprox DS, Naprosyn What should I tell my health care provider before I take this medicine? They need to know if you have any of these conditions: -asthma -cigarette smoker -drink more than 3 alcohol containing drinks a day -heart disease or circulation problems such as heart failure or leg edema (fluid retention) -high blood pressure -kidney disease -liver disease -stomach bleeding or ulcers -an unusual or allergic reaction to naproxen, aspirin, other NSAIDs, other medicines, foods, dyes, or  preservatives -pregnant or trying to get pregnant -breast-feeding How should I use this medicine? Take this medicine by mouth with a glass of water. Follow the directions on the prescription label. Take it with food if your stomach gets upset. Try to not lie down for at least 10 minutes after you take it. Take your medicine at regular intervals. Do not take your medicine more often than directed. Long-term, continuous use may increase the risk of heart attack or stroke. A special MedGuide will be given to you by the pharmacist with each prescription and refill. Be sure to read this information carefully each time. Talk to your pediatrician regarding the use of this medicine in children. Special care may be needed. Overdosage: If you think you have taken too much of this medicine contact a poison control center or emergency room at once. NOTE: This medicine is only for you. Do not share this medicine with others. What if I miss a dose? If you miss a dose, take it as soon as you can. If it  is almost time for your next dose, take only that dose. Do not take double or extra doses. What may interact with this medicine? -alcohol -aspirin -cidofovir -diuretics -lithium -methotrexate -other drugs for inflammation like ketorolac or prednisone -pemetrexed -probenecid -warfarin This list may not describe all possible interactions. Give your health care provider a list of all the medicines, herbs, non-prescription drugs, or dietary supplements you use. Also tell them if you smoke, drink alcohol, or use illegal drugs. Some items may interact with your medicine. What should I watch for while using this medicine? Tell your doctor or health care professional if your pain does not get better. Talk to your doctor before taking another medicine for pain. Do not treat yourself. This medicine does not prevent heart attack or stroke. In fact, this medicine may increase the chance of a heart attack or stroke. The  chance may increase with longer use of this medicine and in people who have heart disease. If you take aspirin to prevent heart attack or stroke, talk with your doctor or health care professional. Do not take other medicines that contain aspirin, ibuprofen, or naproxen with this medicine. Side effects such as stomach upset, nausea, or ulcers may be more likely to occur. Many medicines available without a prescription should not be taken with this medicine. This medicine can cause ulcers and bleeding in the stomach and intestines at any time during treatment. Do not smoke cigarettes or drink alcohol. These increase irritation to your stomach and can make it more susceptible to damage from this medicine. Ulcers and bleeding can happen without warning symptoms and can cause death. You may get drowsy or dizzy. Do not drive, use machinery, or do anything that needs mental alertness until you know how this medicine affects you. Do not stand or sit up quickly, especially if you are an older patient. This reduces the risk of dizzy or fainting spells. This medicine can cause you to bleed more easily. Try to avoid damage to your teeth and gums when you brush or floss your teeth. What side effects may I notice from receiving this medicine? Side effects that you should report to your doctor or health care professional as soon as possible: -black or bloody stools, blood in the urine or vomit -blurred vision -chest pain -difficulty breathing or wheezing -nausea or vomiting -severe stomach pain -skin rash, skin redness, blistering or peeling skin, hives, or itching -slurred speech or weakness on one side of the body -swelling of eyelids, throat, lips -unexplained weight gain or swelling -unusually weak or tired -yellowing of eyes or skin Side effects that usually do not require medical attention (report to your doctor or health care professional if they continue or are  bothersome): -constipation -headache -heartburn This list may not describe all possible side effects. Call your doctor for medical advice about side effects. You may report side effects to FDA at 1-800-FDA-1088. Where should I keep my medicine? Keep out of the reach of children. Store at room temperature between 15 and 30 degrees C (59 and 86 degrees F). Keep container tightly closed. Throw away any unused medicine after the expiration date. NOTE: This sheet is a summary. It may not cover all possible information. If you have questions about this medicine, talk to your doctor, pharmacist, or health care provider.  2015, [employer] Standard. (2009-03-25 20:10:16)

## 2014-04-09 NOTE — ED Notes (Signed)
Per EMS, patient c/o back pain; patient has history of chronic back pain.

## 2014-04-17 ENCOUNTER — Encounter (HOSPITAL_COMMUNITY): Payer: Self-pay

## 2014-04-17 ENCOUNTER — Emergency Department (HOSPITAL_COMMUNITY)
Admission: EM | Admit: 2014-04-17 | Discharge: 2014-04-17 | Disposition: A | Discharge status: Home / Self Care | Payer: Medicaid Other | Attending: Emergency Medicine | Admitting: Emergency Medicine

## 2014-04-17 DIAGNOSIS — M545 Low back pain: Secondary | ICD-10-CM | POA: Insufficient documentation

## 2014-04-17 DIAGNOSIS — Z79899 Other long term (current) drug therapy: Secondary | ICD-10-CM | POA: Insufficient documentation

## 2014-04-17 DIAGNOSIS — Z720 Tobacco use: Secondary | ICD-10-CM | POA: Insufficient documentation

## 2014-04-17 DIAGNOSIS — F4310 Post-traumatic stress disorder, unspecified: Secondary | ICD-10-CM | POA: Diagnosis not present

## 2014-04-17 DIAGNOSIS — M549 Dorsalgia, unspecified: Secondary | ICD-10-CM | POA: Diagnosis present

## 2014-04-17 DIAGNOSIS — F419 Anxiety disorder, unspecified: Secondary | ICD-10-CM | POA: Diagnosis not present

## 2014-04-17 DIAGNOSIS — I10 Essential (primary) hypertension: Secondary | ICD-10-CM | POA: Diagnosis not present

## 2014-04-17 DIAGNOSIS — Z880 Allergy status to penicillin: Secondary | ICD-10-CM | POA: Insufficient documentation

## 2014-04-17 DIAGNOSIS — G8929 Other chronic pain: Secondary | ICD-10-CM | POA: Insufficient documentation

## 2014-04-17 DIAGNOSIS — F319 Bipolar disorder, unspecified: Secondary | ICD-10-CM | POA: Diagnosis not present

## 2014-04-17 MED ORDER — METHOCARBAMOL 500 MG PO TABS: 1000.0000 mg | ORAL_TABLET | Freq: Once | ORAL | Status: DC

## 2014-04-17 MED ORDER — IBUPROFEN 800 MG PO TABS: 800.0000 mg | ORAL_TABLET | Freq: Three times a day (TID) | ORAL | Status: DC | PRN

## 2014-04-17 MED ORDER — IBUPROFEN 800 MG PO TABS
800.0000 mg | ORAL_TABLET | Freq: Once | ORAL | Status: DC
Start: 2014-04-17 — End: 2014-04-17

## 2014-04-17 MED ORDER — METHOCARBAMOL 500 MG PO TABS: 500.0000 mg | ORAL_TABLET | Freq: Two times a day (BID) | ORAL | Status: DC

## 2014-04-17 NOTE — ED Notes (Signed)
Pt refused Ibuprofen and Robaxin. Left ED. SO signed for pt discharge.

## 2014-04-17 NOTE — Discharge Instructions (Signed)
Chronic Back Pain ° When back pain lasts longer than 3 months, it is called chronic back pain. People with chronic back pain often go through certain periods that are more intense (flare-ups).  °CAUSES °Chronic back pain can be caused by wear and tear (degeneration) on different structures in your back. These structures include: °· The bones of your spine (vertebrae) and the joints surrounding your spinal cord and nerve roots (facets). °· The strong, fibrous tissues that connect your vertebrae (ligaments). °Degeneration of these structures may result in pressure on your nerves. This can lead to constant pain. °HOME CARE INSTRUCTIONS °· Avoid bending, heavy lifting, prolonged sitting, and activities which make the problem worse. °· Take brief periods of rest throughout the day to reduce your pain. Lying down or standing usually is better than sitting while you are resting. °· Take over-the-counter or prescription medicines only as directed by your caregiver. °SEEK IMMEDIATE MEDICAL CARE IF:  °· You have weakness or numbness in one of your legs or feet. °· You have trouble controlling your bladder or bowels. °· You have nausea, vomiting, abdominal pain, shortness of breath, or fainting. °Document Released: 04/30/2004 Document Revised: 06/15/2011 Document Reviewed: 03/07/2011 °ExitCare® Patient Information ©2015 [employer]. This information is not intended to replace advice given to you by your health care provider. Make sure you discuss any questions you have with your health care provider. ° ° °Chronic Pain °Chronic pain can be defined as pain that is off and on and lasts for 3-6 months or longer. Many things cause chronic pain, which can make it difficult to make a diagnosis. There are many treatment options available for chronic pain. However, finding a treatment that works well for you may require trying various approaches until the right one is found. Many people benefit from a combination of two or more types  of treatment to control their pain. °SYMPTOMS  °Chronic pain can occur anywhere in the body and can range from mild to very severe. Some types of chronic pain include: °· Headache. °· Low back pain. °· Cancer pain. °· Arthritis pain. °· Neurogenic pain. This is pain resulting from damage to nerves. ° People with chronic pain may also have other symptoms such as: °· Depression. °· Anger. °· Insomnia. °· Anxiety. °DIAGNOSIS  °Your health care provider will help diagnose your condition over time. In many cases, the initial focus will be on excluding possible conditions that could be causing the pain. Depending on your symptoms, your health care provider may order tests to diagnose your condition. Some of these tests may include:  °· Blood tests.   °· CT scan.   °· MRI.   °· X-rays.   °· Ultrasounds.   °· Nerve conduction studies.   °You may need to see a specialist.  °TREATMENT  °Finding treatment that works well may take time. You may be referred to a pain specialist. He or she may prescribe medicine or therapies, such as:  °· Mindful meditation or yoga. °· Shots (injections) of numbing or pain-relieving medicines into the spine or area of pain. °· Local electrical stimulation. °· Acupuncture.   °· Massage therapy.   °· Aroma, color, light, or sound therapy.   °· Biofeedback.   °· Working with a physical therapist to keep from getting stiff.   °· Regular, gentle exercise.   °· Cognitive or behavioral therapy.   °· Group support.   °Sometimes, surgery may be recommended.  °HOME CARE INSTRUCTIONS  °· Take all medicines as directed by your health care provider.   °· Lessen stress in your life by relaxing and   doing things such as listening to calming music.   °· Exercise or be active as directed by your health care provider.   °· Eat a healthy diet and include things such as vegetables, fruits, fish, and lean meats in your diet.   °· Keep all follow-up appointments with your health care provider.   °· Attend a support  group with others suffering from chronic pain. °SEEK MEDICAL CARE IF:  °· Your pain gets worse.   °· You develop a new pain that was not there before.   °· You cannot tolerate medicines given to you by your health care provider.   °· You have new symptoms since your last visit with your health care provider.   °SEEK IMMEDIATE MEDICAL CARE IF:  °· You feel weak.   °· You have decreased sensation or numbness.   °· You lose control of bowel or bladder function.   °· Your pain suddenly gets much worse.   °· You develop shaking. °· You develop chills. °· You develop confusion. °· You develop chest pain. °· You develop shortness of breath.   °MAKE SURE YOU: °· Understand these instructions. °· Will watch your condition. °· Will get help right away if you are not doing well or get worse. °Document Released: 12/13/2001 Document Revised: 11/23/2012 Document Reviewed: 09/16/2012 °ExitCare® Patient Information ©2015 [employer]. This information is not intended to replace advice given to you by your health care provider. Make sure you discuss any questions you have with your health care provider. ° ° °

## 2014-04-17 NOTE — ED Provider Notes (Signed)
TIME SEEN: 8:24 PM  CHIEF COMPLAINT: Back Pain  HPI: Pt is a 39 y.o. M with a h/o chronic back pain who presents to the ED with back pain.   Pt was seen on 04/09/14 for similar symptoms and was discharged with narcotics which he reports he has run out of. Patient has had some intermittent numbness in the left lower extremity and pain with lifting the left lower extremity. Pain radiates down his left leg. He states he has had this pain intermittently since October. He has not yet established an appointment with his primary care physician. No new injury. No bowel or bladder incontinence. No fever.  Of note, patient does have a care plan. He has been seen in the emergency department many times in the past for similar symptoms and dental pain. It is advised to avoid narcotics. Patient has been hostile with staff, threatening when he has refused narcotics.   ROS: See HPI Constitutional: no fever  Eyes: no drainage  ENT: no runny nose   Cardiovascular:  no chest pain  Resp: no SOB  GI: no vomiting GU: no dysuria Integumentary: no rash  Allergy: no hives  Musculoskeletal: no leg swelling  Neurological: no slurred speech ROS otherwise negative  PAST MEDICAL HISTORY/PAST SURGICAL HISTORY:  Past Medical History  Diagnosis Date  . Leg pain, right   . Hypertension   . Depression   . Bipolar 1 disorder   . PTSD (post-traumatic stress disorder)   . Anxiety disorder   . Back pain     MEDICATIONS:  Prior to Admission medications   Medication Sig Start Date End Date Taking? Authorizing Provider  Aspirin-Acetaminophen-Caffeine (GOODY HEADACHE PO) Take 1 packet by mouth daily as needed (for pain).   Yes Historical Provider, MD  diazepam (VALIUM) 5 MG tablet Take 1 tablet (5 mg total) by mouth 3 (three) times daily as needed for anxiety. Patient not taking: Reported on 04/17/2014 04/09/14   Dione Boozeavid Glick, MD  naproxen (NAPROSYN) 500 MG tablet Take 1 tablet (500 mg total) by mouth 2 (two) times  daily. Patient not taking: Reported on 04/17/2014 04/09/14   Dione Boozeavid Glick, MD  oxyCODONE-acetaminophen (PERCOCET) 5-325 MG per tablet Take 1 tablet by mouth every 4 (four) hours as needed for moderate pain. Patient not taking: Reported on 04/17/2014 04/09/14   Dione Boozeavid Glick, MD  PARoxetine (PAXIL) 40 MG tablet Take 40 mg by mouth daily.    Historical Provider, MD    ALLERGIES:  Allergies  Allergen Reactions  . Amoxicillin Anaphylaxis    swelling  . Amoxicillin Anaphylaxis  . Bee Venom Anaphylaxis  . Penicillins Anaphylaxis  . Penicillins Anaphylaxis    SOCIAL HISTORY:  History  Substance Use Topics  . Smoking status: Current Every Day Smoker -- 0.50 packs/day    Types: Cigarettes  . Smokeless tobacco: Never Used  . Alcohol Use: No     Comment: none since 2009    FAMILY HISTORY: History reviewed. No pertinent family history.  EXAM: BP 134/95 mmHg  Pulse 97  Temp(Src) 98.1 F (36.7 C) (Oral)  Resp 16  Ht 6' (1.829 m)  Wt 190 lb (86.183 kg)  BMI 25.76 kg/m2  SpO2 100% CONSTITUTIONAL: Alert and oriented and responds appropriately to questions. Well-appearing; well-nourished HEAD: Normocephalic EYES: Conjunctivae clear, PERRL ENT: normal nose; no rhinorrhea; moist mucous membranes; pharynx without lesions noted NECK: Supple, no meningismus, no LAD  CARD: RRR; S1 and S2 appreciated; no murmurs, no clicks, no rubs, no gallops RESP: Normal chest excursion  without splinting or tachypnea; breath sounds clear and equal bilaterally; no wheezes, no rhonchi, no rales,  ABD/GI: Normal bowel sounds; non-distended; soft, non-tender, no rebound, no guarding BACK:  The back appears normal and is non-tender to palpation, there is no CVA tenderness, no midline step-off or deformity, when distracted pt exhibits no back pain.  EXT: Normal ROM in all joints and extremities, non-tender to palpation; no edema; normal capillary refill; no cyanosis    SKIN: Normal color for age and race; warm NEURO:  Moves all extremities equally, sensation to light touch intact diffusely, normal gait PSYCH: The patient's mood and manner are appropriate. Grooming and personal hygiene are appropriate.  MEDICAL DECISION MAKING: Patient here with complaints of back pain. He may have sciatica but has no red flag symptoms and I do not feel he needs emergent imaging. Have discussed with patient that he now has a care plan and I do not feel it is appropriate to treat him with narcotic pain medication for his chronic back pain. We'll discharge with ibuprofen and Robaxin and have urged him to follow up with his primary care physician. Security at bedside during his entire visit.        Layla Maw Kalani Baray, DO 04/17/14 2105

## 2014-04-17 NOTE — ED Notes (Signed)
Patient had lower back injury in October, patient states 3 weeks ago he re-injured his back. Patient was seen 1 week ago for same, no better per patient.

## 2014-05-01 ENCOUNTER — Encounter: Payer: Self-pay | Admitting: Orthopedic Surgery

## 2014-05-01 ENCOUNTER — Ambulatory Visit (INDEPENDENT_AMBULATORY_CARE_PROVIDER_SITE_OTHER): Payer: Medicaid Other | Admitting: Orthopedic Surgery

## 2014-05-01 VITALS — BP 143/106 | Ht 72.0 in | Wt 190.0 lb

## 2014-05-01 DIAGNOSIS — M5126 Other intervertebral disc displacement, lumbar region: Secondary | ICD-10-CM

## 2014-05-01 MED ORDER — PREDNISONE (PAK) 10 MG PO TABS: ORAL_TABLET | Freq: Every day | ORAL | Status: DC

## 2014-05-01 MED ORDER — GABAPENTIN 100 MG PO CAPS: 100.0000 mg | ORAL_CAPSULE | Freq: Three times a day (TID) | ORAL | Status: DC

## 2014-05-01 MED ORDER — HYDROCODONE-ACETAMINOPHEN 5-325 MG PO TABS: 1.0000 | ORAL_TABLET | ORAL | Status: DC | PRN

## 2014-05-01 MED ORDER — CYCLOBENZAPRINE HCL 10 MG PO TABS: 10.0000 mg | ORAL_TABLET | Freq: Three times a day (TID) | ORAL | Status: DC

## 2014-05-01 NOTE — Patient Instructions (Signed)
Herniated Disk A herniated disk occurs when a disk in your spine bulges out too far. This condition is also called a ruptured disk or slipped disk. Your spine (backbone) is made up of bones called vertebrae. Between each pair of vertebrae is an oval disk with a soft, spongy center that acts as a shock absorber when you move. The spongy center is surrounded by a tough outer ring. When you have a herniated disk, the spongy center of the disk bulges out or ruptures through the outer ring. A herniated disk can press on a nerve between your vertebrae and cause pain. A herniated disk can occur anywhere in your back or neck area, but the lower back is the most common spot. CAUSES  In many cases, a herniated disk occurs just from getting older. As you age, the spongy insides of your disks tend to shrink and dry out. A herniated disk can result from gradual wear and tear. Injury or sudden strain can also cause a herniated disk.  RISK FACTORS Aging is the main risk factor for a herniated disk. Other risk factors include:  Being a man between the ages of 30 and 50 years.  Having a job that requires heavy lifting, bending, or twisting.  Having a job that requires long hours of driving.  Not getting enough exercise.  Being overweight.  Smoking. SIGNS AND SYMPTOMS  Signs and symptoms depend on which disk is herniated.  For a herniated disk in the lower back, you may have sharp pain in:  One part of your leg, hip, or buttocks.  The back of your calf.  The top or sole of your foot (sciatica).   For a herniated disk in the neck, you may feel pain:  When you move your neck.  Near or over your shoulder blade.  That moves to your upper arm, forearm, or fingers.   You may also have muscle weakness. It may be hard to:  Lift your leg or arm.  Stand on your toes.  Squeeze tightly with one of your hands.  Other symptoms can include:  Numbness or tingling in the affected areas of your  body.  Loss of bladder or bowel control. This is a rare but serious sign of a severe herniated disk in the lower back. DIAGNOSIS  Your health care provider will do a physical exam. During this exam, you may have to move certain body parts or assume various positions. For example, your health care provider may do the straight-leg test. This is a good way to test for a herniated disk in your lower back. In this test, the health care provider lifts your leg while you lie on your back. This is to see if you feel pain down your leg. Your health care provider will also check for numbness or loss of feeling.  Your health care provider will also check your:  Reflexes.  Muscle strength.  Posture.  Other tests may be done to help in making a diagnosis. These may include:  An X-ray of the spine to rule out other causes of back pain.   Other imaging studies, such as an MRI or CT scan. This is to check whether the herniated disk is pressing on your spinal canal.  Electromyography (EMG). This test checks the nerves that control muscles. It is sometimes used to identify the specific area of nerve involvement.  TREATMENT  In many cases, herniated disk symptoms go away over a period of days or weeks. You will most   likely be free of symptoms in 3-4 months. Treatment may include the following:  The initial treatment for a herniated disk is ashort period of rest.  Bed rest is often limited to 1 or 2 days. Resting for too long delays recovery.  If you have a herniated disk in your lower back, you should avoid sitting as much as possible because sitting increases pressure on the disk.  Medicines. These may include:   Nonsteroidal anti-inflammatory drugs (NSAIDs).  Muscle relaxants for back spasms.  Narcotic pain medicine if your pain is very bad.   Steroid injections. You may need these along the involved nerve root to help control pain. The steroid is injected in the area of the herniated disk.  It helps by reducing swelling around the disk.  Physical therapy. This may include exercises to strengthen the muscles that help support your spine.   You may need surgery if other treatments do not work.  HOME CARE INSTRUCTIONS Follow all your health care provider's instructions. These may include:  Take all medicines as directed by your health care provider.  Rest for 2 days and then start moving.  Do not sit or stand for long periods of time.  Maintain good posture when sitting and standing.  Avoid movements that cause pain, such as bending or lifting.  When you are able to start lifting things again:  Bend with your knees.  Keep your back straight.  Hold heavy objects close to your body.  If you are overweight, ask your health care provider to help you start a weight-loss program.  When you are able to start exercising, ask your health care provider how much and what type of exercise is best for you.  Work with a physical therapist on stretching and strengthening exercises for your back.  Do not wear high-heeled shoes.  Do not sleep on your belly.  Do not smoke.  Keep all follow-up visits as directed by your health care provider. SEEK MEDICAL CARE IF:  You have back or neck pain that is not getting better after 4 weeks.  You have very bad pain in your back or neck.  You develop numbness, tingling, or weakness along with pain. SEEK IMMEDIATE MEDICAL CARE IF:   You have numbness, tingling, or weakness that makes you unable to use your arms or legs.  You lose control of your bladder or bowels.  You have dizziness or fainting.  You have shortness of breath.  MAKE SURE YOU:   Understand these instructions.  Will watch your condition.  Will get help right away if you are not doing well or get worse. Document Released: 03/20/2000 Document Revised: 08/07/2013 Document Reviewed: 02/24/2013 ExitCare Patient Information 2015 [employer]. This information  is not intended to replace advice given to you by your health care provider. Make sure you discuss any questions you have with your health care provider.  

## 2014-05-01 NOTE — Progress Notes (Signed)
   Subjective:    Patient ID: Theodore Crawford, male    DOB: 1975-05-15, 39 y.o.   MRN: [id number]  Chief Complaint  Patient presents with  . Back Pain    low back pain that radiates down left leg, unknown injury    Back Pain   This is a [age] year old male who was picking up a heavy wood stove about 6 months ago felt acute pain in his back but thought he only pulled a muscle. About 4-5 leg days later he had severe pain and had good emergency room and he's been there 3 different occasions and seen his primary care doctor. He did have plain films they were negative however he has severe pain in his lower back with pain running down his left leg to his ankle associated with numbness in the L5 root distribution and weakness with dorsiflexion of his left foot.  He was treated in the emergency room with ibuprofen oxycodone diazepam and Robaxin without relief. He's had a heating pad without relief.  He says he does not have any trouble going to the bathroom although he has pain when he is having a bowel movement area any long periods of sitting or standing causes him to have pain and has trouble getting in and out of the bathtub.   Review of Systems  Musculoskeletal: Positive for back pain.   Review of systems he has depression and anxiety. The back pain numbness tingling and burning pain in his leg are described above and his constitutional symptoms are negative      Objective:   Physical Exam  BP 143/106 mmHg  Ht 6' (1.829 m)  Wt 190 lb (86.183 kg)  BMI 25.76 kg/m2 His overall appearance is an ectomorphic body habitus he is oriented 3 his grooming is good his mood is pleasant his affect is flat his gait is altered by limping favoring his left leg  Lumbar spine exam he has tenderness in the L4-5 and L5-S1 area left buttock and left SI joint his range of motion is diminished in all planes and painful no instability or palpable step-off to suggest subluxation muscle tone increased on the  left skin is normal without hairy patches or skin lesions. Thoracic and cervical spine nontender normal muscle tone normal skin  Upper extremities normal alignment, normal range of motion, no instability, normal muscle tone, normal skin, normal pulses and normal sensation and cervical axillary and supraclavicular lymph nodes are normal  Lower extremities. Hip range of motion normal nontender both hips stable. Muscle tone in the thighs normal. Skin normal.  Distal pulses are intact 2+ without edema extremities are warm to touch  He has normal sensation in the right leg he has decreased sensation in the L5 distribution and he has weakness in dorsiflexion of his foot and extension of his great toe on the left normal on the right  Groin nodes are negative        Assessment & Plan:  Imaging x-rays lumbar spine I interpreted those is normal  Encounter Diagnosis  Name Primary?  . Lumbar herniated disc Yes   I've ordered an MRI of his spine to assess him for herniated disc at L4-5 or L5-S1. He would need either surgical intervention or epidural steroids if this was confirmed  He would put on Norco 5 mg Flexeril 10 mg Sterapred Dosepak 10 mg for 12 days and gabapentin 100 mg 3 times a day

## 2014-05-07 ENCOUNTER — Other Ambulatory Visit: Payer: Self-pay | Admitting: *Deleted

## 2014-05-07 ENCOUNTER — Telehealth: Payer: Self-pay | Admitting: Orthopedic Surgery

## 2014-05-07 DIAGNOSIS — M5126 Other intervertebral disc displacement, lumbar region: Secondary | ICD-10-CM

## 2014-05-07 MED ORDER — HYDROCODONE-ACETAMINOPHEN 5-325 MG PO TABS: 1.0000 | ORAL_TABLET | ORAL | Status: DC | PRN

## 2014-05-07 NOTE — Telephone Encounter (Signed)
Patient picked up Rx

## 2014-05-07 NOTE — Telephone Encounter (Signed)
Patient is requesting pain medication refill HYDROcodone-acetaminophen (NORCO/VICODIN) 5-325 MG per tablet [[id number]][[id number]] please advise?

## 2014-05-09 ENCOUNTER — Other Ambulatory Visit: Payer: Self-pay | Admitting: Orthopedic Surgery

## 2014-05-09 DIAGNOSIS — M5126 Other intervertebral disc displacement, lumbar region: Secondary | ICD-10-CM

## 2014-05-14 ENCOUNTER — Other Ambulatory Visit: Payer: Self-pay | Admitting: *Deleted

## 2014-05-14 ENCOUNTER — Telehealth: Payer: Self-pay | Admitting: Orthopedic Surgery

## 2014-05-14 DIAGNOSIS — M5126 Other intervertebral disc displacement, lumbar region: Secondary | ICD-10-CM

## 2014-05-14 MED ORDER — HYDROCODONE-ACETAMINOPHEN 5-325 MG PO TABS: 1.0000 | ORAL_TABLET | ORAL | Status: DC | PRN

## 2014-05-14 NOTE — Telephone Encounter (Signed)
Patient called to request refill on pain medication: HYDROcodone-acetaminophen (NORCO/VICODIN) 5-325 MG per tablet [[id number]][[id number]]  - patient's cell # [phone]

## 2014-05-14 NOTE — Telephone Encounter (Signed)
Script refilled and patient picked up

## 2014-05-17 ENCOUNTER — Ambulatory Visit (HOSPITAL_COMMUNITY)
Admission: RE | Admit: 2014-05-17 | Discharge: 2014-05-17 | Disposition: A | Discharge status: Home / Self Care | Payer: Medicaid Other | Source: Ambulatory Visit | Attending: Orthopedic Surgery | Admitting: Orthopedic Surgery

## 2014-05-17 DIAGNOSIS — M5126 Other intervertebral disc displacement, lumbar region: Secondary | ICD-10-CM | POA: Diagnosis not present

## 2014-05-17 DIAGNOSIS — M545 Low back pain: Secondary | ICD-10-CM | POA: Insufficient documentation

## 2014-05-21 ENCOUNTER — Telehealth: Payer: Self-pay | Admitting: Orthopedic Surgery

## 2014-05-22 ENCOUNTER — Telehealth: Payer: Self-pay | Admitting: Orthopedic Surgery

## 2014-05-22 ENCOUNTER — Other Ambulatory Visit: Payer: Self-pay | Admitting: *Deleted

## 2014-05-22 DIAGNOSIS — M5126 Other intervertebral disc displacement, lumbar region: Secondary | ICD-10-CM

## 2014-05-22 MED ORDER — HYDROCODONE-ACETAMINOPHEN 5-325 MG PO TABS: 1.0000 | ORAL_TABLET | ORAL | Status: DC | PRN

## 2014-05-22 MED ORDER — CYCLOBENZAPRINE HCL 10 MG PO TABS: 10.0000 mg | ORAL_TABLET | Freq: Three times a day (TID) | ORAL | Status: DC

## 2014-05-22 NOTE — Telephone Encounter (Signed)
Patient called and advised scripts available for pick up (spoke to his Mom per his 2/15 direction).

## 2014-05-22 NOTE — Telephone Encounter (Signed)
Patient came by to pick up Rx  

## 2014-05-22 NOTE — Telephone Encounter (Signed)
Patient is calling requesting MRI Results from 05/17/14, please advise, he can be reached at [phone]

## 2014-05-22 NOTE — Telephone Encounter (Signed)
Mri results will be called  weds and fridays

## 2014-05-23 ENCOUNTER — Telehealth: Payer: Self-pay | Admitting: Orthopedic Surgery

## 2014-05-23 ENCOUNTER — Other Ambulatory Visit: Payer: Self-pay | Admitting: Orthopedic Surgery

## 2014-05-23 MED ORDER — HYDROCODONE-ACETAMINOPHEN 7.5-325 MG PO TABS: 1.0000 | ORAL_TABLET | ORAL | Status: DC | PRN

## 2014-05-23 NOTE — Telephone Encounter (Signed)
IMPRESSION: Right foraminal disc protrusion at L3-4 with impingement of the right L3 nerve root.  Left foraminal disc protrusion L4-5 with impingement of the left L4 nerve root.   RESULTS GIVEN   REFER TO DR Channing MuttersOY

## 2014-05-23 NOTE — Telephone Encounter (Signed)
-----   Message from Lenoard AdenLeigh A Lovelace sent at 05/23/2014 10:44 AM EST ----- Patient called the office returning your call and he stated that he couldn't get back in touch with you , he left a new phone number that you could reach him [phone] thanks, [employer]erson IncLeighAnn

## 2014-05-25 ENCOUNTER — Telehealth: Payer: Self-pay | Admitting: *Deleted

## 2014-05-25 NOTE — Telephone Encounter (Signed)
FAX SENT WITH OFFICE NOTES TO PATIENTS PCP ADVISING OF RECOMMENDATION OF REFERRAL TO NEUROSURGERY PATIENTS INSURANCE REQUIRES REFERRAL FROM PCP

## 2014-05-28 ENCOUNTER — Telehealth: Payer: Self-pay | Admitting: Orthopedic Surgery

## 2014-05-28 ENCOUNTER — Other Ambulatory Visit: Payer: Self-pay | Admitting: *Deleted

## 2014-05-28 MED ORDER — HYDROCODONE-ACETAMINOPHEN 7.5-325 MG PO TABS: 1.0000 | ORAL_TABLET | ORAL | Status: DC | PRN

## 2014-05-28 NOTE — Telephone Encounter (Signed)
Patient is calling requesting a refill on his HYDROcodone-acetaminophen (NORCO) 7.5-325 MG per tablet [[id number]][[id number]] please advise?

## 2014-05-28 NOTE — Telephone Encounter (Signed)
PATIENT PICKED UP RX 

## 2014-06-04 ENCOUNTER — Telehealth: Payer: Self-pay | Admitting: Orthopedic Surgery

## 2014-06-04 ENCOUNTER — Other Ambulatory Visit: Payer: Self-pay | Admitting: *Deleted

## 2014-06-04 MED ORDER — HYDROCODONE-ACETAMINOPHEN 7.5-325 MG PO TABS: 1.0000 | ORAL_TABLET | ORAL | Status: DC | PRN

## 2014-06-04 NOTE — Telephone Encounter (Signed)
Patient needs a refill on HYDROcodone-acetaminophen (NORCO) 7.5-325 MG per tablet [[id number]][[id number]] please advise? Not sure if this patients referral to Neurosurgery has begun yet ?

## 2014-06-04 NOTE — Telephone Encounter (Signed)
Patient picked up Rx

## 2014-06-04 NOTE — Telephone Encounter (Signed)
Prescription available, patient aware  

## 2014-06-11 ENCOUNTER — Telehealth: Payer: Self-pay | Admitting: Orthopedic Surgery

## 2014-06-11 ENCOUNTER — Other Ambulatory Visit: Payer: Self-pay | Admitting: Orthopedic Surgery

## 2014-06-11 ENCOUNTER — Other Ambulatory Visit: Payer: Self-pay | Admitting: *Deleted

## 2014-06-11 MED ORDER — HYDROCODONE-ACETAMINOPHEN 7.5-325 MG PO TABS: 1.0000 | ORAL_TABLET | ORAL | Status: DC | PRN

## 2014-06-11 MED ORDER — HYDROCODONE-ACETAMINOPHEN 5-325 MG PO TABS: 1.0000 | ORAL_TABLET | Freq: Four times a day (QID) | ORAL | Status: DC | PRN

## 2014-06-11 NOTE — Telephone Encounter (Signed)
Patient Picked up Rx 

## 2014-06-11 NOTE — Telephone Encounter (Signed)
Patient called to request refill on medication HYDROcodone-acetaminophen (NORCO) 7.5-325 MG per tablet - states did receive a call from [facility] (regarding referral) and has not yet called them back.     *This message was entered 06/11/14; initially, phone note was closed in error and unable to be opened; therefore, re-entered onto a

## 2014-06-11 NOTE — Telephone Encounter (Signed)
He needs to contact his primary care doctor about referral, they have to refer due to insurance

## 2014-06-11 NOTE — Telephone Encounter (Signed)
PATIENT IS COMING IN REQUESTING  A NEW REFERRAL TO NEURO DUE TO THE ONE HE HAS IS TO FAR OF A DRIVE FOR HIS GRANDMOTHER TO GET HIM THERE, HIS GRANDMOTHER STATES THAT SHE CAN GET TO [facility] WITH IS CLOSER?

## 2014-06-11 NOTE — Telephone Encounter (Signed)
Patient called to request refill on medication HYDROcodone-acetaminophen (NORCO) 7.5-325 MG per tablet - states did receive a call from [facility] (regarding referral) and has not yet called them back.

## 2014-06-11 NOTE — Telephone Encounter (Signed)
Prescription available, patient aware  

## 2014-06-11 NOTE — Telephone Encounter (Signed)
Grand mother is aware to contact PCP

## 2014-06-12 ENCOUNTER — Telehealth: Payer: Self-pay | Admitting: Orthopedic Surgery

## 2014-06-12 NOTE — Telephone Encounter (Signed)
Patient asked why you went down to lower dose on pain medication and lower qty? Just increased dose last week and went back down on dose and qty this week.

## 2014-06-12 NOTE — Telephone Encounter (Signed)
Its a narcotic and the dose was too high for long term use   Any problems see PMD or go to ER if having severe pain

## 2014-06-12 NOTE — Telephone Encounter (Signed)
jaime find out what his questions are then I will call him back

## 2014-06-12 NOTE — Telephone Encounter (Signed)
Patient is calling needing to ask Dr. Romeo AppleHarrison a few questions and would like a return call at [phone] please advise?

## 2014-06-13 NOTE — Telephone Encounter (Signed)
Patient is aware 

## 2014-06-14 ENCOUNTER — Other Ambulatory Visit: Payer: Self-pay | Admitting: *Deleted

## 2014-06-14 DIAGNOSIS — M5126 Other intervertebral disc displacement, lumbar region: Secondary | ICD-10-CM

## 2014-06-14 MED ORDER — GABAPENTIN 100 MG PO CAPS: 100.0000 mg | ORAL_CAPSULE | Freq: Three times a day (TID) | ORAL | Status: DC

## 2014-06-18 ENCOUNTER — Telehealth: Payer: Self-pay | Admitting: Orthopedic Surgery

## 2014-06-18 NOTE — Telephone Encounter (Signed)
Patient called requesting pain medication refill and I explained to him Dr. Romeo AppleHarrison is not longer prescribing pain medication to him and he will need to get medication from PCP or Neurosurgeon that he was referred to which he was supposed to see Tuesday 06/19/14 which patient stated that he had to cancel that appointment until he could find a ride.

## 2014-08-15 ENCOUNTER — Encounter (HOSPITAL_COMMUNITY): Payer: Self-pay | Admitting: Emergency Medicine

## 2014-08-15 ENCOUNTER — Emergency Department (HOSPITAL_COMMUNITY)
Admission: EM | Admit: 2014-08-15 | Discharge: 2014-08-15 | Disposition: A | Discharge status: Home / Self Care | Payer: Medicaid Other | Attending: Emergency Medicine | Admitting: Emergency Medicine

## 2014-08-15 DIAGNOSIS — F4310 Post-traumatic stress disorder, unspecified: Secondary | ICD-10-CM | POA: Diagnosis not present

## 2014-08-15 DIAGNOSIS — Z880 Allergy status to penicillin: Secondary | ICD-10-CM | POA: Diagnosis not present

## 2014-08-15 DIAGNOSIS — F419 Anxiety disorder, unspecified: Secondary | ICD-10-CM | POA: Diagnosis not present

## 2014-08-15 DIAGNOSIS — Y270XXA Contact with steam and hot vapors, undetermined intent, initial encounter: Secondary | ICD-10-CM | POA: Insufficient documentation

## 2014-08-15 DIAGNOSIS — F329 Major depressive disorder, single episode, unspecified: Secondary | ICD-10-CM | POA: Diagnosis not present

## 2014-08-15 DIAGNOSIS — Z720 Tobacco use: Secondary | ICD-10-CM | POA: Diagnosis not present

## 2014-08-15 DIAGNOSIS — Y9289 Other specified places as the place of occurrence of the external cause: Secondary | ICD-10-CM | POA: Diagnosis not present

## 2014-08-15 DIAGNOSIS — Y9389 Activity, other specified: Secondary | ICD-10-CM | POA: Insufficient documentation

## 2014-08-15 DIAGNOSIS — T23201A Burn of second degree of right hand, unspecified site, initial encounter: Secondary | ICD-10-CM | POA: Diagnosis not present

## 2014-08-15 DIAGNOSIS — Y998 Other external cause status: Secondary | ICD-10-CM | POA: Insufficient documentation

## 2014-08-15 DIAGNOSIS — Z79899 Other long term (current) drug therapy: Secondary | ICD-10-CM | POA: Insufficient documentation

## 2014-08-15 DIAGNOSIS — T23001A Burn of unspecified degree of right hand, unspecified site, initial encounter: Secondary | ICD-10-CM | POA: Diagnosis present

## 2014-08-15 DIAGNOSIS — I10 Essential (primary) hypertension: Secondary | ICD-10-CM | POA: Diagnosis not present

## 2014-08-15 MED ORDER — TRAMADOL HCL 50 MG PO TABS: 50.0000 mg | ORAL_TABLET | Freq: Four times a day (QID) | ORAL | Status: DC | PRN

## 2014-08-15 MED ORDER — TETANUS-DIPHTH-ACELL PERTUSSIS 5-2.5-18.5 LF-MCG/0.5 IM SUSP
0.5000 mL | Freq: Once | INTRAMUSCULAR | Status: DC
  Filled 2014-08-15: qty 0.5

## 2014-08-15 MED ORDER — SILVER SULFADIAZINE 1 % EX CREA
TOPICAL_CREAM | Freq: Once | CUTANEOUS | Status: AC
  Administered 2014-08-15: 20:00:00 via TOPICAL
  Filled 2014-08-15: qty 50

## 2014-08-15 MED ORDER — NAPROXEN 500 MG PO TABS: 500.0000 mg | ORAL_TABLET | Freq: Two times a day (BID) | ORAL | Status: DC
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
## 2014-08-15 NOTE — Discharge Instructions (Signed)
Follow up with your regular doctor in 2 days or follow up with the hand surgeon. Return here as needed.  Do not drive while taking the narcotic as it will make you sleepy.   Burn Care Your skin is a natural barrier to infection. It is the largest organ of your body. Burns damage this natural protection. To help prevent infection, it is very important to follow your caregiver's instructions in the care of your burn. Burns are classified as:  First degree. There is only redness of the skin (erythema). No scarring is expected.  Second degree. There is blistering of the skin. Scarring may occur with deeper burns.  Third degree. All layers of the skin are injured, and scarring is expected. HOME CARE INSTRUCTIONS   Wash your hands well before changing your bandage.  Change your bandage as often as directed by your caregiver.  Remove the old bandage. If the bandage sticks, you may soak it off with cool, clean water.  Cleanse the burn thoroughly but gently with mild soap and water.  Pat the area dry with a clean, dry cloth.  Apply a thin layer of antibacterial cream to the burn.  Apply a clean bandage as instructed by your caregiver.  Keep the bandage as clean and dry as possible.  Elevate the affected area for the first 24 hours, then as instructed by your caregiver.  Only take over-the-counter or prescription medicines for pain, discomfort, or fever as directed by your caregiver. SEEK IMMEDIATE MEDICAL CARE IF:   You develop excessive pain.  You develop redness, tenderness, swelling, or red streaks near the burn.  The burned area develops yellowish-white fluid (pus) or a bad smell.  You have a fever. MAKE SURE YOU:   Understand these instructions.  Will watch your condition.  Will get help right away if you are not doing well or get worse. Document Released: 03/23/2005 Document Revised: 06/15/2011 Document Reviewed: 08/13/2010 [facility] Patient Information 2015 [place]. This information is not intended to replace advice given to you by your health care provider. Make sure you discuss any questions you have with your health care provider.

## 2014-08-15 NOTE — ED Notes (Signed)
Patient with no complaints at this time. Respirations even and unlabored. Skin warm/dry. Discharge instructions reviewed with patient at this time. Patient given opportunity to voice concerns/ask questions.atient discharged at this time and left Emergency Department with steady gait.   

## 2014-08-20 ENCOUNTER — Encounter (HOSPITAL_COMMUNITY): Payer: Self-pay | Admitting: Emergency Medicine

## 2014-08-20 ENCOUNTER — Emergency Department (HOSPITAL_COMMUNITY)
Admission: EM | Admit: 2014-08-20 | Discharge: 2014-08-20 | Disposition: A | Discharge status: Home / Self Care | Payer: Medicaid Other | Attending: Emergency Medicine | Admitting: Emergency Medicine

## 2014-08-20 DIAGNOSIS — F4310 Post-traumatic stress disorder, unspecified: Secondary | ICD-10-CM | POA: Insufficient documentation

## 2014-08-20 DIAGNOSIS — Z791 Long term (current) use of non-steroidal anti-inflammatories (NSAID): Secondary | ICD-10-CM | POA: Insufficient documentation

## 2014-08-20 DIAGNOSIS — Z720 Tobacco use: Secondary | ICD-10-CM | POA: Diagnosis not present

## 2014-08-20 DIAGNOSIS — G8929 Other chronic pain: Secondary | ICD-10-CM | POA: Insufficient documentation

## 2014-08-20 DIAGNOSIS — I10 Essential (primary) hypertension: Secondary | ICD-10-CM | POA: Diagnosis not present

## 2014-08-20 DIAGNOSIS — F419 Anxiety disorder, unspecified: Secondary | ICD-10-CM | POA: Diagnosis not present

## 2014-08-20 DIAGNOSIS — F319 Bipolar disorder, unspecified: Secondary | ICD-10-CM | POA: Insufficient documentation

## 2014-08-20 DIAGNOSIS — Z7982 Long term (current) use of aspirin: Secondary | ICD-10-CM | POA: Insufficient documentation

## 2014-08-20 DIAGNOSIS — Z880 Allergy status to penicillin: Secondary | ICD-10-CM | POA: Diagnosis not present

## 2014-08-20 DIAGNOSIS — M545 Low back pain: Secondary | ICD-10-CM | POA: Insufficient documentation

## 2014-08-20 DIAGNOSIS — Z79899 Other long term (current) drug therapy: Secondary | ICD-10-CM | POA: Insufficient documentation

## 2014-08-20 MED ORDER — KETOROLAC TROMETHAMINE 60 MG/2ML IM SOLN
60.0000 mg | Freq: Once | INTRAMUSCULAR | Status: AC
  Administered 2014-08-20: 60 mg via INTRAMUSCULAR
  Filled 2014-08-20: qty 2

## 2014-08-20 MED ORDER — METHOCARBAMOL 500 MG PO TABS: 500.0000 mg | ORAL_TABLET | Freq: Three times a day (TID) | ORAL | Status: DC

## 2014-08-20 MED ORDER — METHOCARBAMOL 500 MG PO TABS
1000.0000 mg | ORAL_TABLET | Freq: Once | ORAL | Status: AC
  Administered 2014-08-20: 1000 mg via ORAL
  Filled 2014-08-20: qty 2

## 2014-08-20 NOTE — ED Notes (Signed)
Pt states that he is having a flare of chronic back pain.  States that he went to pain management but they wanted to do injections and he cursed out the doctor and they fired him.

## 2014-08-20 NOTE — Discharge Instructions (Signed)
Back Pain, Adult °Back pain is very common. The pain often gets better over time. The cause of back pain is usually not dangerous. Most people can learn to manage their back pain on their own.  °HOME CARE  °· Stay active. Start with short walks on flat ground if you can. Try to walk farther each day. °· Do not sit, drive, or stand in one place for more than 30 minutes. Do not stay in bed. °· Do not avoid exercise or work. Activity can help your back heal faster. °· Be careful when you bend or lift an object. Bend at your knees, keep the object close to you, and do not twist. °· Sleep on a firm mattress. Lie on your side, and bend your knees. If you lie on your back, put a pillow under your knees. °· Only take medicines as told by your doctor. °· Put ice on the injured area. °¨ Put ice in a plastic bag. °¨ Place a towel between your skin and the bag. °¨ Leave the ice on for 15-20 minutes, 03-04 times a day for the first 2 to 3 days. After that, you can switch between ice and heat packs. °· Ask your doctor about back exercises or massage. °· Avoid feeling anxious or stressed. Find good ways to deal with stress, such as exercise. °GET HELP RIGHT AWAY IF:  °· Your pain does not go away with rest or medicine. °· Your pain does not go away in 1 week. °· You have new problems. °· You do not feel well. °· The pain spreads into your legs. °· You cannot control when you poop (bowel movement) or pee (urinate). °· Your arms or legs feel weak or lose feeling (numbness). °· You feel sick to your stomach (nauseous) or throw up (vomit). °· You have belly (abdominal) pain. °· You feel like you may pass out (faint). °MAKE SURE YOU:  °· Understand these instructions. °· Will watch your condition. °· Will get help right away if you are not doing well or get worse. °Document Released: 09/09/2007 Document Revised: 06/15/2011 Document Reviewed: 07/25/2013 °ExitCare® Patient Information ©2015 [employer]. This information is not intended  to replace advice given to you by your health care provider. Make sure you discuss any questions you have with your health care provider. ° °Chronic Back Pain ° When back pain lasts longer than 3 months, it is called chronic back pain. People with chronic back pain often go through certain periods that are more intense (flare-ups).  °CAUSES °Chronic back pain can be caused by wear and tear (degeneration) on different structures in your back. These structures include: °· The bones of your spine (vertebrae) and the joints surrounding your spinal cord and nerve roots (facets). °· The strong, fibrous tissues that connect your vertebrae (ligaments). °Degeneration of these structures may result in pressure on your nerves. This can lead to constant pain. °HOME CARE INSTRUCTIONS °· Avoid bending, heavy lifting, prolonged sitting, and activities which make the problem worse. °· Take brief periods of rest throughout the day to reduce your pain. Lying down or standing usually is better than sitting while you are resting. °· Take over-the-counter or prescription medicines only as directed by your caregiver. °SEEK IMMEDIATE MEDICAL CARE IF:  °· You have weakness or numbness in one of your legs or feet. °· You have trouble controlling your bladder or bowels. °· You have nausea, vomiting, abdominal pain, shortness of breath, or fainting. °Document Released: 04/30/2004 Document Revised: 06/15/2011 Document   Reviewed: 03/07/2011 °ExitCare® Patient Information ©2015 [employer]. This information is not intended to replace advice given to you by your health care provider. Make sure you discuss any questions you have with your health care provider. ° °

## 2014-08-22 NOTE — ED Provider Notes (Signed)
CSN: [id number]     Arrival date & time 08/20/14  [id number] History   First MD Initiated Contact with Patient 08/20/14 [phone]     Chief Complaint  Patient presents with  . Back Pain     (Consider location/radiation/quality/duration/timing/severity/associated sxs/prior Treatment) HPI   Theodore Crawford is a 39 y.o. male who presents to the Emergency Department complaining of worsening of his chronic low back pain.  He states that he was seen at a pain management center and advised to try epidural injections as treatment plan, but he states that he refused to have injections in his spine and he was discharged from the practice.  He has been taking tylenol without relief.  He denies any change in his chronic symptoms, urine or bowel changes, abdominal pain, numbness or weakness of the lower extremities and fever.    Past Medical History  Diagnosis Date  . Leg pain, right   . Hypertension   . Depression   . Bipolar 1 disorder   . PTSD (post-traumatic stress disorder)   . Anxiety disorder   . Back pain    Past Surgical History  Procedure Laterality Date  . Chest tube insertion     History reviewed. No pertinent family history. History  Substance Use Topics  . Smoking status: Current Every Day Smoker -- 0.50 packs/day    Types: Cigarettes  . Smokeless tobacco: Never Used  . Alcohol Use: No     Comment: none since 2009    Review of Systems  Constitutional: Negative for fever.  Respiratory: Negative for shortness of breath.   Gastrointestinal: Negative for vomiting, abdominal pain and constipation.  Genitourinary: Negative for dysuria, hematuria, flank pain, decreased urine volume and difficulty urinating.  Musculoskeletal: Positive for back pain. Negative for joint swelling.  Skin: Negative for rash.  Neurological: Negative for weakness and numbness.  All other systems reviewed and are negative.     Allergies  Amoxicillin; Amoxicillin; Bee venom; Penicillins; Penicillins;  Naproxen; and Tramadol  Home Medications   Prior to Admission medications   Medication Sig Start Date End Date Taking? Authorizing Provider  Aspirin-Acetaminophen-Caffeine (GOODY HEADACHE PO) Take 1 packet by mouth daily as needed (for pain).   Yes Historical Provider, MD  cyclobenzaprine (FLEXERIL) 10 MG tablet Take 1 tablet (10 mg total) by mouth 3 (three) times daily. 05/22/14  Yes Vickki HearingStanley E Harrison, MD  gabapentin (NEURONTIN) 100 MG capsule Take 1 capsule (100 mg total) by mouth 3 (three) times daily. 06/14/14  Yes Vickki HearingStanley E Harrison, MD  naproxen (NAPROSYN) 500 MG tablet Take 1 tablet (500 mg total) by mouth 2 (two) times daily. 08/15/14  Yes Hope Orlene OchM Neese, NP  PARoxetine (PAXIL) 40 MG tablet Take 40 mg by mouth daily.   Yes Historical Provider, MD  traMADol (ULTRAM) 50 MG tablet Take 1 tablet (50 mg total) by mouth every 6 (six) hours as needed. 08/15/14  Yes Hope Orlene OchM Neese, NP  diazepam (VALIUM) 5 MG tablet Take 1 tablet (5 mg total) by mouth 3 (three) times daily as needed for anxiety. Patient not taking: Reported on 04/17/2014 04/09/14   Dione Boozeavid Glick, MD  HYDROcodone-acetaminophen (NORCO/VICODIN) 5-325 MG per tablet Take 1 tablet by mouth every 6 (six) hours as needed for moderate pain. Patient not taking: Reported on 08/20/2014 06/11/14   Vickki HearingStanley E Harrison, MD  ibuprofen (ADVIL,MOTRIN) 800 MG tablet Take 1 tablet (800 mg total) by mouth every 8 (eight) hours as needed for mild pain. Patient not taking: Reported on 08/20/2014  04/17/14   Kristen N Ward, DO  methocarbamol (ROBAXIN) 500 MG tablet Take 1 tablet (500 mg total) by mouth 3 (three) times daily. 08/20/14   Dontrelle Mazon, PA-C  oxyCODONE-acetaminophen (PERCOCET) 5-325 MG per tablet Take 1 tablet by mouth every 4 (four) hours as needed for moderate pain. Patient not taking: Reported on 04/17/2014 04/09/14   Dione Boozeavid Glick, MD  predniSONE (STERAPRED UNI-PAK) 10 MG tablet Take by mouth daily. 10 MG 12 DAY DOSE PACK Patient not taking: Reported on  08/20/2014 05/01/14   Vickki HearingStanley E Harrison, MD   BP 150/99 mmHg  Pulse 106  Temp(Src) 98.1 F (36.7 C) (Oral)  Resp 18  Ht 6' (1.829 m)  Wt 190 lb (86.183 kg)  BMI 25.76 kg/m2  SpO2 100% Physical Exam  Constitutional: He is oriented to person, place, and time. He appears well-developed and well-nourished. No distress.  HENT:  Head: Normocephalic and atraumatic.  Neck: Normal range of motion. Neck supple.  Cardiovascular: Normal rate, regular rhythm, normal heart sounds and intact distal pulses.   No murmur heard. Pulmonary/Chest: Effort normal and breath sounds normal. No respiratory distress.  Abdominal: Soft. He exhibits no distension. There is no tenderness.  Musculoskeletal: He exhibits tenderness. He exhibits no edema.       Lumbar back: He exhibits tenderness and pain. He exhibits normal range of motion, no swelling, no deformity, no laceration and normal pulse.  Diffuse ttp of the lumbar paraspinal muscles.  DP pulses are brisk and symmetrical.  Distal sensation intact.  Hip Flexors/Extensors are intact.  Pt has 5/5 strength against resistance of bilateral lower extremities.     Neurological: He is alert and oriented to person, place, and time. He has normal strength. No sensory deficit. He exhibits normal muscle tone. Coordination and gait normal.  Reflex Scores:      Patellar reflexes are 2+ on the right side and 2+ on the left side.      Achilles reflexes are 2+ on the right side and 2+ on the left side. Skin: Skin is warm and dry. No rash noted.  Nursing note and vitals reviewed.   ED Course  Procedures (including critical care time) Labs Review Labs Reviewed - No data to display  Imaging Review No results found.   EKG Interpretation None      MDM   Final diagnoses:  Chronic low back pain    Pt is well appearing, non-toxic.  Ambulates in the dept w/o difficulty.  No focal neuro deficits.  No concerning sx's for emergent neurological process.  Pt understands  that narcotics are not indicated for his chronic back pain and that he will not be given narcotics on this visit.      Rosey Bathammy Eily Louvier, PA-C 08/22/14 2145  Glynn OctaveStephen Rancour, MD 08/23/14 1249

## 2014-10-13 ENCOUNTER — Encounter (HOSPITAL_COMMUNITY): Payer: Self-pay | Admitting: *Deleted

## 2014-10-13 ENCOUNTER — Emergency Department (HOSPITAL_COMMUNITY)
Admission: EM | Admit: 2014-10-13 | Discharge: 2014-10-13 | Discharge status: Against Medical Advice | Payer: Medicaid Other | Attending: Emergency Medicine | Admitting: Emergency Medicine

## 2014-10-13 DIAGNOSIS — F419 Anxiety disorder, unspecified: Secondary | ICD-10-CM | POA: Insufficient documentation

## 2014-10-13 DIAGNOSIS — I10 Essential (primary) hypertension: Secondary | ICD-10-CM | POA: Insufficient documentation

## 2014-10-13 DIAGNOSIS — Y9289 Other specified places as the place of occurrence of the external cause: Secondary | ICD-10-CM | POA: Insufficient documentation

## 2014-10-13 DIAGNOSIS — S8992XA Unspecified injury of left lower leg, initial encounter: Secondary | ICD-10-CM | POA: Insufficient documentation

## 2014-10-13 DIAGNOSIS — Y9389 Activity, other specified: Secondary | ICD-10-CM | POA: Diagnosis not present

## 2014-10-13 DIAGNOSIS — X58XXXA Exposure to other specified factors, initial encounter: Secondary | ICD-10-CM | POA: Insufficient documentation

## 2014-10-13 DIAGNOSIS — F319 Bipolar disorder, unspecified: Secondary | ICD-10-CM | POA: Insufficient documentation

## 2014-10-13 DIAGNOSIS — M25562 Pain in left knee: Secondary | ICD-10-CM

## 2014-10-13 DIAGNOSIS — Z880 Allergy status to penicillin: Secondary | ICD-10-CM | POA: Diagnosis not present

## 2014-10-13 DIAGNOSIS — Z720 Tobacco use: Secondary | ICD-10-CM | POA: Insufficient documentation

## 2014-10-13 DIAGNOSIS — Z791 Long term (current) use of non-steroidal anti-inflammatories (NSAID): Secondary | ICD-10-CM | POA: Insufficient documentation

## 2014-10-13 DIAGNOSIS — Y998 Other external cause status: Secondary | ICD-10-CM | POA: Insufficient documentation

## 2014-10-13 DIAGNOSIS — Z79899 Other long term (current) drug therapy: Secondary | ICD-10-CM | POA: Diagnosis not present

## 2014-10-13 NOTE — ED Provider Notes (Signed)
CSN: [id number]     Arrival date & time 10/13/14  1652 History   First MD Initiated Contact with Patient 07/09/[id number] 1705     Chief Complaint  Patient presents with  . Knee Pain     (Consider location/radiation/quality/duration/timing/severity/associated sxs/prior Treatment) HPI.... Patient states he was moving a piece of furniture and accidentally fell backward wrenching his left knee. He is uncertain which direction his knee rotated. No head or neck trauma. Severity of pain is mild to moderate. No other obvious extremity or truncal injuries.  Past Medical History  Diagnosis Date  . Leg pain, right   . Hypertension   . Depression   . Bipolar 1 disorder   . PTSD (post-traumatic stress disorder)   . Anxiety disorder   . Back pain    Past Surgical History  Procedure Laterality Date  . Chest tube insertion     History reviewed. No pertinent family history. History  Substance Use Topics  . Smoking status: Current Every Day Smoker -- 0.50 packs/day    Types: Cigarettes  . Smokeless tobacco: Never Used  . Alcohol Use: No     Comment: none since 2009    Review of Systems  All other systems reviewed and are negative.     Allergies  Amoxicillin; Amoxicillin; Bee venom; Penicillins; Penicillins; Naproxen; and Tramadol  Home Medications   Prior to Admission medications   Medication Sig Start Date End Date Taking? Authorizing Provider  Aspirin-Acetaminophen-Caffeine (GOODY HEADACHE PO) Take 1 packet by mouth daily as needed (for pain).    Historical Provider, MD  cyclobenzaprine (FLEXERIL) 10 MG tablet Take 1 tablet (10 mg total) by mouth 3 (three) times daily. 05/22/14   Vickki HearingStanley E Harrison, MD  diazepam (VALIUM) 5 MG tablet Take 1 tablet (5 mg total) by mouth 3 (three) times daily as needed for anxiety. Patient not taking: Reported on 04/17/2014 04/09/14   Dione Boozeavid Glick, MD  gabapentin (NEURONTIN) 100 MG capsule Take 1 capsule (100 mg total) by mouth 3 (three) times daily. 06/14/14    Vickki HearingStanley E Harrison, MD  HYDROcodone-acetaminophen (NORCO/VICODIN) 5-325 MG per tablet Take 1 tablet by mouth every 6 (six) hours as needed for moderate pain. Patient not taking: Reported on 08/20/2014 06/11/14   Vickki HearingStanley E Harrison, MD  ibuprofen (ADVIL,MOTRIN) 800 MG tablet Take 1 tablet (800 mg total) by mouth every 8 (eight) hours as needed for mild pain. Patient not taking: Reported on 08/20/2014 04/17/14   Layla MawKristen N Ward, DO  methocarbamol (ROBAXIN) 500 MG tablet Take 1 tablet (500 mg total) by mouth 3 (three) times daily. 08/20/14   Tammy Triplett, PA-C  naproxen (NAPROSYN) 500 MG tablet Take 1 tablet (500 mg total) by mouth 2 (two) times daily. 08/15/14   Hope Orlene OchM Neese, NP  oxyCODONE-acetaminophen (PERCOCET) 5-325 MG per tablet Take 1 tablet by mouth every 4 (four) hours as needed for moderate pain. Patient not taking: Reported on 04/17/2014 04/09/14   Dione Boozeavid Glick, MD  PARoxetine (PAXIL) 40 MG tablet Take 40 mg by mouth daily.    Historical Provider, MD  predniSONE (STERAPRED UNI-PAK) 10 MG tablet Take by mouth daily. 10 MG 12 DAY DOSE PACK Patient not taking: Reported on 08/20/2014 05/01/14   Vickki HearingStanley E Harrison, MD  traMADol (ULTRAM) 50 MG tablet Take 1 tablet (50 mg total) by mouth every 6 (six) hours as needed. 08/15/14   Hope Orlene OchM Neese, NP   BP 120/82 mmHg  Pulse 103  Temp(Src) 98.2 F (36.8 C) (Oral)  Resp 18  Ht 6' (1.829 m)  Wt 186 lb (84.369 kg)  BMI 25.22 kg/m2 Physical Exam  Constitutional: He is oriented to person, place, and time. He appears well-developed and well-nourished.  HENT:  Head: Normocephalic and atraumatic.  Eyes: Conjunctivae and EOM are normal. Pupils are equal, round, and reactive to light.  Neck: Normal range of motion. Neck supple.  Cardiovascular: Normal rate and regular rhythm.   Pulmonary/Chest: Effort normal and breath sounds normal.  Abdominal: Soft. Bowel sounds are normal.  Musculoskeletal: Normal range of motion.  Left lower extremity: No swelling around the  knee. Slight pain with range of motion.  Neurological: He is alert and oriented to person, place, and time.  Skin: Skin is warm and dry.  Psychiatric: He has a normal mood and affect. His behavior is normal.  Nursing note and vitals reviewed.   ED Course  Procedures (including critical care time) Labs Review Labs Reviewed - No data to display  Imaging Review No results found.   EKG Interpretation None      MDM   Final diagnoses:  Left knee pain    I attempted to be very pleasant and professional with the patient and his significant other. I discussed with him the care plan assigned to his visits. I stated no narcotics will be prescribed. I offered an x-ray of his knee. He stormed out of the emergency department muttering about his care.  He was walking without a limp.    Donnetta Hutching, MD 10/13/14 [phone]

## 2014-10-13 NOTE — ED Notes (Signed)
Moving couch down steps and twisted L knee.  C/O pain in L knee radiating up and down leg.

## 2014-10-13 NOTE — ED Notes (Signed)
Dr. Adriana Simasook assessed patient. Pt unhappy with plan of care, pt left AMA, refusing to sign out.

## 2014-10-17 ENCOUNTER — Encounter (HOSPITAL_COMMUNITY): Payer: Self-pay | Admitting: Emergency Medicine

## 2014-10-17 ENCOUNTER — Emergency Department (HOSPITAL_COMMUNITY)
Admission: EM | Admit: 2014-10-17 | Discharge: 2014-10-17 | Disposition: A | Discharge status: Home / Self Care | Payer: Medicaid Other | Attending: Emergency Medicine | Admitting: Emergency Medicine

## 2014-10-17 DIAGNOSIS — Y9289 Other specified places as the place of occurrence of the external cause: Secondary | ICD-10-CM | POA: Diagnosis not present

## 2014-10-17 DIAGNOSIS — F419 Anxiety disorder, unspecified: Secondary | ICD-10-CM | POA: Diagnosis not present

## 2014-10-17 DIAGNOSIS — Z880 Allergy status to penicillin: Secondary | ICD-10-CM | POA: Insufficient documentation

## 2014-10-17 DIAGNOSIS — T63441A Toxic effect of venom of bees, accidental (unintentional), initial encounter: Secondary | ICD-10-CM | POA: Diagnosis not present

## 2014-10-17 DIAGNOSIS — T7840XA Allergy, unspecified, initial encounter: Secondary | ICD-10-CM | POA: Insufficient documentation

## 2014-10-17 DIAGNOSIS — F4310 Post-traumatic stress disorder, unspecified: Secondary | ICD-10-CM | POA: Insufficient documentation

## 2014-10-17 DIAGNOSIS — Z79899 Other long term (current) drug therapy: Secondary | ICD-10-CM | POA: Diagnosis not present

## 2014-10-17 DIAGNOSIS — I10 Essential (primary) hypertension: Secondary | ICD-10-CM | POA: Diagnosis not present

## 2014-10-17 DIAGNOSIS — Y998 Other external cause status: Secondary | ICD-10-CM | POA: Insufficient documentation

## 2014-10-17 DIAGNOSIS — F319 Bipolar disorder, unspecified: Secondary | ICD-10-CM | POA: Insufficient documentation

## 2014-10-17 DIAGNOSIS — Y9389 Activity, other specified: Secondary | ICD-10-CM | POA: Insufficient documentation

## 2014-10-17 DIAGNOSIS — Z720 Tobacco use: Secondary | ICD-10-CM | POA: Diagnosis not present

## 2014-10-17 MED ORDER — FAMOTIDINE IN NACL 20-0.9 MG/50ML-% IV SOLN
20.0000 mg | Freq: Once | INTRAVENOUS | Status: AC
  Administered 2014-10-17: 20 mg via INTRAVENOUS
  Filled 2014-10-17: qty 50

## 2014-10-17 MED ORDER — DIPHENHYDRAMINE HCL 50 MG/ML IJ SOLN
25.0000 mg | Freq: Once | INTRAMUSCULAR | Status: AC
  Administered 2014-10-17: 25 mg via INTRAVENOUS
  Filled 2014-10-17: qty 1

## 2014-10-17 MED ORDER — DEXAMETHASONE SODIUM PHOSPHATE 10 MG/ML IJ SOLN
10.0000 mg | Freq: Once | INTRAMUSCULAR | Status: AC
  Administered 2014-10-17: 10 mg via INTRAVENOUS
  Filled 2014-10-17: qty 1

## 2014-10-17 NOTE — Discharge Instructions (Signed)
Allergies  Allergies may happen from anything your body is sensitive to. This may be food, medicines, pollens, chemicals, and many other things. Food allergies can be severe and deadly.  HOME CARE  If you do not know what causes a reaction, keep a diary. Write down the foods you ate and the symptoms that followed. Avoid foods that cause reactions.  If you have red raised spots (hives) or a rash:  Take medicine as told by your doctor.  Use medicines for red raised spots and itching as needed.  Apply cold cloths (compresses) to the skin. Take a cool bath. Avoid hot baths or showers.  If you are severely allergic:  It is often necessary to go to the hospital after you have treated your reaction.  Wear your medical alert jewelry.  You and your family must learn how to give a allergy shot or use an allergy kit (anaphylaxis kit).  Always carry your allergy kit or shot with you. Use this medicine as told by your doctor if a severe reaction is occurring. GET HELP RIGHT AWAY IF:  You have trouble breathing or are making high-pitched whistling sounds (wheezing).  You have a tight feeling in your chest or throat.  You have a puffy (swollen) mouth.  You have red raised spots, puffiness (swelling), or itching all over your body.  You have had a severe reaction that was helped by your allergy kit or shot. The reaction can return once the medicine has worn off.  You think you are having a food allergy. Symptoms most often happen within 30 minutes of eating a food.  Your symptoms have not gone away within 2 days or are getting worse.  You have new symptoms.  You want to retest yourself with a food or drink you think causes an allergic reaction. Only do this under the care of a doctor. MAKE SURE YOU:   Understand these instructions.  Will watch your condition.  Will get help right away if you are not doing well or get worse. Document Released: 07/18/2012 Document Reviewed:  07/18/2012 [facility] Patient Information 2015 [place]. This information is not intended to replace advice given to you by your health care provider. Make sure you discuss any questions you have with your health care provider.

## 2014-10-17 NOTE — ED Notes (Signed)
He is resting quietly and arouses easily. He c/o blurred vision "I just can't focus on anything--I can't even read your nametag".  He denies any throat tightness and he is breathing normally.  He has no rash, nor any urticaria.

## 2014-10-17 NOTE — ED Notes (Signed)
Pt complaint of yellow jacket sting to right AC approx 1050; complaint of dizziness; denies other symptoms. Pin point redness from sting at site.

## 2014-10-17 NOTE — ED Provider Notes (Signed)
CSN: [id number]     Arrival date & time 10/17/14  1104 History   First MD Initiated Contact with Patient 10/17/14 1105     Chief Complaint  Patient presents with  . Allergic Reaction     (Consider location/radiation/quality/duration/timing/severity/associated sxs/prior Treatment) Patient is a 39 y.o. male presenting with allergic reaction. The history is provided by the patient.  Allergic Reaction Presenting symptoms: no difficulty breathing, no difficulty swallowing and no rash   Presenting symptoms comment:  Dizziness, trouble walking Severity:  Moderate Prior allergic episodes:  Insect allergies Context: insect bite/sting   Context comment:  Patient got in his vehicle today and was stung by a bee in the right arm proximally 15 minutes prior to arrival Relieved by:  None tried Worsened by:  Nothing tried Ineffective treatments:  None tried   Past Medical History  Diagnosis Date  . Leg pain, right   . Hypertension   . Depression   . Bipolar 1 disorder   . PTSD (post-traumatic stress disorder)   . Anxiety disorder   . Back pain    Past Surgical History  Procedure Laterality Date  . Chest tube insertion     No family history on file. History  Substance Use Topics  . Smoking status: Current Every Day Smoker -- 0.50 packs/day    Types: Cigarettes  . Smokeless tobacco: Never Used  . Alcohol Use: No     Comment: none since 2009    Review of Systems  HENT: Negative for trouble swallowing.   Skin: Negative for rash.  All other systems reviewed and are negative.     Allergies  Amoxicillin; Amoxicillin; Bee venom; Penicillins; Penicillins; Naproxen; and Tramadol  Home Medications   Prior to Admission medications   Medication Sig Start Date End Date Taking? Authorizing Provider  acetaminophen (TYLENOL) 500 MG tablet Take 2,000 mg by mouth every 4 (four) hours as needed for moderate pain.   Yes Historical Provider, MD  ALPRAZolam Prudy Feeler(XANAX) 1 MG tablet Take 1 mg by  mouth 4 (four) times daily as needed for anxiety.   Yes Historical Provider, MD  Aspirin-Acetaminophen-Caffeine (GOODY HEADACHE PO) Take 1 packet by mouth daily as needed (for pain).   Yes Historical Provider, MD  diphenhydramine-acetaminophen (TYLENOL PM) 25-500 MG TABS Take 2-4 tablets by mouth at bedtime as needed (sleep).   Yes Historical Provider, MD  PARoxetine (PAXIL) 20 MG tablet Take 20 mg by mouth daily.   Yes Historical Provider, MD  risperiDONE (RISPERDAL) 3 MG tablet Take 3 mg by mouth at bedtime.   Yes Historical Provider, MD  cyclobenzaprine (FLEXERIL) 10 MG tablet Take 1 tablet (10 mg total) by mouth 3 (three) times daily. Patient not taking: Reported on 10/17/2014 05/22/14   Vickki HearingStanley E Harrison, MD  gabapentin (NEURONTIN) 100 MG capsule Take 1 capsule (100 mg total) by mouth 3 (three) times daily. Patient not taking: Reported on 10/17/2014 06/14/14   Vickki HearingStanley E Harrison, MD  HYDROcodone-acetaminophen (NORCO/VICODIN) 5-325 MG per tablet Take 1 tablet by mouth every 6 (six) hours as needed for moderate pain. Patient not taking: Reported on 08/20/2014 06/11/14   Vickki HearingStanley E Harrison, MD  ibuprofen (ADVIL,MOTRIN) 800 MG tablet Take 1 tablet (800 mg total) by mouth every 8 (eight) hours as needed for mild pain. Patient not taking: Reported on 08/20/2014 04/17/14   Layla MawKristen N Ward, DO  methocarbamol (ROBAXIN) 500 MG tablet Take 1 tablet (500 mg total) by mouth 3 (three) times daily. Patient not taking: Reported on 10/17/2014 08/20/14  Tammy Triplett, PA-C  naproxen (NAPROSYN) 500 MG tablet Take 1 tablet (500 mg total) by mouth 2 (two) times daily. Patient not taking: Reported on 10/17/2014 08/15/14   Janne Napoleon, NP  oxyCODONE-acetaminophen (PERCOCET) 5-325 MG per tablet Take 1 tablet by mouth every 4 (four) hours as needed for moderate pain. Patient not taking: Reported on 04/17/2014 04/09/14   Dione Booze, MD  predniSONE (STERAPRED UNI-PAK) 10 MG tablet Take by mouth daily. 10 MG 12 DAY DOSE  PACK Patient not taking: Reported on 08/20/2014 05/01/14   Vickki Hearing, MD  traMADol (ULTRAM) 50 MG tablet Take 1 tablet (50 mg total) by mouth every 6 (six) hours as needed. Patient not taking: Reported on 10/17/2014 08/15/14   Janne Napoleon, NP   BP 135/82 mmHg  Pulse 77  Temp(Src) 98 F (36.7 C) (Oral)  Resp 20  SpO2 97% Physical Exam  Constitutional: He is oriented to person, place, and time. He appears well-developed and well-nourished. He appears distressed.  Appears anxious and uncomfortable  HENT:  Head: Normocephalic and atraumatic.  Mouth/Throat: Oropharynx is clear and moist.  No tongue, uvular or pharyngeal swelling  Eyes: Conjunctivae and EOM are normal. Pupils are equal, round, and reactive to light.  Neck: Normal range of motion. Neck supple.  Cardiovascular: Normal rate, regular rhythm and intact distal pulses.   No murmur heard. Pulmonary/Chest: Effort normal and breath sounds normal. No respiratory distress. He has no wheezes. He has no rales.  Abdominal: Soft. He exhibits no distension. There is no tenderness. There is no rebound and no guarding.  Musculoskeletal: Normal range of motion. He exhibits no edema or tenderness.  Neurological: He is alert and oriented to person, place, and time.  Skin: Skin is warm and dry. No rash noted. No erythema.  Red raised papules to the right ventral forearm  Psychiatric: He has a normal mood and affect. His behavior is normal.  Nursing note and vitals reviewed.   ED Course  Procedures (including critical care time) Labs Review Labs Reviewed - No data to display  Imaging Review No results found.   EKG Interpretation None      MDM   Final diagnoses:  Allergic reaction, initial encounter    Patient presenting today after a bee sting with allergic reaction. He is complaining of being lightheaded and dizzy. The bee sting was a proximally 15 minutes ago. He has been allergic to bees in the past with a similar  reaction. He denies any airway issues and has never had throat swelling or breathing difficulty in the past either. He has no rash but there is a notable bee sting on his right upper arm. Patient has no oral swelling and has clear breath sounds.  Will give patient a dose of Benadryl, Pepcid and Decadron.  12:49 PM Pt feeling better will d/c home.  Gwyneth Sprout, MD 10/17/14 1249

## 2015-01-28 ENCOUNTER — Ambulatory Visit: Payer: Self-pay | Admitting: Family Medicine

## 2015-01-29 ENCOUNTER — Encounter: Payer: Self-pay | Admitting: Family Medicine

## 2015-03-28 ENCOUNTER — Emergency Department (HOSPITAL_COMMUNITY)
Admission: EM | Admit: 2015-03-28 | Discharge: 2015-03-28 | Disposition: A | Discharge status: Home / Self Care | Payer: Medicaid Other | Attending: Emergency Medicine | Admitting: Emergency Medicine

## 2015-03-28 ENCOUNTER — Encounter (HOSPITAL_COMMUNITY): Payer: Self-pay | Admitting: *Deleted

## 2015-03-28 DIAGNOSIS — F419 Anxiety disorder, unspecified: Secondary | ICD-10-CM | POA: Insufficient documentation

## 2015-03-28 DIAGNOSIS — L0390 Cellulitis, unspecified: Secondary | ICD-10-CM

## 2015-03-28 DIAGNOSIS — Z880 Allergy status to penicillin: Secondary | ICD-10-CM | POA: Insufficient documentation

## 2015-03-28 DIAGNOSIS — Z79899 Other long term (current) drug therapy: Secondary | ICD-10-CM | POA: Diagnosis not present

## 2015-03-28 DIAGNOSIS — F17210 Nicotine dependence, cigarettes, uncomplicated: Secondary | ICD-10-CM | POA: Insufficient documentation

## 2015-03-28 DIAGNOSIS — I10 Essential (primary) hypertension: Secondary | ICD-10-CM | POA: Insufficient documentation

## 2015-03-28 DIAGNOSIS — L0291 Cutaneous abscess, unspecified: Secondary | ICD-10-CM

## 2015-03-28 DIAGNOSIS — L03119 Cellulitis of unspecified part of limb: Secondary | ICD-10-CM | POA: Diagnosis not present

## 2015-03-28 DIAGNOSIS — L02414 Cutaneous abscess of left upper limb: Secondary | ICD-10-CM | POA: Diagnosis present

## 2015-03-28 DIAGNOSIS — F4310 Post-traumatic stress disorder, unspecified: Secondary | ICD-10-CM | POA: Diagnosis not present

## 2015-03-28 DIAGNOSIS — R000 Tachycardia, unspecified: Secondary | ICD-10-CM | POA: Diagnosis not present

## 2015-03-28 DIAGNOSIS — F329 Major depressive disorder, single episode, unspecified: Secondary | ICD-10-CM | POA: Diagnosis not present

## 2015-03-28 LAB — CBC WITH DIFFERENTIAL/PLATELET
Basophils Absolute: 0 10*3/uL (ref 0.0–0.1)
Basophils Relative: 0 %
EOS PCT: 0 %
Eosinophils Absolute: 0 10*3/uL (ref 0.0–0.7)
HCT: 39.2 % (ref 39.0–52.0)
HEMOGLOBIN: 13.6 g/dL (ref 13.0–17.0)
LYMPHS PCT: 13 %
Lymphs Abs: 1.3 10*3/uL (ref 0.7–4.0)
MCH: 32.7 pg (ref 26.0–34.0)
MCHC: 34.7 g/dL (ref 30.0–36.0)
MCV: 94.2 fL (ref 78.0–100.0)
Monocytes Absolute: 1 10*3/uL (ref 0.1–1.0)
Monocytes Relative: 10 %
NEUTROS PCT: 77 %
Neutro Abs: 7.9 10*3/uL — ABNORMAL HIGH (ref 1.7–7.7)
PLATELETS: 324 10*3/uL (ref 150–400)
RBC: 4.16 MIL/uL — AB (ref 4.22–5.81)
RDW: 13 % (ref 11.5–15.5)
WBC: 10.2 10*3/uL (ref 4.0–10.5)

## 2015-03-28 MED ORDER — DOXYCYCLINE HYCLATE 100 MG PO CAPS: 100.0000 mg | ORAL_CAPSULE | Freq: Two times a day (BID) | ORAL | Status: DC

## 2015-03-28 MED ORDER — DOXYCYCLINE HYCLATE 100 MG PO TABS
100.0000 mg | ORAL_TABLET | Freq: Once | ORAL | Status: AC
  Administered 2015-03-28: 100 mg via ORAL
  Filled 2015-03-28: qty 1

## 2015-03-28 MED ORDER — DICLOFENAC SODIUM 50 MG PO TBEC: 50.0000 mg | DELAYED_RELEASE_TABLET | Freq: Two times a day (BID) | ORAL | Status: DC

## 2015-03-28 MED ORDER — HYDROCODONE-ACETAMINOPHEN 5-325 MG PO TABS
1.0000 | ORAL_TABLET | Freq: Once | ORAL | Status: AC
  Administered 2015-03-28: 1 via ORAL
  Filled 2015-03-28: qty 1

## 2015-03-28 NOTE — ED Notes (Signed)
MD at bedside. 

## 2015-03-28 NOTE — Discharge Instructions (Signed)
Take the medication as directed and return in 2 days for recheck. Return sooner for worsening symptoms.   Cellulitis Cellulitis is an infection of the skin and the tissue beneath it. The infected area is usually red and tender. Cellulitis occurs most often in the arms and lower legs.  CAUSES  Cellulitis is caused by bacteria that enter the skin through cracks or cuts in the skin. The most common types of bacteria that cause cellulitis are staphylococci and streptococci. SIGNS AND SYMPTOMS   Redness and warmth.  Swelling.  Tenderness or pain.  Fever. DIAGNOSIS  Your health care provider can usually determine what is wrong based on a physical exam. Blood tests may also be done. TREATMENT  Treatment usually involves taking an antibiotic medicine. HOME CARE INSTRUCTIONS   Take your antibiotic medicine as directed by your health care provider. Finish the antibiotic even if you start to feel better.  Keep the infected arm or leg elevated to reduce swelling.  Apply a warm cloth to the affected area up to 4 times per day to relieve pain.  Take medicines only as directed by your health care provider.  Keep all follow-up visits as directed by your health care provider. SEEK MEDICAL CARE IF:   You notice red streaks coming from the infected area.  Your red area gets larger or turns dark in color.  Your bone or joint underneath the infected area becomes painful after the skin has healed.  Your infection returns in the same area or another area.  You notice a swollen bump in the infected area.  You develop new symptoms.  You have a fever. SEEK IMMEDIATE MEDICAL CARE IF:   You feel very sleepy.  You develop vomiting or diarrhea.  You have a general ill feeling (malaise) with muscle aches and pains.   This information is not intended to replace advice given to you by your health care provider. Make sure you discuss any questions you have with your health care provider.     Document Released: 12/31/2004 Document Revised: 12/12/2014 Document Reviewed: 06/08/2011 Elsevier Interactive Patient Education [employer]

## 2015-03-28 NOTE — ED Provider Notes (Signed)
CSN: [id number]     Arrival date & time 03/28/15  1924 History   First MD Initiated Contact with Patient 03/28/15 2000     Chief Complaint  Patient presents with  . Abscess     (Consider location/radiation/quality/duration/timing/severity/associated sxs/prior Treatment) HPI Theodore Crawford is a 39 y.o. male who presents to the ED with pain and redness to the left arm at the [facility]. Patient states the redness started this morning and has gotten progressively worse during the course of the day. He reports carrying wood yesterday and noted the area this morning.  Patient admits to IV drug use but states has not used in a few months.   Past Medical History  Diagnosis Date  . Leg pain, right   . Hypertension   . Depression   . Bipolar 1 disorder (HCC)   . PTSD (post-traumatic stress disorder)   . Anxiety disorder   . Back pain    Past Surgical History  Procedure Laterality Date  . Chest tube insertion     History reviewed. No pertinent family history. Social History  Substance Use Topics  . Smoking status: Current Every Day Smoker -- 0.50 packs/day    Types: Cigarettes  . Smokeless tobacco: Never Used  . Alcohol Use: No     Comment: occ. use    Review of Systems Negative except as stated in HPI   Allergies  Amoxicillin; Amoxicillin; Bee venom; Penicillins; Penicillins; Naproxen; and Tramadol  Home Medications   Prior to Admission medications   Medication Sig Start Date End Date Taking? Authorizing Provider  acetaminophen (TYLENOL) 500 MG tablet Take 2,000 mg by mouth every 4 (four) hours as needed for moderate pain.    Historical Provider, MD  ALPRAZolam Prudy Feeler) 1 MG tablet Take 1 mg by mouth 4 (four) times daily as needed for anxiety.    Historical Provider, MD  Aspirin-Acetaminophen-Caffeine (GOODY HEADACHE PO) Take 1 packet by mouth daily as needed (for pain).    Historical Provider, MD  cyclobenzaprine (FLEXERIL) 10 MG tablet Take 1 tablet (10 mg total) by mouth 3  (three) times daily. Patient not taking: Reported on 10/17/2014 05/22/14   Vickki Hearing, MD  diclofenac (VOLTAREN) 50 MG EC tablet Take 1 tablet (50 mg total) by mouth 2 (two) times daily. 03/28/15   Hope Orlene Och, NP  diphenhydramine-acetaminophen (TYLENOL PM) 25-500 MG TABS Take 2-4 tablets by mouth at bedtime as needed (sleep).    Historical Provider, MD  doxycycline (VIBRAMYCIN) 100 MG capsule Take 1 capsule (100 mg total) by mouth 2 (two) times daily. 03/28/15   Hope Orlene Och, NP  gabapentin (NEURONTIN) 100 MG capsule Take 1 capsule (100 mg total) by mouth 3 (three) times daily. Patient not taking: Reported on 10/17/2014 06/14/14   Vickki Hearing, MD  HYDROcodone-acetaminophen (NORCO/VICODIN) 5-325 MG per tablet Take 1 tablet by mouth every 6 (six) hours as needed for moderate pain. Patient not taking: Reported on 08/20/2014 06/11/14   Vickki Hearing, MD  methocarbamol (ROBAXIN) 500 MG tablet Take 1 tablet (500 mg total) by mouth 3 (three) times daily. Patient not taking: Reported on 10/17/2014 08/20/14   Tammy Triplett, PA-C  oxyCODONE-acetaminophen (PERCOCET) 5-325 MG per tablet Take 1 tablet by mouth every 4 (four) hours as needed for moderate pain. Patient not taking: Reported on 04/17/2014 04/09/14   Dione Booze, MD  PARoxetine (PAXIL) 20 MG tablet Take 20 mg by mouth daily.    Historical Provider, MD  predniSONE (STERAPRED UNI-PAK) 10 MG  tablet Take by mouth daily. 10 MG 12 DAY DOSE PACK Patient not taking: Reported on 08/20/2014 05/01/14   Vickki HearingStanley E Harrison, MD  risperiDONE (RISPERDAL) 3 MG tablet Take 3 mg by mouth at bedtime.    Historical Provider, MD   BP 145/91 mmHg  Pulse 128  Temp(Src) 97.9 F (36.6 C) (Oral)  Resp 20  Ht 6' (1.829 m)  Wt 79.379 kg  BMI 23.73 kg/m2  SpO2 96% Physical Exam  Constitutional: He is oriented to person, place, and time. He appears well-developed and well-nourished.  HENT:  Head: Normocephalic and atraumatic.  Eyes: EOM are normal.  Neck:  Neck supple.  Cardiovascular: Tachycardia present.   Pulmonary/Chest: Effort normal.  Musculoskeletal:       Arms: Firm tender area left AC with erythema. Radial pulses 2+, adequate circulation.    Neurological: He is alert and oriented to person, place, and time. No cranial nerve deficit.  Skin: Skin is warm and dry. There is erythema.  Psychiatric: He has a normal mood and affect. His behavior is normal.  Nursing note and vitals reviewed.   ED Course  Procedures (including critical care time) Dr. Manus Gunningancour in to exam examine the patient and do ultrasound of the area.  Area cleaned with betadine Needle aspiration without any purulent drainage.   Labs Review Labs Reviewed  CBC WITH DIFFERENTIAL/PLATELET - Abnormal; Notable for the following:    RBC 4.16 (*)    Neutro Abs 7.9 (*)    All other components within normal limits     MDM  39 y.o. male with pain, [facility] and [facility]. Stable for d/c without fever and does not appear toxic. Will start antibiotics and he will return in 2 days for recheck. He will return sooner for worsening symptoms.   Final diagnoses:  Cellulitis and abscess       Janne NapoleonHope M Neese, NP 03/29/15 [id number]  Glynn OctaveStephen Rancour, MD 03/29/15 [phone]

## 2015-03-28 NOTE — ED Notes (Signed)
Pt with small red area to left AC area this morning but over the day area has gotten bigger.  Pt states he was carrying wood yesterday, denies itching, c/o pain

## 2015-03-30 ENCOUNTER — Encounter (HOSPITAL_COMMUNITY): Payer: Self-pay | Admitting: *Deleted

## 2015-03-30 ENCOUNTER — Inpatient Hospital Stay (HOSPITAL_COMMUNITY)
Admission: EM | Admit: 2015-03-30 | Discharge: 2015-04-01 | DRG: 603 | Disposition: A | Discharge status: Home / Self Care | Payer: Medicaid Other | Attending: Internal Medicine | Admitting: Internal Medicine

## 2015-03-30 DIAGNOSIS — F319 Bipolar disorder, unspecified: Secondary | ICD-10-CM | POA: Diagnosis present

## 2015-03-30 DIAGNOSIS — F1310 Sedative, hypnotic or anxiolytic abuse, uncomplicated: Secondary | ICD-10-CM | POA: Diagnosis present

## 2015-03-30 DIAGNOSIS — M549 Dorsalgia, unspecified: Secondary | ICD-10-CM | POA: Diagnosis not present

## 2015-03-30 DIAGNOSIS — G8929 Other chronic pain: Secondary | ICD-10-CM | POA: Diagnosis present

## 2015-03-30 DIAGNOSIS — M79602 Pain in left arm: Secondary | ICD-10-CM | POA: Diagnosis not present

## 2015-03-30 DIAGNOSIS — I10 Essential (primary) hypertension: Secondary | ICD-10-CM | POA: Diagnosis present

## 2015-03-30 DIAGNOSIS — F17210 Nicotine dependence, cigarettes, uncomplicated: Secondary | ICD-10-CM | POA: Diagnosis present

## 2015-03-30 DIAGNOSIS — L03114 Cellulitis of left upper limb: Principal | ICD-10-CM | POA: Diagnosis present

## 2015-03-30 DIAGNOSIS — F329 Major depressive disorder, single episode, unspecified: Secondary | ICD-10-CM | POA: Diagnosis not present

## 2015-03-30 DIAGNOSIS — F419 Anxiety disorder, unspecified: Secondary | ICD-10-CM | POA: Diagnosis not present

## 2015-03-30 DIAGNOSIS — F32A Depression, unspecified: Secondary | ICD-10-CM | POA: Diagnosis present

## 2015-03-30 DIAGNOSIS — F4310 Post-traumatic stress disorder, unspecified: Secondary | ICD-10-CM | POA: Diagnosis present

## 2015-03-30 HISTORY — DX: Other psychoactive substance use, unspecified, uncomplicated: F19.90

## 2015-03-30 LAB — CBC WITH DIFFERENTIAL/PLATELET
BASOS ABS: 0 10*3/uL (ref 0.0–0.1)
BASOS PCT: 0 %
EOS ABS: 0 10*3/uL (ref 0.0–0.7)
Eosinophils Relative: 0 %
HEMATOCRIT: 39.2 % (ref 39.0–52.0)
Hemoglobin: 13.6 g/dL (ref 13.0–17.0)
Lymphocytes Relative: 11 %
Lymphs Abs: 1.3 10*3/uL (ref 0.7–4.0)
MCH: 32.9 pg (ref 26.0–34.0)
MCHC: 34.7 g/dL (ref 30.0–36.0)
MCV: 94.7 fL (ref 78.0–100.0)
MONO ABS: 1.3 10*3/uL — AB (ref 0.1–1.0)
MONOS PCT: 11 %
NEUTROS ABS: 9.1 10*3/uL — AB (ref 1.7–7.7)
NEUTROS PCT: 78 %
PLATELETS: 301 10*3/uL (ref 150–400)
RBC: 4.14 MIL/uL — ABNORMAL LOW (ref 4.22–5.81)
RDW: 13 % (ref 11.5–15.5)
WBC: 11.7 10*3/uL — ABNORMAL HIGH (ref 4.0–10.5)

## 2015-03-30 LAB — BASIC METABOLIC PANEL
ANION GAP: 10 (ref 5–15)
BUN: 9 mg/dL (ref 6–20)
CALCIUM: 9.3 mg/dL (ref 8.9–10.3)
CO2: 25 mmol/L (ref 22–32)
CREATININE: 0.9 mg/dL (ref 0.61–1.24)
Chloride: 100 mmol/L — ABNORMAL LOW (ref 101–111)
GLUCOSE: 121 mg/dL — AB (ref 65–99)
Potassium: 3.5 mmol/L (ref 3.5–5.1)
Sodium: 135 mmol/L (ref 135–145)

## 2015-03-30 LAB — I-STAT CG4 LACTIC ACID, ED: LACTIC ACID, VENOUS: 0.97 mmol/L (ref 0.5–2.0)

## 2015-03-30 MED ORDER — SODIUM CHLORIDE 0.9 % IV SOLN
INTRAVENOUS | Status: AC
  Filled 2015-03-30: qty 500

## 2015-03-30 MED ORDER — FENTANYL CITRATE (PF) 100 MCG/2ML IJ SOLN
50.0000 ug | INTRAMUSCULAR | Status: DC | PRN
  Administered 2015-03-30: 50 ug via INTRAVENOUS
  Filled 2015-03-30: qty 2

## 2015-03-30 MED ORDER — ENOXAPARIN SODIUM 40 MG/0.4ML ~~LOC~~ SOLN
40.0000 mg | SUBCUTANEOUS | Status: DC
  Administered 2015-03-30 – 2015-03-31 (×2): 40 mg via SUBCUTANEOUS
  Filled 2015-03-30 (×3): qty 0.4

## 2015-03-30 MED ORDER — RISPERIDONE 1 MG PO TABS
2.0000 mg | ORAL_TABLET | Freq: Every day | ORAL | Status: DC
  Administered 2015-03-30 – 2015-03-31 (×2): 2 mg via ORAL
  Filled 2015-03-30 (×2): qty 2

## 2015-03-30 MED ORDER — CLINDAMYCIN PHOSPHATE 900 MG/50ML IV SOLN
INTRAVENOUS | Status: AC
  Filled 2015-03-30: qty 50

## 2015-03-30 MED ORDER — MORPHINE SULFATE (PF) 2 MG/ML IV SOLN
2.0000 mg | INTRAVENOUS | Status: DC | PRN
Start: 2015-03-30 — End: 2015-04-01
  Administered 2015-03-30 – 2015-04-01 (×16): 2 mg via INTRAVENOUS
  Filled 2015-03-30 (×16): qty 1

## 2015-03-30 MED ORDER — BUPROPION HCL ER (XL) 150 MG PO TB24
150.0000 mg | ORAL_TABLET | Freq: Every day | ORAL | Status: DC
  Administered 2015-03-31 – 2015-04-01 (×2): 150 mg via ORAL
  Filled 2015-03-30 (×4): qty 1

## 2015-03-30 MED ORDER — KETOROLAC TROMETHAMINE 30 MG/ML IJ SOLN
30.0000 mg | Freq: Four times a day (QID) | INTRAMUSCULAR | Status: AC
Start: 2015-03-30 — End: 2015-04-01
  Administered 2015-03-30 – 2015-04-01 (×8): 30 mg via INTRAVENOUS
  Filled 2015-03-30 (×9): qty 1

## 2015-03-30 MED ORDER — ONDANSETRON HCL 4 MG/2ML IJ SOLN
4.0000 mg | Freq: Four times a day (QID) | INTRAMUSCULAR | Status: DC | PRN
Start: 2015-03-30 — End: 2015-04-01

## 2015-03-30 MED ORDER — CLINDAMYCIN PHOSPHATE 900 MG/50ML IV SOLN
900.0000 mg | Freq: Three times a day (TID) | INTRAVENOUS | Status: DC
  Administered 2015-03-30 – 2015-04-01 (×5): 900 mg via INTRAVENOUS
  Filled 2015-03-30 (×9): qty 50

## 2015-03-30 MED ORDER — ACETAMINOPHEN 650 MG RE SUPP: 650.0000 mg | Freq: Four times a day (QID) | RECTAL | Status: DC | PRN

## 2015-03-30 MED ORDER — NICOTINE 21 MG/24HR TD PT24
21.0000 mg | MEDICATED_PATCH | Freq: Every day | TRANSDERMAL | Status: DC
  Administered 2015-03-30 – 2015-04-01 (×3): 21 mg via TRANSDERMAL
  Filled 2015-03-30 (×3): qty 1

## 2015-03-30 MED ORDER — PAROXETINE HCL 20 MG PO TABS
20.0000 mg | ORAL_TABLET | Freq: Every day | ORAL | Status: DC
  Administered 2015-03-31 – 2015-04-01 (×2): 20 mg via ORAL
  Filled 2015-03-30 (×2): qty 1

## 2015-03-30 MED ORDER — VANCOMYCIN HCL 10 G IV SOLR
1500.0000 mg | Freq: Two times a day (BID) | INTRAVENOUS | Status: DC
  Filled 2015-03-30 (×4): qty 1500

## 2015-03-30 MED ORDER — CLINDAMYCIN PHOSPHATE 900 MG/50ML IV SOLN
900.0000 mg | Freq: Once | INTRAVENOUS | Status: AC
  Administered 2015-03-30: 900 mg via INTRAVENOUS
  Filled 2015-03-30: qty 50

## 2015-03-30 MED ORDER — POTASSIUM CHLORIDE IN NACL 40-0.9 MEQ/L-% IV SOLN
INTRAVENOUS | Status: DC
  Administered 2015-03-30 – 2015-04-01 (×4): 100 mL/h via INTRAVENOUS

## 2015-03-30 MED ORDER — ALPRAZOLAM 1 MG PO TABS
1.0000 mg | ORAL_TABLET | Freq: Four times a day (QID) | ORAL | Status: DC | PRN
  Administered 2015-03-30 – 2015-04-01 (×7): 1 mg via ORAL
  Filled 2015-03-30 (×7): qty 1

## 2015-03-30 MED ORDER — VANCOMYCIN HCL IN DEXTROSE 1-5 GM/200ML-% IV SOLN: 1000.0000 mg | Freq: Once | INTRAVENOUS | Status: DC

## 2015-03-30 MED ORDER — ONDANSETRON HCL 4 MG PO TABS
4.0000 mg | ORAL_TABLET | Freq: Four times a day (QID) | ORAL | Status: DC | PRN
Start: 2015-03-30 — End: 2015-04-01

## 2015-03-30 MED ORDER — SODIUM CHLORIDE 0.9 % IV SOLN: 500.0000 mg | Freq: Once | INTRAVENOUS | Status: DC

## 2015-03-30 MED ORDER — ACETAMINOPHEN 325 MG PO TABS: 650.0000 mg | ORAL_TABLET | Freq: Four times a day (QID) | ORAL | Status: DC | PRN

## 2015-03-30 NOTE — Progress Notes (Signed)
ANTIBIOTIC CONSULT NOTE - INITIAL  Pharmacy Consult for Clindamycin Indication: cellulitis  Allergies  Allergen Reactions  . Amoxicillin Anaphylaxis    swelling  . Bee Venom Anaphylaxis  . Penicillins Anaphylaxis    Has patient had a PCN reaction causing immediate rash, facial/tongue/throat swelling, SOB or lightheadedness with hypotension: Yesyes Has patient had a PCN reaction causing severe rash involving mucus membranes or skin necrosis: Nono Has patient had a PCN reaction that required hospitalization Nono Has patient had a PCN reaction occurring within the last 10 years: Nono If all of the above answers are "NO", then may proceed with Cephalosporin  . Naproxen     Made sick on stomach.    . Tramadol     Made sick to stomach    Patient Measurements: Height: 6' (182.9 cm) Weight: 180 lb (81.647 kg) IBW/kg (Calculated) : 77.6 Adjusted Body Weight:   Vital Signs: Temp: 99.2 F (37.3 C) (12/24 1637) Temp Source: Oral (12/24 1637) BP: 131/94 mmHg (12/24 1637) Pulse Rate: 89 (12/24 1637) Intake/Output from previous day:   Intake/Output from this shift:    Labs:  Recent Labs  03/28/15 2036 03/30/15 1350  WBC 10.2 11.7*  HGB 13.6 13.6  PLT 324 301  CREATININE  --  0.90   Estimated Creatinine Clearance: 123.3 mL/min (by C-G formula based on Cr of 0.9). No results for input(s): VANCOTROUGH, VANCOPEAK, VANCORANDOM, GENTTROUGH, GENTPEAK, GENTRANDOM, TOBRATROUGH, TOBRAPEAK, TOBRARND, AMIKACINPEAK, AMIKACINTROU, AMIKACIN in the last 72 hours.   Microbiology: No results found for this or any previous visit (from the past 720 hour(s)).  Medical History: Past Medical History  Diagnosis Date  . Leg pain, right   . Hypertension   . Depression   . Bipolar 1 disorder (HCC)   . PTSD (post-traumatic stress disorder)   . Anxiety disorder   . Back pain   . IVDU (intravenous drug user)     Medications:  Prescriptions prior to admission  Medication Sig Dispense Refill  Last Dose  . acetaminophen (TYLENOL) 500 MG tablet Take 2,000 mg by mouth every 4 (four) hours as needed for moderate pain.   unknown  . ALPRAZolam (XANAX) 1 MG tablet Take 1 mg by mouth 4 (four) times daily as needed for anxiety.   03/30/2015 at Unknown time  . Aspirin-Acetaminophen-Caffeine (GOODY HEADACHE PO) Take 1 packet by mouth daily as needed (for pain).   03/29/2015 at Unknown time  . buPROPion (WELLBUTRIN XL) 150 MG 24 hr tablet Take 150 mg by mouth daily.   03/30/2015 at Unknown time  . diclofenac (VOLTAREN) 50 MG EC tablet Take 1 tablet (50 mg total) by mouth 2 (two) times daily. 10 tablet 0 03/30/2015 at Unknown time  . PARoxetine (PAXIL) 20 MG tablet Take 20 mg by mouth daily.   03/30/2015 at Unknown time  . risperiDONE (RISPERDAL) 2 MG tablet Take 2 mg by mouth.   03/30/2015 at Unknown time   Assessment: Clindamycin for cellulitis left arm. Worsening cellulitis   Goal of Therapy:  Eradicate infection  Plan:  Clindamycin 900 mg IV every 8 hours Labs per protocol F/U oral therapy when appropriate  Raquel JamesPittman, Zaleah Ternes Bennett 03/30/2015,5:37 PM

## 2015-03-30 NOTE — ED Provider Notes (Signed)
CSN: [id number]     Arrival date & time 03/30/15  1224 History   First MD Initiated Contact with Patient 03/30/15 1317     Chief Complaint  Patient presents with  . Arm Infection       HPI Pt was seen at 1325.  Per pt and his family, c/o gradual onset and worsening of persistent left AC area "rash" that began 3 days ago, worse over the past 2 days. Pt states he was evaluated in the ED 2 days ago for this complaint, dx cellulitis, rx doxycycline. States he has been taking the antibiotic as prescribed. Endorses last IVDU was several months ago. Denies more recent use. Denies injury to the area. Denies fevers, no other areas of rash, no focal motor weakness, no tingling/numbness in extremities.   Past Medical History  Diagnosis Date  . Leg pain, right   . Hypertension   . Depression   . Bipolar 1 disorder (HCC)   . PTSD (post-traumatic stress disorder)   . Anxiety disorder   . Back pain   . IVDU (intravenous drug user)    Past Surgical History  Procedure Laterality Date  . Chest tube insertion      Social History  Substance Use Topics  . Smoking status: Current Every Day Smoker -- 0.50 packs/day    Types: Cigarettes  . Smokeless tobacco: Never Used  . Alcohol Use: No     Comment: occ. use    Review of Systems ROS: Statement: All systems negative except as marked or noted in the HPI; Constitutional: Negative for fever and chills. ; ; Eyes: Negative for eye pain, redness and discharge. ; ; ENMT: Negative for ear pain, hoarseness, nasal congestion, sinus pressure and sore throat. ; ; Cardiovascular: Negative for chest pain, palpitations, diaphoresis, dyspnea and peripheral edema. ; ; Respiratory: Negative for cough, wheezing and stridor. ; ; Gastrointestinal: Negative for nausea, vomiting, diarrhea, abdominal pain, blood in stool, hematemesis, jaundice and rectal bleeding. . ; ; Genitourinary: Negative for dysuria, flank pain and hematuria. ; ; Musculoskeletal: Negative for back  pain and neck pain. Negative for swelling and trauma.; ; Skin: +rash. Negative for pruritus, abrasions, blisters, bruising and skin lesion.; ; Neuro: Negative for headache, lightheadedness and neck stiffness. Negative for weakness, altered level of consciousness , altered mental status, extremity weakness, paresthesias, involuntary movement, seizure and syncope.      Allergies  Amoxicillin; Bee venom; Penicillins; Naproxen; and Tramadol  Home Medications   Prior to Admission medications   Medication Sig Start Date End Date Taking? Authorizing Provider  acetaminophen (TYLENOL) 500 MG tablet Take 2,000 mg by mouth every 4 (four) hours as needed for moderate pain.   Yes Historical Provider, MD  ALPRAZolam Prudy Feeler(XANAX) 1 MG tablet Take 1 mg by mouth 4 (four) times daily as needed for anxiety.   Yes Historical Provider, MD  Aspirin-Acetaminophen-Caffeine (GOODY HEADACHE PO) Take 1 packet by mouth daily as needed (for pain).   Yes Historical Provider, MD  buPROPion (WELLBUTRIN XL) 150 MG 24 hr tablet Take 150 mg by mouth daily.   Yes Historical Provider, MD  diclofenac (VOLTAREN) 50 MG EC tablet Take 1 tablet (50 mg total) by mouth 2 (two) times daily. 03/28/15  Yes Hope Orlene OchM Neese, NP  doxycycline (VIBRAMYCIN) 100 MG capsule Take 1 capsule (100 mg total) by mouth 2 (two) times daily. 03/28/15  Yes Hope Orlene OchM Neese, NP  PARoxetine (PAXIL) 20 MG tablet Take 20 mg by mouth daily.   Yes Historical Provider,  MD  risperiDONE (RISPERDAL) 2 MG tablet Take 2 mg by mouth.   Yes Historical Provider, MD   BP 130/90 mmHg  Pulse 109  Temp(Src) 98.4 F (36.9 C) (Oral)  Resp 16  Ht 6' (1.829 m)  Wt 180 lb (81.647 kg)  BMI 24.41 kg/m2  SpO2 99% Physical Exam  1330: Physical examination:  Nursing notes reviewed; Vital signs and O2 SAT reviewed;  Constitutional: Well developed, Well nourished, Well hydrated, In no acute distress; Head:  Normocephalic, atraumatic; Eyes: EOMI, PERRL, No scleral icterus; ENMT: Mouth and  pharynx normal, Mucous membranes moist; Neck: Supple, Full range of motion, No lymphadenopathy; Cardiovascular: Tachycardic rate and rhythm, No gallop; Respiratory: Breath sounds clear & equal bilaterally, No wheezes.  Speaking full sentences with ease, Normal respiratory effort/excursion; Chest: Nontender, Movement normal; Abdomen: Soft, Nontender, Nondistended, Normal bowel sounds; Genitourinary: No CVA tenderness; Extremities: Pulses normal, No deformity. No open wounds. No ecchymosis. No soft tissue crepitus. +left AC with approximately 5cm area of erythema, induration, warmth, tenderness with erythema extending to entire [facility] area and down proximal volar forearm.; Neuro: AA&Ox3, Major CN grossly intact.  Speech clear. No gross focal motor or sensory deficits in extremities.; Skin: Color normal, Warm, Dry.   ED Course  Procedures (including critical care time)  EMERGENCY DEPARTMENT US SOFT TISSUE INTERPRETATION "Study: Limited Ultrasound of the noted body part in comments below" INDICATIONS: Soft tissue infection Multiple views of the body part are obtained with a multi-frequency linear probe PERFORMED BY:  Myself IMAGES ARCHIVED?: No SIDE:Left BODY PART:Upper extremity (left AC) FINDINGS: Cellulitis present LIMITATIONS:  Emergent Procedure INTERPRETATION:  Cellulitis present COMMENT:  Cobblestoning of tissues. No clear/discrete abscess.    Labs Review Imaging Review I have personally reviewed and evaluated these images and lab results as part of my medical decision-making.   EKG Interpretation None      MDM  MDM Reviewed: previous chart, nursing note and vitals Reviewed previous: labs Interpretation: labs      Results for orders placed or performed during the hospital encounter of 03/30/15  Basic metabolic panel  Result Value Ref Range   Sodium 135 135 - 145 mmol/L   Potassium 3.5 3.5 - 5.1 mmol/L   Chloride 100 (L) 101 - 111 mmol/L   CO2 25 22 - 32 mmol/L   Glucose,  Bld 121 (H) 65 - 99 mg/dL   BUN 9 6 - 20 mg/dL   Creatinine, Ser 9.14 0.61 - 1.24 mg/dL   Calcium 9.3 8.9 - 78.2 mg/dL   GFR calc non Af Amer >60 >60 mL/min   GFR calc Af Amer >60 >60 mL/min   Anion gap 10 5 - 15  CBC with Differential  Result Value Ref Range   WBC 11.7 (H) 4.0 - 10.5 K/uL   RBC 4.14 (L) 4.22 - 5.81 MIL/uL   Hemoglobin 13.6 13.0 - 17.0 g/dL   HCT 95.6 21.3 - 08.6 %   MCV 94.7 78.0 - 100.0 fL   MCH 32.9 26.0 - 34.0 pg   MCHC 34.7 30.0 - 36.0 g/dL   RDW 57.8 46.9 - 62.9 %   Platelets 301 150 - 400 K/uL   Neutrophils Relative % 78 %   Neutro Abs 9.1 (H) 1.7 - 7.7 K/uL   Lymphocytes Relative 11 %   Lymphs Abs 1.3 0.7 - 4.0 K/uL   Monocytes Relative 11 %   Monocytes Absolute 1.3 (H) 0.1 - 1.0 K/uL   Eosinophils Relative 0 %   Eosinophils Absolute 0.0 0.0 -  0.7 K/uL   Basophils Relative 0 %   Basophils Absolute 0.0 0.0 - 0.1 K/uL  I-Stat CG4 Lactic Acid, ED  Result Value Ref Range   Lactic Acid, Venous 0.97 0.5 - 2.0 mmol/L    1505:  Failure of outpatient therapy; will admit for IV antibiotics. IV clindamycin given while in the ED. Dx and testing d/w pt and family.  Questions answered.  Verb understanding, agreeable to admit.  T/C to [facility] Dr. Kerry Hough, case discussed, including:  HPI, pertinent PM/SHx, VS/PE, dx testing, ED course and treatment:  Agreeable to admit, requests to write temporary orders, obtain medical bed to team APAdmits.     Samuel Jester, DO 04/02/15 2205

## 2015-03-30 NOTE — ED Notes (Signed)
Report given to Trula Orehristina, RN for room 303.

## 2015-03-30 NOTE — ED Notes (Signed)
Pt was recently seen for the same, 2 days ago. Pt has left arm redness and swelling in the bend of his arm. Pt was told to come back here if his infection was getting no better.

## 2015-03-30 NOTE — ED Notes (Signed)
MD at bedside. 

## 2015-03-30 NOTE — H&P (Signed)
[facility] History and Physical  Theodore Crawford YNW:[id number] DOB: Jan 07, 1976 DOA: 03/30/2015  Referring physician: Dr. Clarene Duke, ER PCP: Augustine Radar, MD   Chief Complaint: left arm pain  HPI: Theodore Crawford is a 39 y.o. male who presents to the emergency room with complaints of pain and swelling in his left arm. The patient notes that approximately 2 days ago he noticed a nodule in his left antecubital fossa. This progressively began to become painful and edematous. He came to the emergency room on 12/22 and was evaluated for cellulitis. Ultrasound done on his arm at that time did not indicate any underlying abscess and no fluid could be aspirated. He was sent home with a course of doxycycline, which she reports that he took. He is unsure whether he has had any fever, but does report having chills. He was told to return to the emergency room if his cellulitis did not improve. Today he returns to the emergency room with worsening  erythema, swelling and pain. He has not noticed any drainage from his skin. He was evaluated again in the emergency room with ultrasound and there was no discernible abscess. He's been referred for admissions for intravenous antibiotics.   Review of Systems:  Pertinent positives as per HPI, otherwise negative  Past Medical History  Diagnosis Date  . Leg pain, right   . Hypertension   . Depression   . Bipolar 1 disorder (HCC)   . PTSD (post-traumatic stress disorder)   . Anxiety disorder   . Back pain   . IVDU (intravenous drug user)    Past Surgical History  Procedure Laterality Date  . Chest tube insertion     Social History:  reports that he has been smoking Cigarettes.  He has been smoking about 0.50 packs per day. He has never used smokeless tobacco. He reports that he uses illicit drugs (Marijuana and IV). He reports that he does not drink alcohol.  Allergies  Allergen Reactions  . Amoxicillin Anaphylaxis    swelling  . Bee Venom  Anaphylaxis  . Penicillins Anaphylaxis    Has patient had a PCN reaction causing immediate rash, facial/tongue/throat swelling, SOB or lightheadedness with hypotension: Yesyes Has patient had a PCN reaction causing severe rash involving mucus membranes or skin necrosis: Yesno Has patient had a PCN reaction that required hospitalization Nono Has patient had a PCN reaction occurring within the last 10 years: Nono If all of the above answers are "NO", then may proceed with Cephalosporin  . Naproxen     Made sick on stomach.    . Tramadol     Made sick to stomach   Family History: no history of substance abuse in the family  Prior to Admission medications   Medication Sig Start Date End Date Taking? Authorizing Provider  acetaminophen (TYLENOL) 500 MG tablet Take 2,000 mg by mouth every 4 (four) hours as needed for moderate pain.   Yes Historical Provider, MD  ALPRAZolam Prudy Feeler) 1 MG tablet Take 1 mg by mouth 4 (four) times daily as needed for anxiety.   Yes Historical Provider, MD  Aspirin-Acetaminophen-Caffeine (GOODY HEADACHE PO) Take 1 packet by mouth daily as needed (for pain).   Yes Historical Provider, MD  buPROPion (WELLBUTRIN XL) 150 MG 24 hr tablet Take 150 mg by mouth daily.   Yes Historical Provider, MD  diclofenac (VOLTAREN) 50 MG EC tablet Take 1 tablet (50 mg total) by mouth 2 (two) times daily. 03/28/15  Yes Hope Orlene Och, NP  PARoxetine (PAXIL) 20 MG tablet Take 20 mg by mouth daily.   Yes Historical Provider, MD  risperiDONE (RISPERDAL) 2 MG tablet Take 2 mg by mouth.   Yes Historical Provider, MD   Physical Exam: Filed Vitals:   03/30/15 1236 03/30/15 1442  BP: 130/90 126/72  Pulse: 109 68  Temp: 98.4 F (36.9 C) 98.3 F (36.8 C)  TempSrc: Oral Oral  Resp: 16 13  Height: 6' (1.829 m)   Weight: 81.647 kg (180 lb)   SpO2: 99% 98%    Wt Readings from Last 3 Encounters:  03/30/15 81.647 kg (180 lb)  03/28/15 79.379 kg (175 lb)  10/13/14 84.369 kg (186 lb)     General:  Appears calm and comfortable Eyes: PERRL, normal lids, irises & conjunctiva ENT: grossly normal hearing, lips & tongue Neck: no LAD, masses or thyromegaly Cardiovascular: RRR, no m/r/g. No LE edema. Telemetry: SR, no arrhythmias  Respiratory: CTA bilaterally, no w/r/r. Normal respiratory effort. Abdomen: soft, ntnd Skin: erythema and induration noted in left AC, warm and tender to touch, no drainage noted. Musculoskeletal: grossly normal tone BUE/BLE Psychiatric: grossly normal mood and affect, speech fluent and appropriate Neurologic: grossly non-focal.          Labs on Admission:  Basic Metabolic Panel:  Recent Labs Lab 03/30/15 1350  NA 135  K 3.5  CL 100*  CO2 25  GLUCOSE 121*  BUN 9  CREATININE 0.90  CALCIUM 9.3   Liver Function Tests: No results for input(s): AST, ALT, ALKPHOS, BILITOT, PROT, ALBUMIN in the last 168 hours. No results for input(s): LIPASE, AMYLASE in the last 168 hours. No results for input(s): AMMONIA in the last 168 hours. CBC:  Recent Labs Lab 03/28/15 2036 03/30/15 1350  WBC 10.2 11.7*  NEUTROABS 7.9* 9.1*  HGB 13.6 13.6  HCT 39.2 39.2  MCV 94.2 94.7  PLT 324 301   Cardiac Enzymes: No results for input(s): CKTOTAL, CKMB, CKMBINDEX, TROPONINI in the last 168 hours.  BNP (last 3 results) No results for input(s): BNP in the last 8760 hours.  ProBNP (last 3 results) No results for input(s): PROBNP in the last 8760 hours.  CBG: No results for input(s): GLUCAP in the last 168 hours.  Radiological Exams on Admission: No results found.    Assessment/Plan Principal Problem:   Left arm cellulitis Active Problems:   Depression   Anxiety   1. Left arm cellulitis. Patient found to have a worsening cellulitis in his left antecubital fossa. He denies any recent intravenous drug use, but does admit to substance abuse in the past. He's been started on intravenous antibiotics per cellulitis orders. Will also check HIV  status. Continue pain management, keep arm elevated, continue NSAIDs as anti-inflammatory effect and warm compresses. 2. Depression. Continue outpatient regimen 3. Anxiety. Continue outpatient regimen.   Code Status: full code DVT Prophylaxis: lovenox Family Communication: discussed with patient and family at the bedside Disposition Plan: discharge home once improved  Time spent: 50mins  [facility] Valley Surgery CenterMEMON,JEHANZEB [facility] Pager [phone]

## 2015-03-31 LAB — BASIC METABOLIC PANEL
Anion gap: 7 (ref 5–15)
BUN: 11 mg/dL (ref 6–20)
CHLORIDE: 103 mmol/L (ref 101–111)
CO2: 24 mmol/L (ref 22–32)
CREATININE: 0.86 mg/dL (ref 0.61–1.24)
Calcium: 8.8 mg/dL — ABNORMAL LOW (ref 8.9–10.3)
GFR calc Af Amer: >60 mL/min (ref >60)
GFR calc non Af Amer: >60 mL/min (ref >60)
Glucose, Bld: 88 mg/dL (ref 65–99)
Potassium: 3.7 mmol/L (ref 3.5–5.1)
SODIUM: 134 mmol/L — AB (ref 135–145)

## 2015-03-31 LAB — CBC
HCT: 35.8 % — ABNORMAL LOW (ref 39.0–52.0)
Hemoglobin: 12.5 g/dL — ABNORMAL LOW (ref 13.0–17.0)
MCH: 32.9 pg (ref 26.0–34.0)
MCHC: 34.9 g/dL (ref 30.0–36.0)
MCV: 94.2 fL (ref 78.0–100.0)
PLATELETS: 280 10*3/uL (ref 150–400)
RBC: 3.8 MIL/uL — ABNORMAL LOW (ref 4.22–5.81)
RDW: 12.9 % (ref 11.5–15.5)
WBC: 7.2 10*3/uL (ref 4.0–10.5)

## 2015-03-31 LAB — HIV ANTIBODY (ROUTINE TESTING W REFLEX): HIV Screen 4th Generation wRfx: NONREACTIVE

## 2015-03-31 MED ORDER — DIATRIZOATE MEGLUMINE & SODIUM 66-10 % PO SOLN
ORAL | Status: AC
  Filled 2015-03-31: qty 30

## 2015-03-31 NOTE — Discharge Summary (Signed)
Physician Discharge Summary  Theodore Crawford ZOX:[id number] DOB: [birth year] DOA: 03/30/2015  PCP: Augustine Radar, MD  Admit date: 03/30/2015 Discharge date: 04/01/2015  Time spent: 35 minutes  Recommendations for Outpatient Follow-up:  1. Follow up with PCP within 1-2 weeks for resolution of cellulitis.  Discharge Diagnoses:  Principal Problem:   Left arm cellulitis Active Problems:   Depression   Anxiety   Discharge Condition: Improved   Diet recommendation: Heart healthy   Filed Weights   03/30/15 1236 03/30/15 1637  Weight: 81.647 kg (180 lb) 81.647 kg (180 lb)    History of present illness:  39 y.o. male who presents to the emergency room with complaints of pain and swelling in his left arm. The patient notes that approximately 2 days ago he noticed a nodule in his left antecubital fossa. This progressively began to become painful and edematous. He came to the emergency room on 12/22 and was evaluated for cellulitis. Ultrasound done on his arm at that time did not indicate any underlying abscess and no fluid could be aspirated. He was sent home with a course of doxycycline, which she reports that he took. He is unsure whether he has had any fever, but does report having chills. He was told to return to the emergency room if his cellulitis did not improve. Today he returns to the emergency room with worsening erythema, swelling and pain. He has not noticed any drainage from his skin. He was evaluated again in the emergency room with ultrasound and there was no discernible abscess. He's been referred for admissions for intravenous antibiotics.  Hospital Course:  Patient was admitted for worsening cellulitis in his left antecubital foss and started on IV abx per cellulitis orders. Nurse reports that overnight 12/24 the putsule popped with moderate amount of drainage and improvement in redness and appearance. Cellulitis on exam appears to be healing well. Upon discharge  transitioned to oral abx. Educated on proper outpatient wound care  1. Depression, continued outpatient regimen.  2. Anxiety, continued outpatient regimen.  3. Chronic pain. Patient has requested outpatient pain management clinic. Patient does not have a PCP. Assisted by Case management.  Procedures:  None  Consultations:  None  Discharge Exam: Filed Vitals:   03/31/15 2017 04/01/15 0436  BP: 109/75 132/82  Pulse: 74 75  Temp: 97.8 F (36.6 C) 98.5 F (36.9 C)  Resp: 16 16    General: NAD, looks comfortable  Cardiovascular: RRR, S1, S2   Respiratory: clear bilaterally, No wheezing, rales or rhonchi  Abdomen: soft, non tender, no distention , bowel sounds normal  Musculoskeletal: Dime size wound noted in left AC and draining serosanguinous drainage. Surrounding erythema is greatly improving. Induration and tenderness improving.  Discharge Instructions   Discharge Instructions    Diet - low sodium heart healthy    Complete by:  As directed      Increase activity slowly    Complete by:  As directed           Current Discharge Medication List    START taking these medications   Details  clindamycin (CLEOCIN) 300 MG capsule Take 2 capsules (600 mg total) by mouth 3 (three) times daily. Qty: 30 capsule, Refills: 0    oxyCODONE-acetaminophen (ROXICET) 5-325 MG tablet Take 1-2 tablets by mouth every 4 (four) hours as needed for severe pain. Qty: 30 tablet, Refills: 0      CONTINUE these medications which have CHANGED   Details  acetaminophen (TYLENOL) 500 MG tablet Take 2  tablets (1,000 mg total) by mouth every 4 (four) hours as needed for moderate pain. Qty: 30 tablet, Refills: 0      CONTINUE these medications which have NOT CHANGED   Details  ALPRAZolam (XANAX) 1 MG tablet Take 1 mg by mouth 4 (four) times daily as needed for anxiety.    Aspirin-Acetaminophen-Caffeine (GOODY HEADACHE PO) Take 1 packet by mouth daily as needed (for pain).    buPROPion  (WELLBUTRIN XL) 150 MG 24 hr tablet Take 150 mg by mouth daily.    diclofenac (VOLTAREN) 50 MG EC tablet Take 1 tablet (50 mg total) by mouth 2 (two) times daily. Qty: 10 tablet, Refills: 0    PARoxetine (PAXIL) 20 MG tablet Take 20 mg by mouth daily.    risperiDONE (RISPERDAL) 2 MG tablet Take 2 mg by mouth.       Allergies  Allergen Reactions  . Amoxicillin Anaphylaxis    swelling  . Bee Venom Anaphylaxis  . Penicillins Anaphylaxis    Has patient had a PCN reaction causing immediate rash, facial/tongue/throat swelling, SOB or lightheadedness with hypotension: Yesyes Has patient had a PCN reaction causing severe rash involving mucus membranes or skin necrosis: Nono Has patient had a PCN reaction that required hospitalization Nono Has patient had a PCN reaction occurring within the last 10 years: Nono If all of the above answers are "NO", then may proceed with Cephalosporin  . Naproxen     Made sick on stomach.    . Tramadol     Made sick to stomach      The results of significant diagnostics from this hospitalization (including imaging, microbiology, ancillary and laboratory) are listed below for reference.    Significant Diagnostic Studies: No results found.  Microbiology: No results found for this or any previous visit (from the past 240 hour(s)).   Labs: Basic Metabolic Panel:  Recent Labs Lab 03/30/15 1350 03/31/15 0718  NA 135 134*  K 3.5 3.7  CL 100* 103  CO2 25 24  GLUCOSE 121* 88  BUN 9 11  CREATININE 0.90 0.86  CALCIUM 9.3 8.8*   CBC:  Recent Labs Lab 03/28/15 2036 03/30/15 1350 03/31/15 0718  WBC 10.2 11.7* 7.2  NEUTROABS 7.9* 9.1*  --   HGB 13.6 13.6 12.5*  HCT 39.2 39.2 35.8*  MCV 94.2 94.7 94.2  PLT 324 301 280    Signed:  Erick BlinksMemon, Jehanzeb, MD  [facility] 04/01/2015, 10:39 AM    By signing my name below, I, [name], attest that this documentation has been prepared under the direction and in the presence of  Erick BlinksJehanzeb Memon, MD. Electronically signed: [name], Scribe. 04/01/2015 10:16am   I, Dr. Erick BlinksJehanzeb Memon, personally performed the services described in this documentaiton. All medical record entries made by the scribe were at my direction and in my presence. I have reviewed the chart and agree that the record reflects my personal performance and is accurate and complete  Erick BlinksJehanzeb Memon, MD, 04/01/2015 10:39 AM

## 2015-03-31 NOTE — Progress Notes (Signed)
Patient's pustule popped during the night.  Patient has had moderate amount of drainage.  Arm looks better and redness decreased.

## 2015-03-31 NOTE — Progress Notes (Signed)
[facility] PROGRESS NOTE  Theodore Crawford ZOX:[id number] DOB: [birth year] DOA: 03/30/2015 PCP: Augustine RadarOBERSON, KRISTINA, MD  Assessment/Plan: 1. Left arm cellulitis, improving. Nurse reports that overnight the pustule popped with moderate amount of drainage and improvement in redness and appearance. Will continue IV abx and plan to transition to oral tomorrow. HIV antibody in progress. Continue pain management, keep arm elevated, continue NSAIDs as anti-inflammatory effect and warm compresses. 2. Depression. Continue outpatient regimen 3. Anxiety. Continue outpatient regimen. 4. Benzodiazepine abuse.   Code Status: Full DVT prophylaxis: Lovenox Family Communication: Discussed with patient who understands and has no concerns at this time. Disposition Plan: Anticipate discharge within 24 hours.    Consultants:    Procedures:    Antibiotics:  Clindamycin 12/25   HPI/Subjective: Feeling well. Pustule popped overnight with relief in pressure.   Objective: Filed Vitals:   03/30/15 1637 03/31/15 0526  BP: 131/94 101/62  Pulse: 89 76  Temp: 99.2 F (37.3 C) 98.3 F (36.8 C)  Resp: 18 18   No intake or output data in the 24 hours ending 03/31/15 0754 Filed Weights   03/30/15 1236 03/30/15 1637  Weight: 81.647 kg (180 lb) 81.647 kg (180 lb)    Exam:  General: NAD, looks comfortable Cardiovascular: RRR, S1, S2  Respiratory: clear bilaterally, No wheezing, rales or rhonchi Abdomen: soft, non tender, no distention , bowel sounds normal Skin: Dime size wound noted in left AC and draining serosanguinous  discharge. Surrounding erythema is improving. Patient still has induration and tenderness to palpation.   Data Reviewed: Basic Metabolic Panel:  Recent Labs Lab 03/30/15 1350  NA 135  K 3.5  CL 100*  CO2 25  GLUCOSE 121*  BUN 9  CREATININE 0.90  CALCIUM 9.3  CBC:  Recent Labs Lab 03/28/15 2036 03/30/15 1350  WBC 10.2 11.7*  NEUTROABS 7.9* 9.1*  HGB  13.6 13.6  HCT 39.2 39.2  MCV 94.2 94.7  PLT 324 301     Studies: No results found.  Scheduled Meds: . buPROPion  150 mg Oral Daily  . clindamycin (CLEOCIN) IV  900 mg Intravenous Q8H  . diatrizoate meglumine-sodium      . enoxaparin (LOVENOX) injection  40 mg Subcutaneous Q24H  . ketorolac  30 mg Intravenous 4 times per day  . nicotine  21 mg Transdermal Daily  . PARoxetine  20 mg Oral Daily  . risperiDONE  2 mg Oral QHS   Continuous Infusions: . 0.9 % NaCl with KCl 40 mEq / L 100 mL/hr (03/31/15 0249)    Principal Problem:   Left arm cellulitis Active Problems:   Depression   Anxiety    Time spent: 25 minutes     Erick BlinksMemon, Jehanzeb, MD  [facility] Pager [phone]. If 7PM-7AM, please contact night-coverage at [email or web address], password [email or web address] 03/31/2015, 7:54 AM  LOS: 1 day     By signing my name below, I, Theodore Crawford, attest that this documentation has been prepared under the direction and in the presence of Erick BlinksJehanzeb Memon, MD. Electronically signed: Zadie CleverlyJessica Crawford, Scribe. 03/31/2015 10:00am   I, Dr. Erick BlinksJehanzeb Memon, personally performed the services described in this documentaiton. All medical record entries made by the scribe were at my direction and in my presence. I have reviewed the chart and agree that the record reflects my personal performance and is accurate and complete  Erick BlinksJehanzeb Memon, MD, 03/31/2015 10:10 AM

## 2015-04-01 DIAGNOSIS — M549 Dorsalgia, unspecified: Secondary | ICD-10-CM

## 2015-04-01 DIAGNOSIS — G8929 Other chronic pain: Secondary | ICD-10-CM | POA: Insufficient documentation

## 2015-04-01 MED ORDER — ACETAMINOPHEN 500 MG PO TABS: 1000.0000 mg | ORAL_TABLET | ORAL | Status: DC | PRN

## 2015-04-01 MED ORDER — OXYCODONE-ACETAMINOPHEN 5-325 MG PO TABS: 1.0000 | ORAL_TABLET | ORAL | Status: DC | PRN

## 2015-04-01 MED ORDER — CLINDAMYCIN HCL 300 MG PO CAPS: 600.0000 mg | ORAL_CAPSULE | Freq: Three times a day (TID) | ORAL | Status: DC

## 2015-04-01 NOTE — Care Management Note (Signed)
Case Management Note  Patient Details  Name: Marge DuncansRonald M Petta MRN: [id number] Date of Birth: Aug 05, 1975  Subjective/Objective:                  Pt is from home, ind with ADL's. Pt plans to return home with self care today.   Action/Plan: No CM needs.   Expected Discharge Date:   04/01/2015               Expected Discharge Plan:  Home/Self Care  In-House Referral:  NA  Discharge planning Services  CM Consult  Post Acute Care Choice:  NA Choice offered to:  NA  DME Arranged:    DME Agency:     HH Arranged:    HH Agency:     Status of Service:  Completed, signed off  Medicare Important Message Given:    Date Medicare IM Given:    Medicare IM give by:    Date Additional Medicare IM Given:    Additional Medicare Important Message give by:     If discussed at Long Length of Stay Meetings, dates discussed:    Additional Comments:  Malcolm MetroChildress, Thierry Dobosz Demske, RN 04/01/2015, 11:29 AM

## 2015-04-01 NOTE — Plan of Care (Signed)
Problem: Pain Managment: Goal: General experience of comfort will improve Outcome: Progressing Pt has required continuous pain medication. Rates pain as only coming down to a 6 after administration of pain medication.

## 2015-04-01 NOTE — Progress Notes (Signed)
Pt discharged via wheelchair into the care of his mother via private vehicle.  VS: WNL.  Discharge instructions reviewed and signed with pt.  No questions presented. Prescriptions x 2 provided to pt.  Sterile gauze applied to left arm wound.

## 2015-05-30 ENCOUNTER — Encounter: Payer: Self-pay | Admitting: Family Medicine

## 2015-05-30 ENCOUNTER — Ambulatory Visit (INDEPENDENT_AMBULATORY_CARE_PROVIDER_SITE_OTHER): Payer: Medicaid Other | Admitting: Family Medicine

## 2015-05-30 VITALS — BP 119/80 | HR 57 | Temp 97.3°F | Ht 72.0 in | Wt 182.4 lb

## 2015-05-30 DIAGNOSIS — M5126 Other intervertebral disc displacement, lumbar region: Secondary | ICD-10-CM | POA: Diagnosis not present

## 2015-05-30 DIAGNOSIS — F39 Unspecified mood [affective] disorder: Secondary | ICD-10-CM | POA: Diagnosis not present

## 2015-05-30 DIAGNOSIS — G8929 Other chronic pain: Secondary | ICD-10-CM

## 2015-05-30 DIAGNOSIS — M549 Dorsalgia, unspecified: Secondary | ICD-10-CM

## 2015-05-30 DIAGNOSIS — Z0000 Encounter for general adult medical examination without abnormal findings: Secondary | ICD-10-CM

## 2015-05-30 NOTE — Patient Instructions (Signed)
Great to meet you!  Lets plan to see you once a year unless you need [place] sooner.   Come back with any concerns  Please let us know if we can help you quit smoking.

## 2015-05-30 NOTE — Progress Notes (Signed)
   HPI  Patient presents today to establish care.  Patient has a history of drug abuse.  He is admitted to the hospital December after developing cellulitis of the arm after injecting cocaine.  Patient explains that he only smokes marijuana now. He has chronic back pain due to a bulging lumbar disc, he is on Suboxone currently. He feels very well with pain control.  He denies any shortness of breath, chest pain, abdominal pain, or any other concerns at this time.  He watches his fried and fatty foods but does not watch the amount of sugar sweetened beverages or foods that he eats. No formal exercise but he is becoming more active or in the house   PMH: Smoking status noted Past medical, surgical, social, family history reviewed and updated in EMR ROS: Per HPI  Objective: BP 119/80 mmHg  Pulse 57  Temp(Src) 97.3 F (36.3 C) (Oral)  Ht 6' (1.829 m)  Wt 182 lb 6.4 oz (82.736 kg)  BMI 24.73 kg/m2 Gen: NAD, alert, cooperative with exam HEENT: NCAT, TMs normal bilaterally, oropharynx clear CV: RRR, good S1/S2, no murmur Resp: CTABL, no wheezes, non-labored Abd: SNTND, BS present, no guarding or organomegaly Ext: No edema, warm Neuro: Alert and oriented, strength 5/5 and sensation intact in all 4 extremities  Assessment and plan:  # Chronic lumbar back pain Well-controlled on Suboxone, he is following with the Suboxone clinic. With previous polysubstance abuse he would not be a candidate for opiate prescribing in our practice  # Polysubstance abuse On Suboxone as above   # Annual exam Overall normal exam, labs He's had an HIV the hospital in December. He has mild normocytic anemia, repeating labs  Mood d/o, anxiety Treated by psych On adderall which he is stopping and xanax  Discussed diet and exercise  Orders Placed This Encounter  Procedures  . CBC with Differential  . CMP14+EGFR  . TSH  . Lipid Panel  . HIV antibody (with reflex)     Laroy Apple,  MD [facility] 05/30/2015, 1:58 PM

## 2015-05-31 LAB — LIPID PANEL
CHOLESTEROL TOTAL: 187 mg/dL (ref 100–199)
Chol/HDL Ratio: 3 ratio units (ref 0.0–5.0)
HDL: 62 mg/dL (ref >39)
LDL Calculated: 99 mg/dL (ref 0–99)
Triglycerides: 130 mg/dL (ref 0–149)
VLDL CHOLESTEROL CAL: 26 mg/dL (ref 5–40)

## 2015-05-31 LAB — CMP14+EGFR
ALK PHOS: 338 IU/L — AB (ref 39–117)
ALT: 323 IU/L — ABNORMAL HIGH (ref 0–44)
AST: 223 IU/L — AB (ref 0–40)
Albumin/Globulin Ratio: 1.7 (ref 1.1–2.5)
Albumin: 4.1 g/dL (ref 3.5–5.5)
BUN/Creatinine Ratio: 6 — ABNORMAL LOW (ref 8–19)
BUN: 5 mg/dL — AB (ref 6–20)
Bilirubin Total: 0.5 mg/dL (ref 0.0–1.2)
CO2: 25 mmol/L (ref 18–29)
CREATININE: 0.87 mg/dL (ref 0.76–1.27)
Calcium: 9.7 mg/dL (ref 8.7–10.2)
Chloride: 101 mmol/L (ref 96–106)
GFR calc Af Amer: 127 mL/min/{1.73_m2} (ref >59)
GFR calc non Af Amer: 110 mL/min/{1.73_m2} (ref >59)
GLUCOSE: 82 mg/dL (ref 65–99)
Globulin, Total: 2.4 g/dL (ref 1.5–4.5)
Potassium: 4.5 mmol/L (ref 3.5–5.2)
Sodium: 140 mmol/L (ref 134–144)
Total Protein: 6.5 g/dL (ref 6.0–8.5)

## 2015-05-31 LAB — CBC WITH DIFFERENTIAL/PLATELET
BASOS ABS: 0.1 10*3/uL (ref 0.0–0.2)
Basos: 2 %
EOS (ABSOLUTE): 0.2 10*3/uL (ref 0.0–0.4)
Eos: 3 %
Hematocrit: 40.2 % (ref 37.5–51.0)
Hemoglobin: 13.8 g/dL (ref 12.6–17.7)
Immature Grans (Abs): 0 10*3/uL (ref 0.0–0.1)
Immature Granulocytes: 0 %
LYMPHS ABS: 1.8 10*3/uL (ref 0.7–3.1)
Lymphs: 39 %
MCH: 32.6 pg (ref 26.6–33.0)
MCHC: 34.3 g/dL (ref 31.5–35.7)
MCV: 95 fL (ref 79–97)
MONOCYTES: 8 %
MONOS ABS: 0.4 10*3/uL (ref 0.1–0.9)
Neutrophils Absolute: 2.2 10*3/uL (ref 1.4–7.0)
Neutrophils: 48 %
PLATELETS: 360 10*3/uL (ref 150–379)
RBC: 4.23 x10E6/uL (ref 4.14–5.80)
RDW: 13.7 % (ref 12.3–15.4)
WBC: 4.6 10*3/uL (ref 3.4–10.8)

## 2015-05-31 LAB — TSH: TSH: 1.1 u[IU]/mL (ref 0.450–4.500)

## 2015-05-31 LAB — HIV ANTIBODY (ROUTINE TESTING W REFLEX): HIV Screen 4th Generation wRfx: NONREACTIVE

## 2015-06-12 ENCOUNTER — Encounter: Payer: Self-pay | Admitting: *Deleted

## 2015-08-02 ENCOUNTER — Encounter (HOSPITAL_COMMUNITY): Payer: Self-pay

## 2015-08-02 ENCOUNTER — Emergency Department (HOSPITAL_COMMUNITY)
Admission: EM | Admit: 2015-08-02 | Discharge: 2015-08-02 | Disposition: A | Discharge status: Home / Self Care | Payer: Medicaid Other | Attending: Emergency Medicine | Admitting: Emergency Medicine

## 2015-08-02 ENCOUNTER — Emergency Department (HOSPITAL_COMMUNITY): Payer: Medicaid Other

## 2015-08-02 DIAGNOSIS — F329 Major depressive disorder, single episode, unspecified: Secondary | ICD-10-CM | POA: Insufficient documentation

## 2015-08-02 DIAGNOSIS — K8020 Calculus of gallbladder without cholecystitis without obstruction: Secondary | ICD-10-CM

## 2015-08-02 DIAGNOSIS — I10 Essential (primary) hypertension: Secondary | ICD-10-CM | POA: Diagnosis not present

## 2015-08-02 DIAGNOSIS — R1084 Generalized abdominal pain: Secondary | ICD-10-CM | POA: Diagnosis not present

## 2015-08-02 DIAGNOSIS — F17210 Nicotine dependence, cigarettes, uncomplicated: Secondary | ICD-10-CM | POA: Insufficient documentation

## 2015-08-02 LAB — COMPREHENSIVE METABOLIC PANEL
ALK PHOS: 95 U/L (ref 38–126)
ALT: 135 U/L — AB (ref 17–63)
AST: 75 U/L — ABNORMAL HIGH (ref 15–41)
Albumin: 4.1 g/dL (ref 3.5–5.0)
Anion gap: 12 (ref 5–15)
BUN: 9 mg/dL (ref 6–20)
CALCIUM: 9.3 mg/dL (ref 8.9–10.3)
CHLORIDE: 104 mmol/L (ref 101–111)
CO2: 23 mmol/L (ref 22–32)
CREATININE: 1.09 mg/dL (ref 0.61–1.24)
GFR calc Af Amer: >60 mL/min (ref >60)
GFR calc non Af Amer: >60 mL/min (ref >60)
Glucose, Bld: 108 mg/dL — ABNORMAL HIGH (ref 65–99)
Potassium: 3 mmol/L — ABNORMAL LOW (ref 3.5–5.1)
SODIUM: 139 mmol/L (ref 135–145)
Total Bilirubin: 0.9 mg/dL (ref 0.3–1.2)
Total Protein: 7 g/dL (ref 6.5–8.1)

## 2015-08-02 LAB — I-STAT CG4 LACTIC ACID, ED: Lactic Acid, Venous: 2.66 mmol/L (ref 0.5–2.0)

## 2015-08-02 LAB — CBC WITH DIFFERENTIAL/PLATELET
Basophils Absolute: 0 10*3/uL (ref 0.0–0.1)
Basophils Relative: 0 %
EOS ABS: 0 10*3/uL (ref 0.0–0.7)
EOS PCT: 0 %
HCT: 43.6 % (ref 39.0–52.0)
Hemoglobin: 15.4 g/dL (ref 13.0–17.0)
LYMPHS ABS: 1.3 10*3/uL (ref 0.7–4.0)
LYMPHS PCT: 20 %
MCH: 32.2 pg (ref 26.0–34.0)
MCHC: 35.3 g/dL (ref 30.0–36.0)
MCV: 91 fL (ref 78.0–100.0)
MONO ABS: 0.3 10*3/uL (ref 0.1–1.0)
MONOS PCT: 4 %
Neutro Abs: 4.9 10*3/uL (ref 1.7–7.7)
Neutrophils Relative %: 76 %
PLATELETS: 346 10*3/uL (ref 150–400)
RBC: 4.79 MIL/uL (ref 4.22–5.81)
RDW: 13.1 % (ref 11.5–15.5)
WBC: 6.4 10*3/uL (ref 4.0–10.5)

## 2015-08-02 LAB — LIPASE, BLOOD: Lipase: 31 U/L (ref 11–51)

## 2015-08-02 MED ORDER — MORPHINE SULFATE (PF) 4 MG/ML IV SOLN
4.0000 mg | Freq: Once | INTRAVENOUS | Status: AC
  Administered 2015-08-02: 4 mg via INTRAVENOUS
  Filled 2015-08-02: qty 1

## 2015-08-02 MED ORDER — SODIUM CHLORIDE 0.9 % IV BOLUS (SEPSIS)
1000.0000 mL | Freq: Once | INTRAVENOUS | Status: AC
  Administered 2015-08-02: 1000 mL via INTRAVENOUS

## 2015-08-02 MED ORDER — IOPAMIDOL (ISOVUE-300) INJECTION 61%
100.0000 mL | Freq: Once | INTRAVENOUS | Status: AC | PRN
  Administered 2015-08-02: 100 mL via INTRAVENOUS

## 2015-08-02 MED ORDER — SODIUM CHLORIDE 0.9 % IV SOLN
INTRAVENOUS | Status: DC
  Administered 2015-08-02: 18:00:00 via INTRAVENOUS

## 2015-08-02 MED ORDER — FENTANYL CITRATE (PF) 100 MCG/2ML IJ SOLN
50.0000 ug | Freq: Once | INTRAMUSCULAR | Status: AC
  Administered 2015-08-02: 50 ug via INTRAVENOUS
  Filled 2015-08-02: qty 2

## 2015-08-02 MED ORDER — DICYCLOMINE HCL 20 MG PO TABS: 20.0000 mg | ORAL_TABLET | Freq: Two times a day (BID) | ORAL | Status: DC

## 2015-08-02 MED ORDER — ONDANSETRON HCL 4 MG/2ML IJ SOLN
4.0000 mg | Freq: Once | INTRAMUSCULAR | Status: AC
  Administered 2015-08-02: 4 mg via INTRAVENOUS
  Filled 2015-08-02: qty 2

## 2015-08-02 NOTE — ED Notes (Signed)
Pt reports abd pain and vomiting x 2 days.  Denies any diarrhea.  LBM was today.

## 2015-08-02 NOTE — Discharge Instructions (Signed)
As discussed, today's evaluation has been largely reassuring.  But, there are gallstones that may be contributing to your pain. This requires additional evaluation with our surgical colleagues.  Please be sure to make an appointment.  Return here for concerning changes in your condition.

## 2015-08-02 NOTE — ED Notes (Signed)
Pt states understanding of care given and follow up instructions.  AMbulated from ED

## 2015-08-02 NOTE — ED Notes (Signed)
EDP at bedside  

## 2015-08-02 NOTE — ED Provider Notes (Signed)
CSN: [id number]     Arrival date & time 08/02/15  1647 History   First MD Initiated Contact with Patient 08/02/15 1712     Chief Complaint  Patient presents with  . Abdominal Pain     (Consider location/radiation/quality/duration/timing/severity/associated sxs/prior Treatment) HPI Patient presents with 2 days of abdominal pain, nausea, vomiting. Symptoms began without clear precipitant. Since onset symptoms of been persistent, with diffuse sore abdominal tenderness. No ability to take medication for pain relief. Patient has multiple medical issues, including chronic back pain, anxiety. He denies history of abdominal surgery. No fever, chills. No chest pain, dyspnea. No recent sick contacts, travel, new medication or diet.  Past Medical History  Diagnosis Date  . Leg pain, right   . Hypertension   . Depression   . Bipolar 1 disorder (HCC)   . PTSD (post-traumatic stress disorder)   . Anxiety disorder   . Back pain   . IVDU (intravenous drug user)   . Allergy   . Anxiety   . Neuromuscular disorder [facility])    Past Surgical History  Procedure Laterality Date  . Chest tube insertion     Family History  Problem Relation Age of Onset  . COPD Mother   . Heart disease Mother   . Depression Father   . Hyperlipidemia Maternal Grandmother   . Hyperlipidemia Maternal Grandfather   . Hyperlipidemia Paternal Grandmother   . Hyperlipidemia Paternal Grandfather   . Cancer Paternal Grandfather     lung   Social History  Substance Use Topics  . Smoking status: Current Every Day Smoker -- 1.00 packs/day    Types: Cigarettes  . Smokeless tobacco: Never Used  . Alcohol Use: No     Comment: occ. use    Review of Systems  Constitutional:       Per HPI, otherwise negative  HENT:       Per HPI, otherwise negative  Respiratory:       Per HPI, otherwise negative  Cardiovascular:       Per HPI, otherwise negative  Gastrointestinal: Positive for nausea and vomiting.  Endocrine:        Negative aside from HPI  Genitourinary:       Neg aside from HPI   Musculoskeletal:       Per HPI, otherwise negative  Skin: Negative.   Neurological: Negative for syncope.      Allergies  Amoxicillin; Bee venom; Penicillins; Naproxen; and Tramadol  Home Medications   Prior to Admission medications   Medication Sig Start Date End Date Taking? Authorizing Provider  ALPRAZolam Prudy Feeler) 1 MG tablet Take 1 mg by mouth 4 (four) times daily as needed for anxiety.    Historical Provider, MD  PARoxetine (PAXIL) 20 MG tablet Take 20 mg by mouth daily.    Historical Provider, MD  risperiDONE (RISPERDAL) 2 MG tablet Take 2 mg by mouth.    Historical Provider, MD   BP 139/88 mmHg  Pulse 55  Temp(Src) 98.2 F (36.8 C) (Oral)  Resp 18  Ht 6' (1.829 m)  Wt 175 lb (79.379 kg)  BMI 23.73 kg/m2  SpO2 100% Physical Exam  Constitutional: He is oriented to person, place, and time. He appears well-developed. No distress.  HENT:  Head: Normocephalic and atraumatic.  Eyes: Conjunctivae and EOM are normal.  Cardiovascular: Normal rate and regular rhythm.   Pulmonary/Chest: Effort normal. No stridor. No respiratory distress.  Abdominal: He exhibits no distension. There is generalized tenderness. There is guarding.  Musculoskeletal: He exhibits  no edema.  Neurological: He is alert and oriented to person, place, and time.  Skin: Skin is warm and dry.  Psychiatric: His mood appears anxious.  Nursing note and vitals reviewed.   ED Course  Procedures (including critical care time) Labs Review Labs Reviewed  COMPREHENSIVE METABOLIC PANEL - Abnormal; Notable for the following:    Potassium 3.0 (*)    Glucose, Bld 108 (*)    AST 75 (*)    ALT 135 (*)    All other components within normal limits  I-STAT CG4 LACTIC ACID, ED - Abnormal; Notable for the following:    Lactic Acid, Venous 2.66 (*)    All other components within normal limits  LIPASE, BLOOD  CBC WITH DIFFERENTIAL/PLATELET   URINALYSIS, ROUTINE W REFLEX MICROSCOPIC (NOT AT [facility])    Imaging Review Ct Abdomen Pelvis W Contrast  08/02/2015  CLINICAL DATA:  Abdominal pain and vomiting for 2 days. EXAM: CT ABDOMEN AND PELVIS WITH CONTRAST TECHNIQUE: Multidetector CT imaging of the abdomen and pelvis was performed using the standard protocol following bolus administration of intravenous contrast. CONTRAST:  100mL ISOVUE-300 IOPAMIDOL (ISOVUE-300) INJECTION 61% COMPARISON:  None. FINDINGS: Lower chest: No acute findings. Stable partially calcified nodule at the right lung base is consistent with benign granuloma. Hepatobiliary: Small stone within the otherwise normal- appearing gallbladder. No evidence of cholecystitis. Liver appears normal. Pancreas: No mass, inflammatory changes, or other significant abnormality. Spleen: Within normal limits in size and appearance. Adrenals/Urinary Tract: Adrenal glands appear normal. Benign cyst exophytic to the posterior cortex of the left kidney measuring 1.6 cm. No renal stone or hydronephrosis bilaterally. No ureteral or bladder calculi identified. Bladder appears normal. Stomach/Bowel: Bowel is normal in caliber. No bowel wall thickening or evidence of bowel wall inflammation. Appendix appears grossly normal and there are no inflammatory changes adjacent to the cecum to suggest acute appendicitis. Stomach appears normal. Vascular/Lymphatic: No pathologically enlarged lymph nodes. No evidence of abdominal aortic aneurysm. Reproductive: No mass or other significant abnormality. Other: No free fluid or abscess collection seen. No free intraperitoneal air. Musculoskeletal: No acute or suspicious osseous lesion. Mild degenerative change in the lumbar spine. IMPRESSION: 1. Cholelithiasis without evidence of acute cholecystitis. 2. No evidence of acute intra-abdominal or intrapelvic abnormality. No free fluid or inflammatory change. No bowel obstruction. No renal or ureteral calculi. No appendicitis.  3. Additional chronic/incidental findings detailed above. Electronically Signed   By: Bary RichardStan  Maynard M.D.   On: 08/02/2015 20:08   I have personally reviewed and evaluated these images and lab results as part of my medical decision-making.   Chart review notable for multiple ED visits for back pain, some concern for prior narcotic seeking behavior.  8:32 PM On repeat exam the patient appears calm. No additional complaints. I informed him and his companion of all findings. Patient will follow up with surgery as an outpatient. MDM  She presents with new abdominal pain. Here, the patient is awake and alert, though initially quite uncomfortable appearing. Patient's evaluation suggests the presence of gallstones, though no evidence for acute cholecystitis, nor other acute abdominal processes. Patient's pain resolved, vital signs were stable, and the patient was appropriate for outpatient follow-up.   Gerhard Munchobert Latanya Hemmer, MD 08/02/15 2032

## 2015-08-02 NOTE — ED Notes (Signed)
Pt reminded a urine specimen is needed. Verbalized understanding and will notify staff when one can be obtained.

## 2015-08-02 NOTE — ED Notes (Signed)
Phlebotomy at bedside.

## 2015-08-02 NOTE — ED Notes (Signed)
Pt reports that fentanyl and morphine did not touch his pain. Informed that that I will let MD know, but to not expect more narcotic pain medication at this time due to him receiving medications less than 10 min ago. Dr. Jeraldine LootsLockwood notified.

## 2015-08-04 ENCOUNTER — Emergency Department (HOSPITAL_COMMUNITY)
Admission: EM | Admit: 2015-08-04 | Discharge: 2015-08-04 | Disposition: A | Discharge status: Home / Self Care | Payer: Medicaid Other | Attending: Emergency Medicine | Admitting: Emergency Medicine

## 2015-08-04 ENCOUNTER — Encounter (HOSPITAL_COMMUNITY): Payer: Self-pay | Admitting: Emergency Medicine

## 2015-08-04 DIAGNOSIS — R1010 Upper abdominal pain, unspecified: Secondary | ICD-10-CM | POA: Diagnosis present

## 2015-08-04 DIAGNOSIS — R740 Nonspecific elevation of levels of transaminase and lactic acid dehydrogenase [LDH]: Secondary | ICD-10-CM | POA: Diagnosis not present

## 2015-08-04 DIAGNOSIS — R109 Unspecified abdominal pain: Secondary | ICD-10-CM | POA: Insufficient documentation

## 2015-08-04 DIAGNOSIS — F17210 Nicotine dependence, cigarettes, uncomplicated: Secondary | ICD-10-CM | POA: Insufficient documentation

## 2015-08-04 DIAGNOSIS — F319 Bipolar disorder, unspecified: Secondary | ICD-10-CM | POA: Insufficient documentation

## 2015-08-04 DIAGNOSIS — I10 Essential (primary) hypertension: Secondary | ICD-10-CM | POA: Insufficient documentation

## 2015-08-04 DIAGNOSIS — Z79899 Other long term (current) drug therapy: Secondary | ICD-10-CM | POA: Diagnosis not present

## 2015-08-04 DIAGNOSIS — R7401 Elevation of levels of liver transaminase levels: Secondary | ICD-10-CM

## 2015-08-04 LAB — CBC WITH DIFFERENTIAL/PLATELET
BASOS PCT: 1 %
Basophils Absolute: 0 10*3/uL (ref 0.0–0.1)
EOS ABS: 0.2 10*3/uL (ref 0.0–0.7)
EOS PCT: 3 %
HCT: 41.6 % (ref 39.0–52.0)
Hemoglobin: 14.5 g/dL (ref 13.0–17.0)
LYMPHS ABS: 1.7 10*3/uL (ref 0.7–4.0)
Lymphocytes Relative: 28 %
MCH: 32.2 pg (ref 26.0–34.0)
MCHC: 34.9 g/dL (ref 30.0–36.0)
MCV: 92.2 fL (ref 78.0–100.0)
MONO ABS: 0.4 10*3/uL (ref 0.1–1.0)
MONOS PCT: 6 %
NEUTROS PCT: 62 %
Neutro Abs: 3.9 10*3/uL (ref 1.7–7.7)
PLATELETS: 321 10*3/uL (ref 150–400)
RBC: 4.51 MIL/uL (ref 4.22–5.81)
RDW: 13 % (ref 11.5–15.5)
WBC: 6.2 10*3/uL (ref 4.0–10.5)

## 2015-08-04 LAB — COMPREHENSIVE METABOLIC PANEL
ALBUMIN: 4 g/dL (ref 3.5–5.0)
ALK PHOS: 98 U/L (ref 38–126)
ALT: 107 U/L — AB (ref 17–63)
ANION GAP: 8 (ref 5–15)
AST: 60 U/L — ABNORMAL HIGH (ref 15–41)
BILIRUBIN TOTAL: 0.7 mg/dL (ref 0.3–1.2)
BUN: 6 mg/dL (ref 6–20)
CALCIUM: 9.3 mg/dL (ref 8.9–10.3)
CO2: 27 mmol/L (ref 22–32)
CREATININE: 0.93 mg/dL (ref 0.61–1.24)
Chloride: 106 mmol/L (ref 101–111)
GFR calc Af Amer: >60 mL/min (ref >60)
GFR calc non Af Amer: >60 mL/min (ref >60)
GLUCOSE: 108 mg/dL — AB (ref 65–99)
Potassium: 3.1 mmol/L — ABNORMAL LOW (ref 3.5–5.1)
SODIUM: 141 mmol/L (ref 135–145)
TOTAL PROTEIN: 6.7 g/dL (ref 6.5–8.1)

## 2015-08-04 LAB — URINALYSIS, ROUTINE W REFLEX MICROSCOPIC
BILIRUBIN URINE: NEGATIVE
Glucose, UA: NEGATIVE mg/dL
HGB URINE DIPSTICK: NEGATIVE
KETONES UR: NEGATIVE mg/dL
Leukocytes, UA: NEGATIVE
NITRITE: NEGATIVE
Protein, ur: NEGATIVE mg/dL
SPECIFIC GRAVITY, URINE: 1.01 (ref 1.005–1.030)
pH: 6 (ref 5.0–8.0)

## 2015-08-04 LAB — I-STAT CG4 LACTIC ACID, ED: LACTIC ACID, VENOUS: 1.37 mmol/L (ref 0.5–2.0)

## 2015-08-04 LAB — LIPASE, BLOOD: Lipase: 46 U/L (ref 11–51)

## 2015-08-04 MED ORDER — ONDANSETRON HCL 4 MG/2ML IJ SOLN
4.0000 mg | Freq: Once | INTRAMUSCULAR | Status: AC
Start: 2015-08-04 — End: 2015-08-04
  Administered 2015-08-04: 4 mg via INTRAVENOUS
  Filled 2015-08-04: qty 2

## 2015-08-04 MED ORDER — ONDANSETRON 4 MG PO TBDP
4.0000 mg | ORAL_TABLET | Freq: Three times a day (TID) | ORAL | Status: DC | PRN
Start: 2015-08-04 — End: 2015-08-26

## 2015-08-04 MED ORDER — HYDROMORPHONE HCL 1 MG/ML IJ SOLN
1.0000 mg | Freq: Once | INTRAMUSCULAR | Status: AC
  Administered 2015-08-04: 1 mg via INTRAVENOUS
  Filled 2015-08-04: qty 1

## 2015-08-04 MED ORDER — SODIUM CHLORIDE 0.9 % IV BOLUS (SEPSIS)
1000.0000 mL | Freq: Once | INTRAVENOUS | Status: AC
  Administered 2015-08-04: 1000 mL via INTRAVENOUS

## 2015-08-04 NOTE — ED Notes (Signed)
Patient c/o upper abd pain. Per patient started Wednesday and was seen here in ER on Friday. Patient diagnosed with gallstones and is to [facility]. Patient given prescription for Bentol. Per patient no relief in pain or vomiting. Denies any diarrhea.

## 2015-08-04 NOTE — ED Provider Notes (Signed)
CSN: [id number]     Arrival date & time 08/04/15  0719 History  By signing my name below, I, Linus Galas, attest that this documentation has been prepared under the direction and in the presence of Eber Hong, MD. Electronically Signed: Linus Galas, ED Scribe. 08/04/2015. 8:19 AM.  Chief Complaint  Patient presents with  . Abdominal Pain   The history is provided by the patient. No language interpreter was used.   HPI Comments: Theodore Crawford is a 40 y.o. male who presents to the Emergency Department with a PMHx of HTN complaining of upper abdominal pain that began 4 days ago that worsened last night. Pt was seen in the ED 2 days ago and dx with gallstones. Last night, several hours after eating dinner, he began having sharp stabbing pain. This morning, pt has had several episodes of vomiting since 6 AM, 2 hours ago. Pt uses BC Goody Powder (30 packets/week) with no relief. Pt denies any fevers, chills, diarrhea, CP, SOB, dysuria, stool changes or any other symptoms at this time.   Pt is an Personnel officer  Past Medical History  Diagnosis Date  . Leg pain, right   . Hypertension   . Depression   . Bipolar 1 disorder (HCC)   . PTSD (post-traumatic stress disorder)   . Anxiety disorder   . Back pain   . IVDU (intravenous drug user)   . Allergy   . Anxiety   . Neuromuscular disorder [facility])    Past Surgical History  Procedure Laterality Date  . Chest tube insertion     Family History  Problem Relation Age of Onset  . COPD Mother   . Heart disease Mother   . Depression Father   . Hyperlipidemia Maternal Grandmother   . Hyperlipidemia Maternal Grandfather   . Hyperlipidemia Paternal Grandmother   . Hyperlipidemia Paternal Grandfather   . Cancer Paternal Grandfather     lung   Social History  Substance Use Topics  . Smoking status: Current Every Day Smoker -- 1.00 packs/day    Types: Cigarettes  . Smokeless tobacco: Never Used  . Alcohol Use: No     Comment: occ. use     Review of Systems  Constitutional: Negative for fever and chills.  Respiratory: Negative for shortness of breath.   Cardiovascular: Negative for chest pain.  Gastrointestinal: Positive for vomiting and abdominal pain. Negative for diarrhea.  Genitourinary: Negative for dysuria.  All other systems reviewed and are negative.   Allergies  Amoxicillin; Bee venom; Penicillins; Naproxen; and Tramadol  Home Medications   Prior to Admission medications   Medication Sig Start Date End Date Taking? Authorizing Provider  ALPRAZolam Prudy Feeler) 1 MG tablet Take 1 mg by mouth 4 (four) times daily as needed for anxiety.   Yes Historical Provider, MD  dicyclomine (BENTYL) 20 MG tablet Take 1 tablet (20 mg total) by mouth 2 (two) times daily. 08/02/15  Yes Gerhard Munch, MD  PARoxetine (PAXIL) 20 MG tablet Take 20 mg by mouth daily.   Yes Historical Provider, MD  risperiDONE (RISPERDAL) 2 MG tablet Take 2 mg by mouth at bedtime.    Yes Historical Provider, MD  ondansetron (ZOFRAN ODT) 4 MG disintegrating tablet Take 1 tablet (4 mg total) by mouth every 8 (eight) hours as needed for nausea. 08/04/15   Eber Hong, MD   BP 135/103 mmHg  Pulse 62  Temp(Src) 97.9 F (36.6 C) (Oral)  Resp 18  Ht 6' (1.829 m)  Wt 175 lb (79.379 kg)  BMI 23.73 kg/m2  SpO2 99%   Physical Exam  Constitutional: He appears well-developed and well-nourished. No distress.  HENT:  Head: Normocephalic and atraumatic.  Mouth/Throat: Oropharynx is clear and moist. No oropharyngeal exudate.  Eyes: Conjunctivae and EOM are normal. Pupils are equal, round, and reactive to light. Right eye exhibits no discharge. Left eye exhibits no discharge. No scleral icterus.  Neck: Normal range of motion. Neck supple. No JVD present. No thyromegaly present.  Cardiovascular: Normal rate, regular rhythm, normal heart sounds and intact distal pulses.  Exam reveals no gallop and no friction rub.   No murmur heard. Pulmonary/Chest: Effort  normal and breath sounds normal. No respiratory distress. He has no wheezes. He has no rales.  Abdominal: Soft. Bowel sounds are normal. He exhibits no distension and no mass. There is tenderness. There is no CVA tenderness.  epigastric and moderate RUQ tenderness, positive Murphy's sign, no other abdominal tenderness.  Musculoskeletal: Normal range of motion. He exhibits no edema or tenderness.  Lymphadenopathy:    He has no cervical adenopathy.  Neurological: He is alert. Coordination normal.  Skin: Skin is warm and dry. No rash noted. No erythema.  Psychiatric: He has a normal mood and affect. His behavior is normal.  Nursing note and vitals reviewed.   ED Course  Procedures  DIAGNOSTIC STUDIES: Oxygen Saturation is 99% on room air, normal by my interpretation.    COORDINATION OF CARE: 8:12 AM Will order blood work and urinalysis. Discussed treatment plan with pt and mother at bedside and they agreed to plan.  Labs Review Labs Reviewed  COMPREHENSIVE METABOLIC PANEL - Abnormal; Notable for the following:    Potassium 3.1 (*)    Glucose, Bld 108 (*)    AST 60 (*)    ALT 107 (*)    All other components within normal limits  LIPASE, BLOOD  CBC WITH DIFFERENTIAL/PLATELET  URINALYSIS, ROUTINE W REFLEX MICROSCOPIC (NOT AT [facility])  I-STAT CG4 LACTIC ACID, ED    Imaging Review Ct Abdomen Pelvis W Contrast  08/02/2015  CLINICAL DATA:  Abdominal pain and vomiting for 2 days. EXAM: CT ABDOMEN AND PELVIS WITH CONTRAST TECHNIQUE: Multidetector CT imaging of the abdomen and pelvis was performed using the standard protocol following bolus administration of intravenous contrast. CONTRAST:  100mL ISOVUE-300 IOPAMIDOL (ISOVUE-300) INJECTION 61% COMPARISON:  None. FINDINGS: Lower chest: No acute findings. Stable partially calcified nodule at the right lung base is consistent with benign granuloma. Hepatobiliary: Small stone within the otherwise normal- appearing gallbladder. No evidence of  cholecystitis. Liver appears normal. Pancreas: No mass, inflammatory changes, or other significant abnormality. Spleen: Within normal limits in size and appearance. Adrenals/Urinary Tract: Adrenal glands appear normal. Benign cyst exophytic to the posterior cortex of the left kidney measuring 1.6 cm. No renal stone or hydronephrosis bilaterally. No ureteral or bladder calculi identified. Bladder appears normal. Stomach/Bowel: Bowel is normal in caliber. No bowel wall thickening or evidence of bowel wall inflammation. Appendix appears grossly normal and there are no inflammatory changes adjacent to the cecum to suggest acute appendicitis. Stomach appears normal. Vascular/Lymphatic: No pathologically enlarged lymph nodes. No evidence of abdominal aortic aneurysm. Reproductive: No mass or other significant abnormality. Other: No free fluid or abscess collection seen. No free intraperitoneal air. Musculoskeletal: No acute or suspicious osseous lesion. Mild degenerative change in the lumbar spine. IMPRESSION: 1. Cholelithiasis without evidence of acute cholecystitis. 2. No evidence of acute intra-abdominal or intrapelvic abnormality. No free fluid or inflammatory change. No bowel obstruction. No renal or  ureteral calculi. No appendicitis. 3. Additional chronic/incidental findings detailed above. Electronically Signed   By: Bary Richard M.D.   On: 08/02/2015 20:08   I have personally reviewed and evaluated these images and lab results as part of my medical decision-making.    MDM   Final diagnoses:  Abdominal pain, unspecified abdominal location  Transaminitis    Labs are improving Pain is consistent with potential biliary colic Have come back in the AM for a formal ultrasound Refer to general surgery otherwise. Pt in agreement with plan and tolerated PO crackers and juice prior to leaving - states he wants to go get a big breakfast - cautioned to stay on soft / liquid diet today.  I personally  performed the services described in this documentation, which was scribed in my presence. The recorded information has been reviewed and is accurate.       Eber Hong, MD 08/04/15 1018

## 2015-08-04 NOTE — Discharge Instructions (Signed)
Your testing showed that you have improving liver function over time but you may still have a gall stone that is causing problems  Today you are to drink plenty of fluids and eat only soft foods like applesauce / chicken broth  Come back in the morning for an ultrsound to the ER - you do not have to check in as a patient.  You should come back immediately if your pain worsens.  Zofran for nausea

## 2015-08-04 NOTE — ED Notes (Addendum)
Pt made aware to return if symptoms worsen or if any life threatening symptoms occur.  Pt also made aware of US appt tomorrow at 1515 and verbalized understanding to not eat after 9am tomorrow.

## 2015-08-05 ENCOUNTER — Other Ambulatory Visit (HOSPITAL_COMMUNITY): Payer: Self-pay | Admitting: Emergency Medicine

## 2015-08-05 ENCOUNTER — Ambulatory Visit (HOSPITAL_COMMUNITY)
Admission: RE | Admit: 2015-08-05 | Discharge: 2015-08-05 | Disposition: A | Discharge status: Home / Self Care | Payer: Medicaid Other | Source: Ambulatory Visit | Attending: Emergency Medicine | Admitting: Emergency Medicine

## 2015-08-05 DIAGNOSIS — R1011 Right upper quadrant pain: Secondary | ICD-10-CM | POA: Insufficient documentation

## 2015-08-05 DIAGNOSIS — K8020 Calculus of gallbladder without cholecystitis without obstruction: Secondary | ICD-10-CM | POA: Insufficient documentation

## 2015-08-05 DIAGNOSIS — N281 Cyst of kidney, acquired: Secondary | ICD-10-CM | POA: Insufficient documentation

## 2015-08-05 DIAGNOSIS — K760 Fatty (change of) liver, not elsewhere classified: Secondary | ICD-10-CM | POA: Insufficient documentation

## 2015-08-05 NOTE — ED Provider Notes (Signed)
Met with patient and discussed [place] results.  I recommend surgical follow up with Dr. Arnoldo Morale.  Pt also advised to NOT take NSAIDS, and to take Prilosec BID.    Pt states "I'm just going to buy some mother fucking pain medicine on the street.  You mother fuckers ain't doiing shit to help my ass".  Pt left with his mother.  Tanna Furry, MD 08/05/15 [phone]

## 2015-08-06 ENCOUNTER — Telehealth: Payer: Self-pay

## 2015-08-06 DIAGNOSIS — K8020 Calculus of gallbladder without cholecystitis without obstruction: Secondary | ICD-10-CM | POA: Insufficient documentation

## 2015-08-06 NOTE — Telephone Encounter (Signed)
Referring to general surgery, Dr. Lovell SheehanJenkins, by request.  Patient has documented gallstones from that ultrasound in the ER.  He does not have acute cholecystitis, he left AGAINST MEDICAL ADVICE in the ER.  Murtis SinkSam Tylek Boney, MD [facility] 08/06/2015, 10:32 AM

## 2015-08-06 NOTE — Telephone Encounter (Signed)
MOther calling stating son had US which showed gallstone and that he needed to be referred to Dr Franky MachoMark Jenkins surgeon

## 2015-08-26 ENCOUNTER — Emergency Department (HOSPITAL_COMMUNITY)
Admission: EM | Admit: 2015-08-26 | Discharge: 2015-08-26 | Disposition: A | Discharge status: Home / Self Care | Payer: Medicaid Other | Attending: Emergency Medicine | Admitting: Emergency Medicine

## 2015-08-26 ENCOUNTER — Encounter (HOSPITAL_COMMUNITY): Payer: Self-pay | Admitting: *Deleted

## 2015-08-26 DIAGNOSIS — K8050 Calculus of bile duct without cholangitis or cholecystitis without obstruction: Secondary | ICD-10-CM | POA: Insufficient documentation

## 2015-08-26 DIAGNOSIS — R7401 Elevation of levels of liver transaminase levels: Secondary | ICD-10-CM

## 2015-08-26 DIAGNOSIS — F17210 Nicotine dependence, cigarettes, uncomplicated: Secondary | ICD-10-CM | POA: Diagnosis not present

## 2015-08-26 DIAGNOSIS — F329 Major depressive disorder, single episode, unspecified: Secondary | ICD-10-CM | POA: Diagnosis not present

## 2015-08-26 DIAGNOSIS — I10 Essential (primary) hypertension: Secondary | ICD-10-CM | POA: Insufficient documentation

## 2015-08-26 DIAGNOSIS — R740 Nonspecific elevation of levels of transaminase and lactic acid dehydrogenase [LDH]: Secondary | ICD-10-CM | POA: Insufficient documentation

## 2015-08-26 DIAGNOSIS — R748 Abnormal levels of other serum enzymes: Secondary | ICD-10-CM | POA: Insufficient documentation

## 2015-08-26 DIAGNOSIS — Z79899 Other long term (current) drug therapy: Secondary | ICD-10-CM | POA: Diagnosis not present

## 2015-08-26 DIAGNOSIS — R1031 Right lower quadrant pain: Secondary | ICD-10-CM | POA: Diagnosis present

## 2015-08-26 LAB — COMPREHENSIVE METABOLIC PANEL
ALK PHOS: 95 U/L (ref 38–126)
ALT: 109 U/L — AB (ref 17–63)
AST: 75 U/L — ABNORMAL HIGH (ref 15–41)
Albumin: 4 g/dL (ref 3.5–5.0)
Anion gap: 7 (ref 5–15)
BILIRUBIN TOTAL: 0.5 mg/dL (ref 0.3–1.2)
BUN: 10 mg/dL (ref 6–20)
CO2: 28 mmol/L (ref 22–32)
CREATININE: 0.96 mg/dL (ref 0.61–1.24)
Calcium: 9.2 mg/dL (ref 8.9–10.3)
Chloride: 101 mmol/L (ref 101–111)
GFR calc non Af Amer: >60 mL/min (ref >60)
GLUCOSE: 125 mg/dL — AB (ref 65–99)
Potassium: 3.3 mmol/L — ABNORMAL LOW (ref 3.5–5.1)
SODIUM: 136 mmol/L (ref 135–145)
Total Protein: 7 g/dL (ref 6.5–8.1)

## 2015-08-26 LAB — CBC WITH DIFFERENTIAL/PLATELET
BASOS ABS: 0.1 10*3/uL (ref 0.0–0.1)
Basophils Relative: 1 %
EOS PCT: 3 %
Eosinophils Absolute: 0.2 10*3/uL (ref 0.0–0.7)
HEMATOCRIT: 43.9 % (ref 39.0–52.0)
Hemoglobin: 15.5 g/dL (ref 13.0–17.0)
LYMPHS ABS: 2.8 10*3/uL (ref 0.7–4.0)
LYMPHS PCT: 45 %
MCH: 32.8 pg (ref 26.0–34.0)
MCHC: 35.3 g/dL (ref 30.0–36.0)
MCV: 92.8 fL (ref 78.0–100.0)
MONO ABS: 0.4 10*3/uL (ref 0.1–1.0)
Monocytes Relative: 7 %
NEUTROS ABS: 2.8 10*3/uL (ref 1.7–7.7)
Neutrophils Relative %: 44 %
PLATELETS: 335 10*3/uL (ref 150–400)
RBC: 4.73 MIL/uL (ref 4.22–5.81)
RDW: 12.9 % (ref 11.5–15.5)
WBC: 6.3 10*3/uL (ref 4.0–10.5)

## 2015-08-26 LAB — LIPASE, BLOOD: Lipase: 56 U/L — ABNORMAL HIGH (ref 11–51)

## 2015-08-26 MED ORDER — ONDANSETRON HCL 4 MG/2ML IJ SOLN
4.0000 mg | Freq: Once | INTRAMUSCULAR | Status: AC
  Administered 2015-08-26: 4 mg via INTRAVENOUS
  Filled 2015-08-26: qty 2

## 2015-08-26 MED ORDER — HYDROMORPHONE HCL 1 MG/ML IJ SOLN
0.5000 mg | Freq: Once | INTRAMUSCULAR | Status: AC
  Administered 2015-08-26: 0.5 mg via INTRAVENOUS
  Filled 2015-08-26: qty 1

## 2015-08-26 MED ORDER — HYDROMORPHONE HCL 1 MG/ML IJ SOLN
1.0000 mg | Freq: Once | INTRAMUSCULAR | Status: AC
  Administered 2015-08-26: 1 mg via INTRAVENOUS
  Filled 2015-08-26: qty 1

## 2015-08-26 MED ORDER — SODIUM CHLORIDE 0.9 % IV SOLN
1000.0000 mL | Freq: Once | INTRAVENOUS | Status: AC
  Administered 2015-08-26: 1000 mL via INTRAVENOUS

## 2015-08-26 MED ORDER — ONDANSETRON HCL 4 MG PO TABS: 4.0000 mg | ORAL_TABLET | Freq: Four times a day (QID) | ORAL | Status: DC | PRN

## 2015-08-26 MED ORDER — OXYCODONE-ACETAMINOPHEN 5-325 MG PO TABS: 1.0000 | ORAL_TABLET | ORAL | Status: DC | PRN

## 2015-08-26 MED ORDER — SODIUM CHLORIDE 0.9 % IV SOLN
1000.0000 mL | INTRAVENOUS | Status: DC
  Administered 2015-08-26: 1000 mL via INTRAVENOUS

## 2015-08-26 NOTE — Discharge Instructions (Signed)
Your pain today was from your gallstone. To minimize the risk of having pain like this, you need to stay on a low-fat diet.  Please call the surgeon to have your appointment rescheduled to tomorrow. At that time, they can set you up for your gallbladder surgery.  Biliary Colic Biliary colic is a pain in the upper abdomen. The pain:  Is usually felt on the right side of the abdomen, but it may also be felt in the center of the abdomen, just below the breastbone (sternum).  May spread back toward the right shoulder blade.  May be steady or irregular.  May be accompanied by nausea and vomiting. Most of the time, the pain goes away in 1-5 hours. After the most intense pain passes, the abdomen may continue to ache mildly for about 24 hours. Biliary colic is caused by a blockage in the bile duct. The bile duct is a pathway that carries bile--a liquid that helps to digest fats--from the gallbladder to the small intestine. Biliary colic usually occurs after eating, when the digestive system demands bile. The pain develops when muscle cells contract forcefully to try to move the blockage so that bile can get by. HOME CARE INSTRUCTIONS  Take medicines only as directed by your health care provider.  Drink enough fluid to keep your urine clear or pale yellow.  Avoid fatty, greasy, and fried foods. These kinds of foods increase your body's demand for bile.  Avoid any foods that make your pain worse.  Avoid overeating.  Avoid having a large meal after fasting. SEEK MEDICAL CARE IF:  You develop a fever.  Your pain gets worse.  You vomit.  You develop nausea that prevents you from eating and drinking. SEEK IMMEDIATE MEDICAL CARE IF:  You suddenly develop a fever and shaking chills.  You develop a yellowish discoloration (jaundice) of:  Skin.  Whites of the eyes.  Mucous membranes.  You have continuous or severe pain that is not relieved with medicines.  You have nausea and  vomiting that is not relieved with medicines.  You develop dizziness or you faint.   This information is not intended to replace advice given to you by your health care provider. Make sure you discuss any questions you have with your health care provider.   Document Released: 08/24/2005 Document Revised: 08/07/2014 Document Reviewed: 01/02/2014 Elsevier Interactive Patient Education 2016 [employer]  Acetaminophen; Oxycodone tablets What is this medicine? ACETAMINOPHEN; OXYCODONE (a set a MEE noe fen; ox i KOE done) is a pain reliever. It is used to treat moderate to severe pain. This medicine may be used for other purposes; ask your health care provider or pharmacist if you have questions. What should I tell my health care provider before I take this medicine? They need to know if you have any of these conditions: -brain tumor -Crohn's disease, inflammatory bowel disease, or ulcerative colitis -drug abuse or addiction -head injury -heart or circulation problems -if you often drink alcohol -kidney disease or problems going to the bathroom -liver disease -lung disease, asthma, or breathing problems -an unusual or allergic reaction to acetaminophen, oxycodone, other opioid analgesics, other medicines, foods, dyes, or preservatives -pregnant or trying to get pregnant -breast-feeding How should I use this medicine? Take this medicine by mouth with a full glass of water. Follow the directions on the prescription label. You can take it with or without food. If it upsets your stomach, take it with food. Take your medicine at regular intervals. Do not  take it more often than directed. Talk to your pediatrician regarding the use of this medicine in children. Special care may be needed. Patients over 7 years old may have a stronger reaction and need a smaller dose. Overdosage: If you think you have taken too much of this medicine contact a poison control center or emergency room at  once. NOTE: This medicine is only for you. Do not share this medicine with others. What if I miss a dose? If you miss a dose, take it as soon as you can. If it is almost time for your next dose, take only that dose. Do not take double or extra doses. What may interact with this medicine? -alcohol -antihistamines -barbiturates like amobarbital, butalbital, butabarbital, methohexital, pentobarbital, phenobarbital, thiopental, and secobarbital -benztropine -drugs for bladder problems like solifenacin, trospium, oxybutynin, tolterodine, hyoscyamine, and methscopolamine -drugs for breathing problems like ipratropium and tiotropium -drugs for certain stomach or intestine problems like propantheline, homatropine methylbromide, glycopyrrolate, atropine, belladonna, and dicyclomine -general anesthetics like etomidate, ketamine, nitrous oxide, propofol, desflurane, enflurane, halothane, isoflurane, and sevoflurane -medicines for depression, anxiety, or psychotic disturbances -medicines for sleep -muscle relaxants -naltrexone -narcotic medicines (opiates) for pain -phenothiazines like perphenazine, thioridazine, chlorpromazine, mesoridazine, fluphenazine, prochlorperazine, promazine, and trifluoperazine -scopolamine -tramadol -trihexyphenidyl This list may not describe all possible interactions. Give your health care provider a list of all the medicines, herbs, non-prescription drugs, or dietary supplements you use. Also tell them if you smoke, drink alcohol, or use illegal drugs. Some items may interact with your medicine. What should I watch for while using this medicine? Tell your doctor or health care professional if your pain does not go away, if it gets worse, or if you have new or a different type of pain. You may develop tolerance to the medicine. Tolerance means that you will need a higher dose of the medication for pain relief. Tolerance is normal and is expected if you take this medicine for  a long time. Do not suddenly stop taking your medicine because you may develop a severe reaction. Your body becomes used to the medicine. This does NOT mean you are addicted. Addiction is a behavior related to getting and using a drug for a non-medical reason. If you have pain, you have a medical reason to take pain medicine. Your doctor will tell you how much medicine to take. If your doctor wants you to stop the medicine, the dose will be slowly lowered over time to avoid any side effects. You may get drowsy or dizzy. Do not drive, use machinery, or do anything that needs mental alertness until you know how this medicine affects you. Do not stand or sit up quickly, especially if you are an older patient. This reduces the risk of dizzy or fainting spells. Alcohol may interfere with the effect of this medicine. Avoid alcoholic drinks. There are different types of narcotic medicines (opiates) for pain. If you take more than one type at the same time, you may have more side effects. Give your health care provider a list of all medicines you use. Your doctor will tell you how much medicine to take. Do not take more medicine than directed. Call emergency for help if you have problems breathing. The medicine will cause constipation. Try to have a bowel movement at least every 2 to 3 days. If you do not have a bowel movement for 3 days, call your doctor or health care professional. Do not take Tylenol (acetaminophen) or medicines that have acetaminophen with this medicine.  Too much acetaminophen can be very dangerous. Many nonprescription medicines contain acetaminophen. Always read the labels carefully to avoid taking more acetaminophen. What side effects may I notice from receiving this medicine? Side effects that you should report to your doctor or health care professional as soon as possible: -allergic reactions like skin rash, itching or hives, swelling of the face, lips, or tongue -breathing difficulties,  wheezing -confusion -light headedness or fainting spells -severe stomach pain -unusually weak or tired -yellowing of the skin or the whites of the eyes Side effects that usually do not require medical attention (report to your doctor or health care professional if they continue or are bothersome): -dizziness -drowsiness -nausea -vomiting This list may not describe all possible side effects. Call your doctor for medical advice about side effects. You may report side effects to FDA at 1-800-FDA-1088. Where should I keep my medicine? Keep out of the reach of children. This medicine can be abused. Keep your medicine in a safe place to protect it from theft. Do not share this medicine with anyone. Selling or giving away this medicine is dangerous and against the law. This medicine may cause accidental overdose and death if it taken by other adults, children, or pets. Mix any unused medicine with a substance like cat litter or coffee grounds. Then throw the medicine away in a sealed container like a sealed bag or a coffee can with a lid. Do not use the medicine after the expiration date. Store at room temperature between 20 and 25 degrees C (68 and 77 degrees F). NOTE: This sheet is a summary. It may not cover all possible information. If you have questions about this medicine, talk to your doctor, pharmacist, or health care provider.    2016, Elsevier/Gold Standard. (2014-02-21 15:18:46)  Ondansetron tablets What is this medicine? ONDANSETRON (on DAN se tron) is used to treat nausea and vomiting caused by chemotherapy. It is also used to prevent or treat nausea and vomiting after surgery. This medicine may be used for other purposes; ask your health care provider or pharmacist if you have questions. What should I tell my health care provider before I take this medicine? They need to know if you have any of these conditions: -heart disease -history of irregular heartbeat -liver disease -low  levels of magnesium or potassium in the blood -an unusual or allergic reaction to ondansetron, granisetron, other medicines, foods, dyes, or preservatives -pregnant or trying to get pregnant -breast-feeding How should I use this medicine? Take this medicine by mouth with a glass of water. Follow the directions on your prescription label. Take your doses at regular intervals. Do not take your medicine more often than directed. Talk to your pediatrician regarding the use of this medicine in children. Special care may be needed. Overdosage: If you think you have taken too much of this medicine contact a poison control center or emergency room at once. NOTE: This medicine is only for you. Do not share this medicine with others. What if I miss a dose? If you miss a dose, take it as soon as you can. If it is almost time for your next dose, take only that dose. Do not take double or extra doses. What may interact with this medicine? Do not take this medicine with any of the following medications: -apomorphine -certain medicines for fungal infections like fluconazole, itraconazole, ketoconazole, posaconazole, voriconazole -cisapride -dofetilide -dronedarone -pimozide -thioridazine -ziprasidone This medicine may also interact with the following medications: -carbamazepine -certain medicines for depression,  anxiety, or psychotic disturbances -fentanyl -linezolid -MAOIs like Carbex, Eldepryl, Marplan, Nardil, and Parnate -methylene blue (injected into a vein) -other medicines that prolong the QT interval (cause an abnormal heart rhythm) -phenytoin -rifampicin -tramadol This list may not describe all possible interactions. Give your health care provider a list of all the medicines, herbs, non-prescription drugs, or dietary supplements you use. Also tell them if you smoke, drink alcohol, or use illegal drugs. Some items may interact with your medicine. What should I watch for while using this  medicine? Check with your doctor or health care professional right away if you have any sign of an allergic reaction. What side effects may I notice from receiving this medicine? Side effects that you should report to your doctor or health care professional as soon as possible: -allergic reactions like skin rash, itching or hives, swelling of the face, lips or tongue -breathing problems -confusion -dizziness -fast or irregular heartbeat -feeling faint or lightheaded, falls -fever and chills -loss of balance or coordination -seizures -sweating -swelling of the hands or feet -tightness in the chest -tremors -unusually weak or tired Side effects that usually do not require medical attention (report to your doctor or health care professional if they continue or are bothersome): -constipation or diarrhea -headache This list may not describe all possible side effects. Call your doctor for medical advice about side effects. You may report side effects to FDA at 1-800-FDA-1088. Where should I keep my medicine? Keep out of the reach of children. Store between 2 and 30 degrees C (36 and 86 degrees F). Throw away any unused medicine after the expiration date. NOTE: This sheet is a summary. It may not cover all possible information. If you have questions about this medicine, talk to your doctor, pharmacist, or health care provider.    2016, Elsevier/Gold Standard. (2012-12-28 16:27:45)

## 2015-08-26 NOTE — ED Notes (Signed)
Pt's mother came out to desk, states that pt is needing additional pain medication, Dr Preston FleetingGlick notified,

## 2015-08-26 NOTE — ED Notes (Addendum)
Pt c/o right lower quad abd pain that woke him up from sleep tonight, pt states that he has hx of gallstones and this pain feels the same, states that he has an appointment may 30 with Dr Lovell SheehanJenkins,

## 2015-08-26 NOTE — ED Provider Notes (Signed)
CSN: [id number]     Arrival date & time 08/26/15  0214 History   First MD Initiated Contact with Patient 08/26/15 0222     Chief Complaint  Patient presents with  . Abdominal Pain     (Consider location/radiation/quality/duration/timing/severity/associated sxs/prior Treatment) Patient is a 40 y.o. male presenting with abdominal pain. The history is provided by the patient.  Abdominal Pain He has history of bipolar disorder, posttraumatic stress disorder, hypertension. He was recently diagnosed with a gallstone. He was awakened tonight by severe pain across his upper abdomen with slight radiation to the back. There is associated nausea without vomiting. Nothing makes pain better nothing makes it worse. He rates pain at 10/10. There was no fever, chills, sweats. He admits to having eaten a hamburger before going to bed. He does have an appointment to see a surgeon on May 30.  Past Medical History  Diagnosis Date  . Leg pain, right   . Hypertension   . Depression   . Bipolar 1 disorder (HCC)   . PTSD (post-traumatic stress disorder)   . Anxiety disorder   . Back pain   . IVDU (intravenous drug user)   . Allergy   . Anxiety   . Neuromuscular disorder [facility](HCC)    Past Surgical History  Procedure Laterality Date  . Chest tube insertion     Family History  Problem Relation Age of Onset  . COPD Mother   . Heart disease Mother   . Depression Father   . Hyperlipidemia Maternal Grandmother   . Hyperlipidemia Maternal Grandfather   . Hyperlipidemia Paternal Grandmother   . Hyperlipidemia Paternal Grandfather   . Cancer Paternal Grandfather     lung   Social History  Substance Use Topics  . Smoking status: Current Every Day Smoker -- 1.00 packs/day    Types: Cigarettes  . Smokeless tobacco: Never Used  . Alcohol Use: No     Comment: occ. use    Review of Systems  Gastrointestinal: Positive for abdominal pain.  All other systems reviewed and are negative.     Allergies   Amoxicillin; Bee venom; Penicillins; Naproxen; and Tramadol  Home Medications   Prior to Admission medications   Medication Sig Start Date End Date Taking? Authorizing Provider  ALPRAZolam Prudy Feeler(XANAX) 1 MG tablet Take 1 mg by mouth 4 (four) times daily as needed for anxiety.   Yes Historical Provider, MD  PARoxetine (PAXIL) 20 MG tablet Take 20 mg by mouth daily.   Yes Historical Provider, MD  risperiDONE (RISPERDAL) 2 MG tablet Take 2 mg by mouth at bedtime.    Yes Historical Provider, MD  dicyclomine (BENTYL) 20 MG tablet Take 1 tablet (20 mg total) by mouth 2 (two) times daily. 08/02/15   Gerhard Munchobert Lockwood, MD  ondansetron (ZOFRAN ODT) 4 MG disintegrating tablet Take 1 tablet (4 mg total) by mouth every 8 (eight) hours as needed for nausea. 08/04/15   Eber HongBrian Miller, MD   BP 163/116 mmHg  Pulse 77  Temp(Src) 97.7 F (36.5 C) (Oral)  Resp 20  Ht 6' (1.829 m)  Wt 175 lb (79.379 kg)  BMI 23.73 kg/m2  SpO2 100% Physical Exam  Nursing note and vitals reviewed.  40 year old male, who is in obvious pain, but his in no acute distress. Vital signs are significant for hypertension. Oxygen saturation is 100%, which is normal. Head is normocephalic and atraumatic. PERRLA, EOMI. Oropharynx is clear. Neck is nontender and supple without adenopathy or JVD. Back is nontender and there  is no CVA tenderness. Lungs are clear without rales, wheezes, or rhonchi. Chest is nontender. Heart has regular rate and rhythm without murmur. Abdomen is soft, flat, with marked tenderness across the upper abdomen. There are no masses or hepatosplenomegaly and peristalsis is hypoactive. Extremities have no cyanosis or edema, full range of motion is present. Skin is warm and dry without rash. Neurologic: Mental status is normal, cranial nerves are intact, there are no motor or sensory deficits.  ED Course  Procedures (including critical care time) Labs Review Results for orders placed or performed during the hospital  encounter of 08/26/15  Comprehensive metabolic panel  Result Value Ref Range   Sodium 136 135 - 145 mmol/L   Potassium 3.3 (L) 3.5 - 5.1 mmol/L   Chloride 101 101 - 111 mmol/L   CO2 28 22 - 32 mmol/L   Glucose, Bld 125 (H) 65 - 99 mg/dL   BUN 10 6 - 20 mg/dL   Creatinine, Ser 1.61 0.61 - 1.24 mg/dL   Calcium 9.2 8.9 - 09.6 mg/dL   Total Protein 7.0 6.5 - 8.1 g/dL   Albumin 4.0 3.5 - 5.0 g/dL   AST 75 (H) 15 - 41 U/L   ALT 109 (H) 17 - 63 U/L   Alkaline Phosphatase 95 38 - 126 U/L   Total Bilirubin 0.5 0.3 - 1.2 mg/dL   GFR calc non Af Amer >60 >60 mL/min   GFR calc Af Amer >60 >60 mL/min   Anion gap 7 5 - 15  Lipase, blood  Result Value Ref Range   Lipase 56 (H) 11 - 51 U/L  CBC with Differential  Result Value Ref Range   WBC 6.3 4.0 - 10.5 K/uL   RBC 4.73 4.22 - 5.81 MIL/uL   Hemoglobin 15.5 13.0 - 17.0 g/dL   HCT 04.5 40.9 - 81.1 %   MCV 92.8 78.0 - 100.0 fL   MCH 32.8 26.0 - 34.0 pg   MCHC 35.3 30.0 - 36.0 g/dL   RDW 91.4 78.2 - 95.6 %   Platelets 335 150 - 400 K/uL   Neutrophils Relative % 44 %   Neutro Abs 2.8 1.7 - 7.7 K/uL   Lymphocytes Relative 45 %   Lymphs Abs 2.8 0.7 - 4.0 K/uL   Monocytes Relative 7 %   Monocytes Absolute 0.4 0.1 - 1.0 K/uL   Eosinophils Relative 3 %   Eosinophils Absolute 0.2 0.0 - 0.7 K/uL   Basophils Relative 1 %   Basophils Absolute 0.1 0.0 - 0.1 K/uL   I have personally reviewed and evaluated these images and lab results as part of my medical decision-making.   MDM   Final diagnoses:  Biliary colic  Elevated transaminase level  Elevated lipase    Abdominal pain consistent with biliary colic,. Old records were reviewed confirming ultrasound of abdomen on May 1 which showed numerous gallstones. Patient will be given IV hydromorphone and ondansetron and given IV fluids. Screening labs will be obtained to evaluate for possible acute cholecystitis.  Pain was eventually controlled with 2 mg of hydromorphone. Laboratory workup shows  some mild elevation of transaminases which is not significantly changed from baseline. Also, mild elevation of lipase was noted. Clinically, he does not have gallstone pancreatitis. True gallstone pancreatitis should give lipase much higher than what was seen today. Case was discussed with Dr. Earlene Plater who is on-call for Dr. Lovell Sheehan who states that he can see the patient in the office tomorrow to try to expedite his surgery.  Patient is encouraged to stay on a low-fat diet. He is discharged with prescriptions for ondansetron and oxycodone-acetaminophen.  Dione Booze, MD 08/26/15 [phone]

## 2015-08-30 ENCOUNTER — Encounter (HOSPITAL_COMMUNITY)
Admission: RE | Admit: 2015-08-30 | Discharge: 2015-08-30 | Disposition: A | Discharge status: Home / Self Care | Payer: Medicaid Other | Source: Ambulatory Visit | Attending: Surgery | Admitting: Surgery

## 2015-08-30 NOTE — Patient Instructions (Signed)
Theodore Crawford  08/30/2015     @PREFPERIOPPHARMACY @   Your procedure is scheduled on 09/04/2015.  [facility]  Call this number if you have problems the morning of surgery:  [phone]   Remember:  Do not eat food or drink liquids after midnight.  Take these medicines the morning of surgery with A SIP OF WATER : XANAX, PAXIL AND ZOFRAN  Do not wear jewelry, make-up or nail polish.  Do not wear lotions, powders, or perfumes.  You may wear deodorant.  Do not shave 48 hours prior to surgery.  Men may shave face and neck.  Do not bring valuables to the hospital.  [facility] is not responsible for any belongings or valuables.  Contacts, dentures or bridgework may not be worn into surgery.  Leave your suitcase in the car.  After surgery it may be brought to your room.  For patients admitted to the hospital, discharge time will be determined by your treatment team.  Patients discharged the day of surgery will not be allowed to drive home.   Name and phone number of your driver:   FAMILY Special instructions:  N/A  Please read over the following fact sheets that you were given. Care and Recovery After Surgery   Laparoscopic Cholecystectomy Laparoscopic cholecystectomy is surgery to remove the gallbladder. The gallbladder is located in the upper right part of the abdomen, behind the liver. It is a storage sac for bile, which is produced in the liver. Bile aids in the digestion and absorption of fats. Cholecystectomy is often done for inflammation of the gallbladder (cholecystitis). This condition is usually caused by a buildup of gallstones (cholelithiasis) in the gallbladder. Gallstones can block the flow of bile, and that can result in inflammation and pain. In severe cases, emergency surgery may be required. If emergency surgery is not required, you will have time to prepare for the procedure. Laparoscopic surgery is an alternative to open surgery. Laparoscopic  surgery has a shorter recovery time. Your common bile duct may also need to be examined during the procedure. If stones are found in the common bile duct, they may be removed. LET [facility] KNOW ABOUT:  Any allergies you have.  All medicines you are taking, including vitamins, herbs, eye drops, creams, and over-the-counter medicines.  Previous problems you or members of your family have had with the use of anesthetics.  Any blood disorders you have.  Previous surgeries you have had.  Any medical conditions you have. RISKS AND COMPLICATIONS Generally, this is a safe procedure. However, problems may occur, including:  Infection.  Bleeding.  Allergic reactions to medicines.  Damage to other structures or organs.  A stone remaining in the common bile duct.  A bile leak from the cyst duct that is clipped when your gallbladder is removed.  The need to convert to open surgery, which requires a larger incision in the abdomen. This may be necessary if your surgeon thinks that it is not safe to continue with a laparoscopic procedure. BEFORE THE PROCEDURE  Ask your health care provider about:  Changing or stopping your regular medicines. This is especially important if you are taking diabetes medicines or blood thinners.  Taking medicines such as aspirin and ibuprofen. These medicines can thin your blood. Do not take these medicines before your procedure if your health care provider instructs you not to.  Follow instructions from your health care provider about eating or drinking restrictions.  Let your health care provider know if you develop a cold or an infection before surgery.  Plan to have someone take you home after the procedure.  Ask your health care provider how your surgical site will be marked or identified.  You may be given antibiotic medicine to help prevent infection. PROCEDURE  To reduce your risk of infection:  Your health care team will wash  or sanitize their hands.  Your skin will be washed with soap.  An IV tube may be inserted into one of your veins.  You will be given a medicine to make you fall asleep (general anesthetic).  A breathing tube will be placed in your mouth.  The surgeon will make several small cuts (incisions) in your abdomen.  A thin, lighted tube (laparoscope) that has a tiny camera on the end will be inserted through one of the small incisions. The camera on the laparoscope will send a picture to a TV screen (monitor) in the operating room. This will give the surgeon a good view inside your abdomen.  A gas will be pumped into your abdomen. This will expand your abdomen to give the surgeon more room to perform the surgery.  Other tools that are needed for the procedure will be inserted through the other incisions. The gallbladder will be removed through one of the incisions.  After your gallbladder has been removed, the incisions will be closed with stitches (sutures), staples, or skin glue.  Your incisions may be covered with a bandage (dressing). The procedure may vary among health care providers and hospitals. AFTER THE PROCEDURE  Your blood pressure, heart rate, breathing rate, and blood oxygen level will be monitored often until the medicines you were given have worn off.  You will be given medicines as needed to control your pain.   This information is not intended to replace advice given to you by your health care provider. Make sure you discuss any questions you have with your health care provider.   Document Released: 03/23/2005 Document Revised: 12/12/2014 Document Reviewed: 11/02/2012 Elsevier Interactive Patient Education [employer]

## 2015-08-31 ENCOUNTER — Encounter (HOSPITAL_COMMUNITY): Payer: Self-pay

## 2015-08-31 ENCOUNTER — Emergency Department (HOSPITAL_COMMUNITY)
Admission: EM | Admit: 2015-08-31 | Discharge: 2015-09-01 | Disposition: A | Discharge status: Home / Self Care | Payer: Medicaid Other | Attending: Emergency Medicine | Admitting: Emergency Medicine

## 2015-08-31 DIAGNOSIS — F319 Bipolar disorder, unspecified: Secondary | ICD-10-CM | POA: Diagnosis not present

## 2015-08-31 DIAGNOSIS — I10 Essential (primary) hypertension: Secondary | ICD-10-CM | POA: Insufficient documentation

## 2015-08-31 DIAGNOSIS — Z79899 Other long term (current) drug therapy: Secondary | ICD-10-CM | POA: Diagnosis not present

## 2015-08-31 DIAGNOSIS — K8050 Calculus of bile duct without cholangitis or cholecystitis without obstruction: Secondary | ICD-10-CM | POA: Insufficient documentation

## 2015-08-31 DIAGNOSIS — F17210 Nicotine dependence, cigarettes, uncomplicated: Secondary | ICD-10-CM | POA: Diagnosis not present

## 2015-08-31 DIAGNOSIS — R1011 Right upper quadrant pain: Secondary | ICD-10-CM | POA: Diagnosis present

## 2015-08-31 LAB — CBC WITH DIFFERENTIAL/PLATELET
BASOS PCT: 0 %
Basophils Absolute: 0 10*3/uL (ref 0.0–0.1)
EOS ABS: 0.2 10*3/uL (ref 0.0–0.7)
EOS PCT: 2 %
HCT: 39.9 % (ref 39.0–52.0)
Hemoglobin: 13.8 g/dL (ref 13.0–17.0)
Lymphocytes Relative: 17 %
Lymphs Abs: 1.3 10*3/uL (ref 0.7–4.0)
MCH: 32.4 pg (ref 26.0–34.0)
MCHC: 34.6 g/dL (ref 30.0–36.0)
MCV: 93.7 fL (ref 78.0–100.0)
MONO ABS: 0.7 10*3/uL (ref 0.1–1.0)
Monocytes Relative: 8 %
NEUTROS ABS: 5.6 10*3/uL (ref 1.7–7.7)
Neutrophils Relative %: 73 %
PLATELETS: 291 10*3/uL (ref 150–400)
RBC: 4.26 MIL/uL (ref 4.22–5.81)
RDW: 13.4 % (ref 11.5–15.5)
WBC: 7.7 10*3/uL (ref 4.0–10.5)

## 2015-08-31 LAB — COMPREHENSIVE METABOLIC PANEL
ALT: 93 U/L — AB (ref 17–63)
AST: 68 U/L — ABNORMAL HIGH (ref 15–41)
Albumin: 3.8 g/dL (ref 3.5–5.0)
Alkaline Phosphatase: 79 U/L (ref 38–126)
Anion gap: 9 (ref 5–15)
BILIRUBIN TOTAL: 0.7 mg/dL (ref 0.3–1.2)
BUN: 7 mg/dL (ref 6–20)
CALCIUM: 8.9 mg/dL (ref 8.9–10.3)
CHLORIDE: 104 mmol/L (ref 101–111)
CO2: 25 mmol/L (ref 22–32)
CREATININE: 0.88 mg/dL (ref 0.61–1.24)
Glucose, Bld: 111 mg/dL — ABNORMAL HIGH (ref 65–99)
Potassium: 3.3 mmol/L — ABNORMAL LOW (ref 3.5–5.1)
Sodium: 138 mmol/L (ref 135–145)
TOTAL PROTEIN: 6.6 g/dL (ref 6.5–8.1)

## 2015-08-31 LAB — LIPASE, BLOOD: LIPASE: 30 U/L (ref 11–51)

## 2015-08-31 LAB — ETHANOL

## 2015-08-31 MED ORDER — SODIUM CHLORIDE 0.9 % IV SOLN
1000.0000 mL | Freq: Once | INTRAVENOUS | Status: AC
  Administered 2015-09-01: 1000 mL via INTRAVENOUS

## 2015-08-31 MED ORDER — HYDROMORPHONE HCL 1 MG/ML IJ SOLN
1.0000 mg | Freq: Once | INTRAMUSCULAR | Status: AC
  Administered 2015-09-01: 1 mg via INTRAVENOUS
  Filled 2015-08-31: qty 1

## 2015-08-31 MED ORDER — SODIUM CHLORIDE 0.9 % IV SOLN
1000.0000 mL | INTRAVENOUS | Status: DC
  Administered 2015-09-01: 1000 mL via INTRAVENOUS

## 2015-08-31 MED ORDER — ONDANSETRON HCL 4 MG/2ML IJ SOLN
4.0000 mg | Freq: Once | INTRAMUSCULAR | Status: AC
  Administered 2015-09-01: 4 mg via INTRAVENOUS
  Filled 2015-08-31: qty 2

## 2015-08-31 NOTE — ED Notes (Signed)
Having abdominal pain, vomiting, have gallbladder surgery scheduled for Wednesday.  Ran out of pain medication at home.

## 2015-08-31 NOTE — ED Notes (Signed)
Pt ambulated to restroom & returned to room w/ no complications. 

## 2015-08-31 NOTE — ED Notes (Signed)
Pt states pain started after eating tonight. Pt states chicken tenders & homes fries.

## 2015-08-31 NOTE — ED Provider Notes (Signed)
CSN: [id number]     Arrival date & time 08/31/15  2046 History  By signing my name below, I, Linna DarnerRussell Turner, attest that this documentation has been prepared under the direction and in the presence of physician practitioner, Dione Boozeavid Jabir Dahlem, MD. Electronically Signed: Linna Darnerussell Turner, Scribe. 08/31/2015. 11:42 PM.   Chief Complaint  Patient presents with  . Abdominal Pain    The history is provided by the patient. No language interpreter was used.     HPI Comments: Theodore Crawford is a 40 y.o. male with h/o gallstones who presents to the Emergency Department complaining of sudden onset, constant, 9/10 abdominal pain beginning a few hours ago. Pt reports that he ate chicken tenders and fries tonight and his abdominal pain presented shortly thereafter. He endorses associated nausea and vomiting x2 but reports that he does not feel nauseous currently. Pt visited the ER recently for abdominal pain and was referred to a surgeon; he has surgery scheduled on May 31st for gallstones. Pt reports that he had Zofran leftover from a recent ER visit and has taken it for nausea but has not tried any other medications for his symptoms. He denies fever, chills, or any other associated symptoms.  Past Medical History  Diagnosis Date  . Leg pain, right   . Hypertension   . Depression   . Bipolar 1 disorder (HCC)   . PTSD (post-traumatic stress disorder)   . Anxiety disorder   . Back pain   . IVDU (intravenous drug user)   . Allergy   . Anxiety   . Neuromuscular disorder [facility](HCC)    Past Surgical History  Procedure Laterality Date  . Chest tube insertion     Family History  Problem Relation Age of Onset  . COPD Mother   . Heart disease Mother   . Depression Father   . Hyperlipidemia Maternal Grandmother   . Hyperlipidemia Maternal Grandfather   . Hyperlipidemia Paternal Grandmother   . Hyperlipidemia Paternal Grandfather   . Cancer Paternal Grandfather     lung   Social History  Substance Use Topics   . Smoking status: Current Every Day Smoker -- 1.00 packs/day    Types: Cigarettes  . Smokeless tobacco: Never Used  . Alcohol Use: No     Comment: occ. use    Review of Systems  Constitutional: Negative for fever and chills.  Gastrointestinal: Positive for nausea, vomiting and abdominal pain.  All other systems reviewed and are negative.  Allergies  Amoxicillin; Bee venom; Penicillins; Naproxen; and Tramadol  Home Medications   Prior to Admission medications   Medication Sig Start Date End Date Taking? Authorizing Provider  ALPRAZolam Prudy Feeler(XANAX) 1 MG tablet Take 1 mg by mouth 4 (four) times daily as needed for anxiety.   Yes Historical Provider, MD  ondansetron (ZOFRAN) 4 MG tablet Take 1 tablet (4 mg total) by mouth every 6 (six) hours as needed. 08/26/15  Yes Dione Boozeavid Edoardo Laforte, MD  PARoxetine (PAXIL) 20 MG tablet Take 20 mg by mouth daily.   Yes Historical Provider, MD  risperiDONE (RISPERDAL) 3 MG tablet Take 3-6 mg by mouth 2 (two) times daily. 6mg  at bedtime and 3mg  in the morning   Yes Historical Provider, MD  oxyCODONE-acetaminophen (PERCOCET) 5-325 MG tablet Take 1 tablet by mouth every 4 (four) hours as needed for moderate pain. Patient not taking: Reported on 08/31/2015 08/26/15   Dione Boozeavid Delsin Copen, MD   BP 129/80 mmHg  Pulse 78  Temp(Src) 99 F (37.2 C) (Oral)  Resp 16  Ht 6' (1.829 m)  Wt 175 lb (79.379 kg)  BMI 23.73 kg/m2  SpO2 99% Physical Exam  Constitutional: He is oriented to person, place, and time. He appears well-developed and well-nourished. No distress.  HENT:  Head: Normocephalic and atraumatic.  Eyes: Conjunctivae and EOM are normal. Pupils are equal, round, and reactive to light. No scleral icterus.  Neck: Normal range of motion. Neck supple. No JVD present.  Cardiovascular: Normal rate, regular rhythm and normal heart sounds.   No murmur heard. Pulmonary/Chest: Effort normal and breath sounds normal. He has no wheezes. He has no rales. He exhibits no tenderness.   Abdominal: Soft. Bowel sounds are normal. He exhibits no distension and no mass. There is tenderness. There is no rebound and no guarding.  Moderate RUQ tenderness.  Musculoskeletal: Normal range of motion. He exhibits no edema.  Lymphadenopathy:    He has no cervical adenopathy.  Neurological: He is alert and oriented to person, place, and time. No cranial nerve deficit. He exhibits normal muscle tone. Coordination normal.  Skin: Skin is warm and dry. No rash noted.  Psychiatric: He has a normal mood and affect. His behavior is normal. Judgment and thought content normal.  Nursing note and vitals reviewed.   ED Course  Procedures (including critical care time)  DIAGNOSTIC STUDIES: Oxygen Saturation is 99% on RA, normal by my interpretation.    COORDINATION OF CARE: 11:42 PM Discussed treatment plan with pt at bedside and pt agreed to plan.  Labs Review Results for orders placed or performed during the hospital encounter of 08/31/15  Comprehensive metabolic panel  Result Value Ref Range   Sodium 138 135 - 145 mmol/L   Potassium 3.3 (L) 3.5 - 5.1 mmol/L   Chloride 104 101 - 111 mmol/L   CO2 25 22 - 32 mmol/L   Glucose, Bld 111 (H) 65 - 99 mg/dL   BUN 7 6 - 20 mg/dL   Creatinine, Ser 5.78 0.61 - 1.24 mg/dL   Calcium 8.9 8.9 - 46.9 mg/dL   Total Protein 6.6 6.5 - 8.1 g/dL   Albumin 3.8 3.5 - 5.0 g/dL   AST 68 (H) 15 - 41 U/L   ALT 93 (H) 17 - 63 U/L   Alkaline Phosphatase 79 38 - 126 U/L   Total Bilirubin 0.7 0.3 - 1.2 mg/dL   GFR calc non Af Amer >60 >60 mL/min   GFR calc Af Amer >60 >60 mL/min   Anion gap 9 5 - 15  Ethanol  Result Value Ref Range   Alcohol, Ethyl (B) <5 <5 mg/dL  Lipase, blood  Result Value Ref Range   Lipase 30 11 - 51 U/L  CBC with Differential  Result Value Ref Range   WBC 7.7 4.0 - 10.5 K/uL   RBC 4.26 4.22 - 5.81 MIL/uL   Hemoglobin 13.8 13.0 - 17.0 g/dL   HCT 62.9 52.8 - 41.3 %   MCV 93.7 78.0 - 100.0 fL   MCH 32.4 26.0 - 34.0 pg   MCHC  34.6 30.0 - 36.0 g/dL   RDW 24.4 01.0 - 27.2 %   Platelets 291 150 - 400 K/uL   Neutrophils Relative % 73 %   Neutro Abs 5.6 1.7 - 7.7 K/uL   Lymphocytes Relative 17 %   Lymphs Abs 1.3 0.7 - 4.0 K/uL   Monocytes Relative 8 %   Monocytes Absolute 0.7 0.1 - 1.0 K/uL   Eosinophils Relative 2 %   Eosinophils Absolute 0.2 0.0 - 0.7  K/uL   Basophils Relative 0 %   Basophils Absolute 0.0 0.0 - 0.1 K/uL   I have personally reviewed and evaluated these images and lab results as part of my medical decision-making.   MDM   Final diagnoses:  Biliary colic    Patient with known history of cholelithiasis with episodes of biliary colic presents with recurrent biliary colic after eating a high-fat meal of chicken tenders and french fries. He is scheduled for elective cholecystectomy in 4 days. He is given IV fluids and hydromorphone and ondansetron with good relief of pain. Laboratory workup shows normal WBC and stable to slightly improving transaminase levels. No evidence of acute cholecystitis. Old records are reviewed confirming prior ED visits for biliary colic and scheduled for elective cholecystectomy. He is discharged with prescription for ondansetron and oxycodone-acetaminophen and is advised to stay on a low fat diet and return for his scheduled cholecystectomy.  I personally performed the services described in this documentation, which was scribed in my presence. The recorded information has been reviewed and is accurate.     Dione Booze, MD 09/01/15 [phone]

## 2015-09-01 MED ORDER — OXYCODONE-ACETAMINOPHEN 5-325 MG PO TABS: 1.0000 | ORAL_TABLET | ORAL | Status: DC | PRN

## 2015-09-01 MED ORDER — ONDANSETRON HCL 4 MG PO TABS: 4.0000 mg | ORAL_TABLET | Freq: Four times a day (QID) | ORAL | Status: DC | PRN

## 2015-09-01 NOTE — Discharge Instructions (Signed)
Stay on a low-fat diet. Do not eat anything which is fried, anything with oil or mayonnaise. Do not eat any cheese.  Biliary Colic Biliary colic is a pain in the upper abdomen. The pain:  Is usually felt on the right side of the abdomen, but it may also be felt in the center of the abdomen, just below the breastbone (sternum).  May spread back toward the right shoulder blade.  May be steady or irregular.  May be accompanied by nausea and vomiting. Most of the time, the pain goes away in 1-5 hours. After the most intense pain passes, the abdomen may continue to ache mildly for about 24 hours. Biliary colic is caused by a blockage in the bile duct. The bile duct is a pathway that carries bile--a liquid that helps to digest fats--from the gallbladder to the small intestine. Biliary colic usually occurs after eating, when the digestive system demands bile. The pain develops when muscle cells contract forcefully to try to move the blockage so that bile can get by. HOME CARE INSTRUCTIONS  Take medicines only as directed by your health care provider.  Drink enough fluid to keep your urine clear or pale yellow.  Avoid fatty, greasy, and fried foods. These kinds of foods increase your body's demand for bile.  Avoid any foods that make your pain worse.  Avoid overeating.  Avoid having a large meal after fasting. SEEK MEDICAL CARE IF:  You develop a fever.  Your pain gets worse.  You vomit.  You develop nausea that prevents you from eating and drinking. SEEK IMMEDIATE MEDICAL CARE IF:  You suddenly develop a fever and shaking chills.  You develop a yellowish discoloration (jaundice) of:  Skin.  Whites of the eyes.  Mucous membranes.  You have continuous or severe pain that is not relieved with medicines.  You have nausea and vomiting that is not relieved with medicines.  You develop dizziness or you faint.   This information is not intended to replace advice given to you by  your health care provider. Make sure you discuss any questions you have with your health care provider.   Document Released: 08/24/2005 Document Revised: 08/07/2014 Document Reviewed: 01/02/2014 Elsevier Interactive Patient Education [employer]

## 2015-09-01 NOTE — ED Notes (Signed)
Pt alert & oriented x4, stable gait. Patient given discharge instructions, paperwork & prescription(s). Patient informed not to drive, operate any equipment & handel any important documents 4 hours after taking pain medication. Patient  instructed to stop at the registration desk to finish any additional paperwork. Patient  verbalized understanding. Pt left department w/ no further questions. 

## 2015-09-04 ENCOUNTER — Ambulatory Visit (HOSPITAL_COMMUNITY): Payer: Medicaid Other | Admitting: Anesthesiology

## 2015-09-04 ENCOUNTER — Ambulatory Visit (HOSPITAL_COMMUNITY)
Admission: RE | Admit: 2015-09-04 | Discharge: 2015-09-04 | Disposition: A | Discharge status: Home / Self Care | Payer: Medicaid Other | Source: Ambulatory Visit | Attending: Surgery | Admitting: Surgery

## 2015-09-04 ENCOUNTER — Encounter (HOSPITAL_COMMUNITY)
Admission: RE | Disposition: A | Discharge status: Home / Self Care | Payer: Self-pay | Source: Ambulatory Visit | Attending: Surgery

## 2015-09-04 DIAGNOSIS — K8010 Calculus of gallbladder with chronic cholecystitis without obstruction: Secondary | ICD-10-CM | POA: Insufficient documentation

## 2015-09-04 DIAGNOSIS — Z79899 Other long term (current) drug therapy: Secondary | ICD-10-CM | POA: Diagnosis not present

## 2015-09-04 DIAGNOSIS — F329 Major depressive disorder, single episode, unspecified: Secondary | ICD-10-CM | POA: Insufficient documentation

## 2015-09-04 DIAGNOSIS — K8020 Calculus of gallbladder without cholecystitis without obstruction: Secondary | ICD-10-CM

## 2015-09-04 DIAGNOSIS — F17200 Nicotine dependence, unspecified, uncomplicated: Secondary | ICD-10-CM | POA: Diagnosis not present

## 2015-09-04 DIAGNOSIS — F17210 Nicotine dependence, cigarettes, uncomplicated: Secondary | ICD-10-CM | POA: Insufficient documentation

## 2015-09-04 DIAGNOSIS — F319 Bipolar disorder, unspecified: Secondary | ICD-10-CM | POA: Insufficient documentation

## 2015-09-04 DIAGNOSIS — F4310 Post-traumatic stress disorder, unspecified: Secondary | ICD-10-CM | POA: Insufficient documentation

## 2015-09-04 DIAGNOSIS — I10 Essential (primary) hypertension: Secondary | ICD-10-CM | POA: Insufficient documentation

## 2015-09-04 HISTORY — PX: GALLBLADDER SURGERY: SHX652

## 2015-09-04 HISTORY — PX: CHOLECYSTECTOMY: SHX55

## 2015-09-04 SURGERY — LAPAROSCOPIC CHOLECYSTECTOMY
Anesthesia: General | Site: Abdomen

## 2015-09-04 MED ORDER — PROPOFOL 10 MG/ML IV BOLUS
INTRAVENOUS | Status: AC
  Filled 2015-09-04: qty 20

## 2015-09-04 MED ORDER — FENTANYL CITRATE (PF) 100 MCG/2ML IJ SOLN
25.0000 ug | INTRAMUSCULAR | Status: DC | PRN
  Administered 2015-09-04: 50 ug via INTRAVENOUS
  Filled 2015-09-04: qty 2

## 2015-09-04 MED ORDER — BUPIVACAINE HCL (PF) 0.5 % IJ SOLN
INTRAMUSCULAR | Status: AC
  Filled 2015-09-04: qty 30

## 2015-09-04 MED ORDER — GLYCOPYRROLATE 0.2 MG/ML IJ SOLN
INTRAMUSCULAR | Status: AC
  Filled 2015-09-04: qty 3

## 2015-09-04 MED ORDER — ROCURONIUM BROMIDE 50 MG/5ML IV SOLN
INTRAVENOUS | Status: AC
  Filled 2015-09-04: qty 1

## 2015-09-04 MED ORDER — ONDANSETRON HCL 4 MG/2ML IJ SOLN
4.0000 mg | Freq: Once | INTRAMUSCULAR | Status: AC
  Administered 2015-09-04: 4 mg via INTRAVENOUS
  Filled 2015-09-04: qty 2

## 2015-09-04 MED ORDER — FENTANYL CITRATE (PF) 250 MCG/5ML IJ SOLN
INTRAMUSCULAR | Status: AC
  Filled 2015-09-04: qty 5

## 2015-09-04 MED ORDER — LIDOCAINE HCL 1 % IJ SOLN
INTRAMUSCULAR | Status: DC | PRN
  Administered 2015-09-04: 11 mL

## 2015-09-04 MED ORDER — NEOSTIGMINE METHYLSULFATE 10 MG/10ML IV SOLN
INTRAVENOUS | Status: DC | PRN
  Administered 2015-09-04: 3 mg via INTRAVENOUS

## 2015-09-04 MED ORDER — SODIUM CHLORIDE 0.9 % IR SOLN
Status: DC | PRN
  Administered 2015-09-04: 1000 mL

## 2015-09-04 MED ORDER — GLYCOPYRROLATE 0.2 MG/ML IJ SOLN
0.2000 mg | Freq: Once | INTRAMUSCULAR | Status: AC
  Administered 2015-09-04: 0.2 mg via INTRAVENOUS
  Filled 2015-09-04: qty 1

## 2015-09-04 MED ORDER — MIDAZOLAM HCL 2 MG/2ML IJ SOLN
1.0000 mg | INTRAMUSCULAR | Status: DC | PRN
Start: 2015-09-04 — End: 2015-09-06
  Administered 2015-09-04 (×3): 2 mg via INTRAVENOUS
  Filled 2015-09-04 (×3): qty 2

## 2015-09-04 MED ORDER — MIDAZOLAM HCL 2 MG/2ML IJ SOLN
INTRAMUSCULAR | Status: AC
  Filled 2015-09-04: qty 2

## 2015-09-04 MED ORDER — LIDOCAINE HCL (PF) 1 % IJ SOLN
INTRAMUSCULAR | Status: AC
  Filled 2015-09-04: qty 30

## 2015-09-04 MED ORDER — LACTATED RINGERS IV SOLN
INTRAVENOUS | Status: DC
  Administered 2015-09-04: 1000 mL via INTRAVENOUS
  Administered 2015-09-04: 10:00:00 via INTRAVENOUS

## 2015-09-04 MED ORDER — ROCURONIUM BROMIDE 100 MG/10ML IV SOLN
INTRAVENOUS | Status: DC | PRN
  Administered 2015-09-04: 5 mg via INTRAVENOUS
  Administered 2015-09-04: 35 mg via INTRAVENOUS

## 2015-09-04 MED ORDER — PROPOFOL 10 MG/ML IV BOLUS
INTRAVENOUS | Status: DC | PRN
  Administered 2015-09-04: 200 mg via INTRAVENOUS

## 2015-09-04 MED ORDER — FENTANYL CITRATE (PF) 100 MCG/2ML IJ SOLN
INTRAMUSCULAR | Status: DC | PRN
  Administered 2015-09-04 (×6): 50 ug via INTRAVENOUS
  Administered 2015-09-04: 150 ug via INTRAVENOUS
  Administered 2015-09-04: 50 ug via INTRAVENOUS

## 2015-09-04 MED ORDER — VANCOMYCIN HCL IN DEXTROSE 1-5 GM/200ML-% IV SOLN
1000.0000 mg | INTRAVENOUS | Status: AC
  Administered 2015-09-04: 1000 mg via INTRAVENOUS
  Filled 2015-09-04: qty 200

## 2015-09-04 MED ORDER — OXYCODONE-ACETAMINOPHEN 5-325 MG PO TABS: 1.0000 | ORAL_TABLET | ORAL | Status: DC | PRN

## 2015-09-04 MED ORDER — ROCURONIUM BROMIDE 50 MG/5ML IV SOLN
INTRAVENOUS | Status: AC
Start: 2015-09-04 — End: 2015-09-04
  Filled 2015-09-04: qty 1

## 2015-09-04 MED ORDER — LIDOCAINE HCL 1 % IJ SOLN
INTRAMUSCULAR | Status: DC | PRN
  Administered 2015-09-04: 50 mg via INTRADERMAL

## 2015-09-04 MED ORDER — ONDANSETRON HCL 4 MG/2ML IJ SOLN: 4.0000 mg | Freq: Once | INTRAMUSCULAR | Status: DC | PRN

## 2015-09-04 MED ORDER — SODIUM CHLORIDE 0.9 % IJ SOLN
INTRAMUSCULAR | Status: AC
  Filled 2015-09-04: qty 10

## 2015-09-04 MED ORDER — CHLORHEXIDINE GLUCONATE 4 % EX LIQD: 1.0000 "application " | Freq: Once | CUTANEOUS | Status: DC

## 2015-09-04 MED ORDER — GLYCOPYRROLATE 0.2 MG/ML IJ SOLN
INTRAMUSCULAR | Status: DC | PRN
  Administered 2015-09-04: 0.6 mg via INTRAVENOUS

## 2015-09-04 MED ORDER — LIDOCAINE HCL (PF) 1 % IJ SOLN
INTRAMUSCULAR | Status: AC
  Filled 2015-09-04: qty 5

## 2015-09-04 SURGICAL SUPPLY — 51 items
ADH SKN CLS APL DERMABOND .7 (GAUZE/BANDAGES/DRESSINGS) ×1
APPLIER CLIP LAPSCP 10X32 DD (CLIP) ×3 IMPLANT
BAG HAMPER (MISCELLANEOUS) ×3 IMPLANT
BAG SPEC RTRVL LRG 6X4 10 (ENDOMECHANICALS) ×1
CHLORAPREP W/TINT 26ML (MISCELLANEOUS) ×3 IMPLANT
CLOTH BEACON ORANGE TIMEOUT ST (SAFETY) ×3 IMPLANT
COVER LIGHT HANDLE STERIS (MISCELLANEOUS) ×6 IMPLANT
DECANTER SPIKE VIAL GLASS SM (MISCELLANEOUS) ×5 IMPLANT
DERMABOND ADVANCED (GAUZE/BANDAGES/DRESSINGS) ×2
DERMABOND ADVANCED .7 DNX12 (GAUZE/BANDAGES/DRESSINGS) ×1 IMPLANT
DEVICE TROCAR PUNCTURE CLOSURE (ENDOMECHANICALS) ×2 IMPLANT
ELECT REM PT RETURN 9FT ADLT (ELECTROSURGICAL) ×3
ELECTRODE REM PT RTRN 9FT ADLT (ELECTROSURGICAL) ×1 IMPLANT
FILTER SMOKE EVAC LAPAROSHD (FILTER) ×3 IMPLANT
FORMALIN 10 PREFIL 120ML (MISCELLANEOUS) ×3 IMPLANT
GLOVE BIOGEL PI IND STRL 7.0 (GLOVE) ×1 IMPLANT
GLOVE BIOGEL PI IND STRL 7.5 (GLOVE) ×1 IMPLANT
GLOVE BIOGEL PI INDICATOR 7.0 (GLOVE) ×2
GLOVE BIOGEL PI INDICATOR 7.5 (GLOVE) ×2
GLOVE ECLIPSE 6.5 STRL STRAW (GLOVE) ×2 IMPLANT
GLOVE ECLIPSE 7.0 STRL STRAW (GLOVE) ×3 IMPLANT
GLOVE EXAM NITRILE MD LF STRL (GLOVE) ×2 IMPLANT
GLOVE SS N UNI LF 7.5 STRL (GLOVE) ×2 IMPLANT
GOWN STRL REUS W/ TWL XL LVL3 (GOWN DISPOSABLE) ×1 IMPLANT
GOWN STRL REUS W/TWL LRG LVL3 (GOWN DISPOSABLE) ×6 IMPLANT
GOWN STRL REUS W/TWL XL LVL3 (GOWN DISPOSABLE) ×3
HEMOSTAT SNOW SURGICEL 2X4 (HEMOSTASIS) ×3 IMPLANT
INST SET LAPROSCOPIC AP (KITS) ×3 IMPLANT
IV NS IRRIG 3000ML ARTHROMATIC (IV SOLUTION) IMPLANT
KIT ROOM TURNOVER APOR (KITS) ×3 IMPLANT
MANIFOLD NEPTUNE II (INSTRUMENTS) ×3 IMPLANT
NDL INSUFFLATION 14GA 120MM (NEEDLE) ×1 IMPLANT
NEEDLE INSUFFLATION 14GA 120MM (NEEDLE) ×3 IMPLANT
NS IRRIG 1000ML POUR BTL (IV SOLUTION) ×3 IMPLANT
PACK LAP CHOLE LZT030E (CUSTOM PROCEDURE TRAY) ×3 IMPLANT
PAD ARMBOARD 7.5X6 YLW CONV (MISCELLANEOUS) ×3 IMPLANT
POUCH SPECIMEN RETRIEVAL 10MM (ENDOMECHANICALS) ×3 IMPLANT
SET BASIN LINEN APH (SET/KITS/TRAYS/PACK) ×3 IMPLANT
SET TUBE IRRIG SUCTION NO TIP (IRRIGATION / IRRIGATOR) IMPLANT
SLEEVE ENDOPATH XCEL 5M (ENDOMECHANICALS) ×3 IMPLANT
SPONGE GAUZE 2X2 8PLY STER LF (GAUZE/BANDAGES/DRESSINGS)
SPONGE GAUZE 2X2 8PLY STRL LF (GAUZE/BANDAGES/DRESSINGS) ×4 IMPLANT
SUT MNCRL AB 4-0 PS2 18 (SUTURE) ×5 IMPLANT
SUT VICRYL 0 UR6 27IN ABS (SUTURE) ×5 IMPLANT
SUT VICRYL AB 3-0 FS1 BRD 27IN (SUTURE) ×2 IMPLANT
TROCAR ENDO BLADELESS 11MM (ENDOMECHANICALS) ×3 IMPLANT
TROCAR XCEL NON-BLD 5MMX100MML (ENDOMECHANICALS) ×3 IMPLANT
TROCAR XCEL UNIV SLVE 11M 100M (ENDOMECHANICALS) ×3 IMPLANT
TUBING INSUFFLATION (TUBING) ×3 IMPLANT
WARMER LAPAROSCOPE (MISCELLANEOUS) ×3 IMPLANT
YANKAUER SUCT 12FT TUBE ARGYLE (SUCTIONS) ×3 IMPLANT

## 2015-09-04 NOTE — Anesthesia Preprocedure Evaluation (Addendum)
Anesthesia Evaluation  Patient identified by MRN, date of birth, ID band Patient awake    Reviewed: Allergy & Precautions, NPO status , Patient's Chart, lab work & pertinent test results  Airway Mallampati: I  TM Distance: >3 FB     Dental  (+) Edentulous Upper, Poor Dentition, Dental Advisory Given   Pulmonary Current Smoker,    breath sounds clear to auscultation       Cardiovascular hypertension, Pt. on medications  Rhythm:Regular Rate:Normal     Neuro/Psych PSYCHIATRIC DISORDERS (PTSD) Anxiety Depression Bipolar Disorder    GI/Hepatic negative GI ROS, (+)     substance abuse  IV drug use,   Endo/Other    Renal/GU      Musculoskeletal   Abdominal   Peds  Hematology   Anesthesia Other Findings Benzodiazepine abuse hx  Reproductive/Obstetrics                           Anesthesia Physical Anesthesia Plan  ASA: III  Anesthesia Plan: General   Post-op Pain Management:    Induction: Intravenous  Airway Management Planned: Oral ETT  Additional Equipment:   Intra-op Plan:   Post-operative Plan: Extubation in OR  Informed Consent: I have reviewed the patients History and Physical, chart, labs and discussed the procedure including the risks, benefits and alternatives for the proposed anesthesia with the patient or authorized representative who has indicated his/her understanding and acceptance.     Plan Discussed with:   Anesthesia Plan Comments:         Anesthesia Quick Evaluation

## 2015-09-04 NOTE — H&P (Signed)
SURGICAL HISTORY & PHYSICAL   HISTORY OF PRESENT ILLNESS (HPI):  40 y.o. male presented to [facility] ED with recurrent episodes of symptomatic cholelithiasis. He was also seen in the office, where non-fat diet and cholecystectomy were discussed, along with risks, benefits, and alternatives of surgery. Patient presents today for elective cholecystectomy. He denies any IV drug use >1 month and has only taken Percocet as prescribed for abdominal pain. He recently re-presented to ED for symptomatic cholelithiasis after eating fried chicken. Pain resolved, and patient was discharged home.  PAST MEDICAL HISTORY (PMH):  Past Medical History  Diagnosis Date  . Leg pain, right   . Hypertension   . Depression   . Bipolar 1 disorder (HCC)   . PTSD (post-traumatic stress disorder)   . Anxiety disorder   . Back pain   . IVDU (intravenous drug user)   . Allergy   . Anxiety   . Neuromuscular disorder (HCC)     Reviewed. Otherwise negative.   PAST SURGICAL HISTORY [facility]):  Past Surgical History  Procedure Laterality Date  . Chest tube insertion      Reviewed. Otherwise negative.   MEDICATIONS:  Prior to Admission medications   Medication Sig Start Date End Date Taking? Authorizing Provider  ALPRAZolam Prudy Feeler) 1 MG tablet Take 1 mg by mouth 4 (four) times daily as needed for anxiety.   Yes Historical Provider, MD  ondansetron (ZOFRAN) 4 MG tablet Take 1 tablet (4 mg total) by mouth every 6 (six) hours as needed. 09/01/15  Yes Dione Booze, MD  oxyCODONE-acetaminophen (PERCOCET) 5-325 MG tablet Take 1 tablet by mouth every 4 (four) hours as needed for moderate pain. 09/01/15  Yes Dione Booze, MD  PARoxetine (PAXIL) 20 MG tablet Take 20 mg by mouth daily.   Yes Historical Provider, MD  risperiDONE (RISPERDAL) 3 MG tablet Take 3-6 mg by mouth 2 (two) times daily.  at bedtime and  in the morning   Yes Historical Provider, MD     ALLERGIES:  Allergies  Allergen Reactions  . Amoxicillin Anaphylaxis     swelling  . Bee Venom Anaphylaxis  . Penicillins Anaphylaxis    Has patient had a PCN reaction causing immediate rash, facial/tongue/throat swelling, SOB or lightheadedness with hypotension: Noyes Has patient had a PCN reaction causing severe rash involving mucus membranes or skin necrosis: Nono Has patient had a PCN reaction that required hospitalization Nono Has patient had a PCN reaction occurring within the last 10 years: Nono If all of the above answers are "NO", then may proceed with Cephalosporin  . Naproxen     Made sick on stomach.    . Tramadol     Made sick to stomach     SOCIAL HISTORY:  Social History   Social History  . Marital Status: Single    Spouse Name: N/A  . Number of Children: N/A  . Years of Education: N/A   Occupational History  . Not on file.   Social History Main Topics  . Smoking status: Current Every Day Smoker -- 1.00 packs/day    Types: Cigarettes  . Smokeless tobacco: Never Used  . Alcohol Use: No     Comment: occ. use  . Drug Use: No     Comment: former  . Sexual Activity: Yes    Birth Control/ Protection: Condom, None   Other Topics Concern  . Not on file   Social History Narrative   ** Merged History Encounter **        The  patient currently resides (home / rehab facility / nursing home): Home  The patient normally is (ambulatory / bedbound) : Ambulatory   FAMILY HISTORY:  Family History  Problem Relation Age of Onset  . COPD Mother   . Heart disease Mother   . Depression Father   . Hyperlipidemia Maternal Grandmother   . Hyperlipidemia Maternal Grandfather   . Hyperlipidemia Paternal Grandmother   . Hyperlipidemia Paternal Grandfather   . Cancer Paternal Grandfather     lung    Otherwise negative.   REVIEW OF SYSTEMS:  Constitutional: denies any other weight loss, fever, chills, or sweats  Eyes: denies any other vision changes, history of eye injury  ENT: denies sore throat, hearing problems  Respiratory: denies  shortness of breath, wheezing  Cardiovascular: denies chest pain, palpitations  Gastrointestinal: denies N/V, diarrhea  Musculoskeletal: denies any other joint pains or cramps  Skin: Denies any other rashes or skin discolorations  Neurological: denies any other headache, dizziness, weakness  Psychiatric: denies any other depression, anxiety   All other review of systems were otherwise negative.  VITAL SIGNS:  Temp:  [98 F (36.7 C)] 98 F (36.7 C) (05/31 0800) Resp:  [10-22] 17 (05/31 0825) BP: (116-132)/(87-94) 119/89 mmHg (05/31 0825) SpO2:  [98 %-99 %] 99 % (05/31 0825)             INTAKE/OUTPUT:  This shift:    Last 2 shifts: @IOLAST2SHIFTS @  PHYSICAL EXAM:  Constitutional:  -- Normal body habitus  -- Awake, alert, and oriented x3  Eyes:  -- Pupils equally round and reactive to light  -- No scleral icterus  Ear, nose, throat:  -- No jugular venous distension  Pulmonary:  -- No crackles  -- Equal breath sounds bilaterally  Cardiovascular:  -- S1, S2 present  -- No pericardial rubs  Gastrointestinal:  -- Soft, nontender, nondistended, no guarding/rebound  --  No pulsatile abdominal mass appreciated  Musculoskeletal / Integumentary:  -- Wounds or skin discoloration:  None -- Extremities: B/L UE and LE FROM, hands and feet warm, no edema  Neurologic:  -- Motor function: Intact and symmetric  -- Sensation: Intact and symmetric    Labs:  CBC:  Lab Results  Component Value Date   WBC 7.7 08/31/2015   WBC 4.6 05/30/2015   RBC 4.26 08/31/2015   RBC 4.23 05/30/2015   HEMOGLOBIN 13.8 05/30/2015   BMP:  Lab Results  Component Value Date   GLUCOSE 111* 08/31/2015   GLUCOSE 82 05/30/2015   CO2 25 08/31/2015   BUN 7 08/31/2015   BUN 5* 05/30/2015   CREATININE 0.88 08/31/2015   CALCIUM 8.9 08/31/2015     Imaging studies:  Abdominal Ultrasound (08/05/2015) 1. Gallstones are noted within gallbladder the largest measures 1.7 cm. No thickening of gallbladder  wall. No sonographic Murphy's sign. Normal CBD. 2. Mild fatty infiltration of the liver. 3. No hydronephrosis. Left renal cyst measures 2.3 x 1.7 cm. 4. No aortic aneurysm.   Assessment/Plan:  40 y.o. male with recurrent symptomatic cholelithiasis, complicated by pertinent comorbidities including history of polysubstance abuse including IV cocaine and opiates.    - NPO   - all risks, benefits, and alternatives to cholecystectomy discussed with patient and all of his questions were answered to his expressed satisfaction, after which informed consent was obtained and documented in the chart  - plan for laparoscopic cholecystectomy today   -- Barbara CowerJason E. Earlene Plateravis, MD [place]: [facility] Office: [phone]

## 2015-09-04 NOTE — Anesthesia Postprocedure Evaluation (Signed)
Anesthesia Post Note  Patient: Theodore Crawford  Procedure(s) Performed: Procedure(s) (LRB): LAPAROSCOPIC CHOLECYSTECTOMY (N/A)  Patient location during evaluation: PACU Anesthesia Type: General Level of consciousness: awake and alert Pain management: satisfactory to patient Vital Signs Assessment: post-procedure vital signs reviewed and stable Respiratory status: spontaneous breathing Cardiovascular status: stable Anesthetic complications: no    Last Vitals:  Filed Vitals:   09/04/15 1115 09/04/15 1138  BP: 143/104 139/95  Pulse: 80 79  Temp:  36.8 C  Resp: 12 16    Last Pain:  Filed Vitals:   09/04/15 1139  PainSc: 3                  Fadil Macmaster

## 2015-09-04 NOTE — Discharge Instructions (Signed)
Laparoscopic Cholecystectomy, Care After °Refer to this sheet in the next few weeks. These instructions provide you with information about caring for yourself after your procedure. Your health care provider may also give you more specific instructions. Your treatment has been planned according to current medical practices, but problems sometimes occur. Call your health care provider if you have any problems or questions after your procedure. °WHAT TO EXPECT AFTER THE PROCEDURE °After your procedure, it is common to have: °· Pain at your incision sites. You will be given pain medicines to control your pain. °· Mild nausea or vomiting. This should improve after the first 24 hours. °· Bloating and possible shoulder pain from the gas that was used during the procedure. This will improve after the first 24 hours. °HOME CARE INSTRUCTIONS °Incision Care °· Follow instructions from your health care provider about how to take care of your incisions. Make sure you: °¨ Wash your hands with soap and water before you change your bandage (dressing). If soap and water are not available, use hand sanitizer. °¨ Change your dressing as told by your health care provider. °¨ Leave stitches (sutures), skin glue, or adhesive strips in place. These skin closures may need to be in place for 2 weeks or longer. If adhesive strip edges start to loosen and curl up, you may trim the loose edges. Do not remove adhesive strips completely unless your health care provider tells you to do that. °· Do not take baths, swim, or use a hot tub until your health care provider approves. Ask your health care provider if you can take showers. You may only be allowed to take sponge baths for bathing. °General Instructions °· Take over-the-counter and prescription medicines only as told by your health care provider. °· Do not drive or operate heavy machinery while taking prescription pain medicine. °· Return to your normal diet as told by your health care  provider. °· Do not lift anything that is heavier than 10 lb (4.5 kg). °· Do not play contact sports for one week or until your health care provider approves. °SEEK MEDICAL CARE IF:  °· You have redness, swelling, or pain at the site of your incision. °· You have fluid, blood, or pus coming from your incision. °· You notice a bad smell coming from your incision area. °· Your surgical incisions break open. °· You have a fever. °SEEK IMMEDIATE MEDICAL CARE IF: °· You develop a rash. °· You have difficulty breathing. °· You have chest pain. °· You have increasing pain in your shoulders (shoulder strap areas). °· You faint or have dizzy episodes while you are standing. °· You have severe pain in your abdomen. °· You have nausea or vomiting that lasts for more than one day. °  °This information is not intended to replace advice given to you by your health care provider. Make sure you discuss any questions you have with your health care provider. °  °Document Released: 03/23/2005 Document Revised: 12/12/2014 Document Reviewed: 11/02/2012 °Elsevier Interactive Patient Education ©2016 Elsevier Inc. ° °

## 2015-09-04 NOTE — Op Note (Signed)
SURGICAL OPERATIVE REPORT   DATE OF PROCEDURE: 09/04/2015   ATTENDING SURGEON: Surgeon(s): Ancil LinseyJason Evan Davis, MD Franky MachoMark Jenkins, MD  ANESTHESIA: GETA  PRE-OPERATIVE DIAGNOSIS: Symptomatic Cholelithiasis  POST-OPERATIVE DIAGNOSIS: Symptomatic Cholelithiasis  PROCEDURE(S):  1.) Laparoscopic Cholecystectomy  INTRAOPERATIVE FINDINGS: Cholelithiasis   INTRAOPERATIVE FLUIDS: 1200 mL crystalloid   ESTIMATED BLOOD LOSS: Minimal (<30 mL)   URINE OUTPUT: No foley  SPECIMENS: Gallbladder  IMPLANTS: None  DRAINS: None   COMPLICATIONS: None apparent   CONDITION AT COMPLETION: Hemodynamically stable and extubated  DISPOSITION: PACU   INDICATION(S) FOR PROCEDURE:  Patient is a 40 y.o. male who previously presented with symptomatic cholelithiasis. Ultrasound suggested gallstones without cholecystitis. All risks, benefits, and alternatives to above elective procedures were discussed with the patient, who elected to proceed, and informed consent was accordingly obtained at that time.   DETAILS OF PROCEDURE:  Patient was brought to the operating suite and appropriately identified. General anesthesia was administered along with peri-operative prophylactic IV antibiotics, and endotracheal intubation was performed by anesthesiologist, along with NG/OG tube for gastric decompression. In supine position, operative site was prepped and draped in usual sterile fashion, and following a brief time out, initial 10 mm incision was made in a natural skin crease just above the umbilicus. Fascia was then elevated, and a Verress needle was inserted and its proper position confirmed using aspiration and saline meniscus test.  Upon insufflation of the abdominal cavity with carbon dioxide to a well-tolerated pressure of 12-15 mmHg, 10 mm peri-umbilical port followed by laparoscope were inserted and used to inspect the abdominal cavity and its contents with no injuries from insertion of the first trochar noted.  Three additional trocars were inserted, one at the epigastric position (10 mm) and two along the Right costal margin (5 mm). The table was then placed in reverse Trendelenburg position with the Right side up. Filmy adhesions between the gallbladder and omentum/duodenum/transverse colon were lysed using combined blunt and sharp dissection. The apex/dome of the gallbladder was grasped with an atraumatic grasper passed through the lateral port and retracted apically over the liver. The infundibulum was also grasped and retracted, exposing Calot's triangle. The peritoneum overlying the gallbladder infundibulum was incised and dissected free of surrounding peritoneal attachments, revealing the cystic duct and cystic artery, which were clipped twice on the patient side and once on the gallbladder specimen side close to the gallbladder. The gallbladder was then dissected from its peritoneal attachments to the liver using electrocautery, and the gallbladder was placed into a laparoscopic specimen bag and removed from the abdominal cavity via the epigastric port site. Hemostasis and secure placement of clips were confirmed, and intra-peritoneal cavity was inspected with no additional findings.  All ports were then removed under direct visualization, and abdominal cavity was desuflated. All port sites were irrigated/cleaned, fascia was re-approximated using 0-0 Vicryl suture at any port sites >7 mm, additional local anesthetic was injected at each incision, and subcuticular 4-0 Monocryl suture was used to re-approximate skin. Skin was then cleaned, dried, and sterile skin glue was applied. Patient was then safely able to be awakened, extubated, and transferred to PACU for post-operative monitoring and care.   I was present for all aspects of procedure, and there were no intra-operative complications apparent.

## 2015-09-04 NOTE — Anesthesia Procedure Notes (Signed)
Procedure Name: Intubation Date/Time: 09/04/2015 9:14 AM Performed by: Franco NonesYATES, Kevona Lupinacci S Pre-anesthesia Checklist: Patient identified, Patient being monitored, Timeout performed, Emergency Drugs available and Suction available Patient Re-evaluated:Patient Re-evaluated prior to inductionOxygen Delivery Method: Circle System Utilized Preoxygenation: Pre-oxygenation with 100% oxygen Intubation Type: IV induction Ventilation: Mask ventilation without difficulty and Oral airway inserted - appropriate to patient size Laryngoscope Size: Hyacinth MeekerMiller and 2 Grade View: Grade I Tube type: Oral Tube size: 7.0 mm Number of attempts: 1 Airway Equipment and Method: Stylet Placement Confirmation: ETT inserted through vocal cords under direct vision,  positive ETCO2 and breath sounds checked- equal and bilateral Secured at: 21 cm Tube secured with: Tape Dental Injury: Teeth and Oropharynx as per pre-operative assessment

## 2015-09-04 NOTE — Transfer of Care (Signed)
Immediate Anesthesia Transfer of Care Note  Patient: Theodore Crawford  Procedure(s) Performed: Procedure(s): LAPAROSCOPIC CHOLECYSTECTOMY (N/A)  Patient Location: PACU  Anesthesia Type:General  Level of Consciousness: awake and patient cooperative  Airway & Oxygen Therapy: Patient Spontanous Breathing and non-rebreather face mask  Post-op Assessment: Report given to RN, Post -op Vital signs reviewed and stable and Patient moving all extremities  Post vital signs: Reviewed and stable   Last Pain:  Filed Vitals:   09/04/15 0903  PainSc: 8          Complications: No apparent anesthesia complications

## 2015-09-05 ENCOUNTER — Encounter (HOSPITAL_COMMUNITY): Payer: Self-pay | Admitting: Surgery

## 2015-09-08 ENCOUNTER — Emergency Department (HOSPITAL_COMMUNITY)
Admission: EM | Admit: 2015-09-08 | Discharge: 2015-09-08 | Disposition: A | Discharge status: Home / Self Care | Payer: Medicaid Other | Attending: Emergency Medicine | Admitting: Emergency Medicine

## 2015-09-08 ENCOUNTER — Encounter (HOSPITAL_COMMUNITY): Payer: Self-pay | Admitting: Emergency Medicine

## 2015-09-08 DIAGNOSIS — R1011 Right upper quadrant pain: Secondary | ICD-10-CM | POA: Diagnosis not present

## 2015-09-08 DIAGNOSIS — Z9049 Acquired absence of other specified parts of digestive tract: Secondary | ICD-10-CM | POA: Diagnosis not present

## 2015-09-08 DIAGNOSIS — F17210 Nicotine dependence, cigarettes, uncomplicated: Secondary | ICD-10-CM | POA: Insufficient documentation

## 2015-09-08 DIAGNOSIS — F319 Bipolar disorder, unspecified: Secondary | ICD-10-CM | POA: Insufficient documentation

## 2015-09-08 DIAGNOSIS — G8918 Other acute postprocedural pain: Secondary | ICD-10-CM | POA: Diagnosis present

## 2015-09-08 DIAGNOSIS — I10 Essential (primary) hypertension: Secondary | ICD-10-CM | POA: Insufficient documentation

## 2015-09-08 DIAGNOSIS — Z79899 Other long term (current) drug therapy: Secondary | ICD-10-CM | POA: Insufficient documentation

## 2015-09-08 MED ORDER — OXYCODONE-ACETAMINOPHEN 5-325 MG PO TABS
1.0000 | ORAL_TABLET | Freq: Once | ORAL | Status: AC
  Administered 2015-09-08: 1 via ORAL
  Filled 2015-09-08: qty 1

## 2015-09-08 MED ORDER — OXYCODONE-ACETAMINOPHEN 5-325 MG PO TABS: 1.0000 | ORAL_TABLET | Freq: Four times a day (QID) | ORAL | Status: DC | PRN

## 2015-09-08 NOTE — Discharge Instructions (Signed)
As additional control for your pain, try things like a heating pad 3 or 4 times a day, ibuprofen, and resting. Call your surgeon in the morning to arrange further treatment.   Abdominal Pain, Adult Many things can cause abdominal pain. Usually, abdominal pain is not caused by a disease and will improve without treatment. It can often be observed and treated at home. Your health care provider will do a physical exam and possibly order blood tests and X-rays to help determine the seriousness of your pain. However, in many cases, more time must pass before a clear cause of the pain can be found. Before that point, your health care provider may not know if you need more testing or further treatment. HOME CARE INSTRUCTIONS Monitor your abdominal pain for any changes. The following actions may help to alleviate any discomfort you are experiencing:  Only take over-the-counter or prescription medicines as directed by your health care provider.  Do not take laxatives unless directed to do so by your health care provider.  Try a clear liquid diet (broth, tea, or water) as directed by your health care provider. Slowly move to a bland diet as tolerated. SEEK MEDICAL CARE IF:  You have unexplained abdominal pain.  You have abdominal pain associated with nausea or diarrhea.  You have pain when you urinate or have a bowel movement.  You experience abdominal pain that wakes you in the night.  You have abdominal pain that is worsened or improved by eating food.  You have abdominal pain that is worsened with eating fatty foods.  You have a fever. SEEK IMMEDIATE MEDICAL CARE IF:  Your pain does not go away within 2 hours.  You keep throwing up (vomiting).  Your pain is felt only in portions of the abdomen, such as the right side or the left lower portion of the abdomen.  You pass bloody or black tarry stools. MAKE SURE YOU:  Understand these instructions.  Will watch your condition.  Will  get help right away if you are not doing well or get worse.   This information is not intended to replace advice given to you by your health care provider. Make sure you discuss any questions you have with your health care provider.   Document Released: 12/31/2004 Document Revised: 12/12/2014 Document Reviewed: 11/30/2012 Elsevier Interactive Patient Education [employer]2016 Else[employer] Inc.

## 2015-09-08 NOTE — ED Provider Notes (Signed)
CSN: [id number]     Arrival date & time 09/08/15  1626 History   First MD Initiated Contact with Patient 09/08/15 1655     Chief Complaint  Patient presents with  . Post-op Problem     (Consider location/radiation/quality/duration/timing/severity/associated sxs/prior Treatment) HPI   Theodore Crawford is a 40 y.o. male here for evaluation of abdominal pain, following risk for cholecystectomy, on 09/04/2015. Surgery was uncomplicated and he was discharged, same day. He has run out of his narcotic analgesia, and his pain is uncontrolled. He denies fever, vomiting, cough, shortness of breath or chest pain. Pain is worse with deep breathing and movement. Patient has chronic pain, currently off Suboxone, and is known to have drug-seeking behavior. He has a care plan, which was reviewed by me. There are no other known modifying factors.   Past Medical History  Diagnosis Date  . Leg pain, right   . Hypertension   . Depression   . Bipolar 1 disorder (HCC)   . PTSD (post-traumatic stress disorder)   . Anxiety disorder   . Back pain   . IVDU (intravenous drug user)   . Allergy   . Anxiety   . Neuromuscular disorder [facility](HCC)    Past Surgical History  Procedure Laterality Date  . Chest tube insertion    . Cholecystectomy N/A 09/04/2015    Procedure: LAPAROSCOPIC CHOLECYSTECTOMY;  Surgeon: Ancil LinseyJason Evan Davis, MD;  Location: [facility];  Service: General;  Laterality: N/A;   Family History  Problem Relation Age of Onset  . COPD Mother   . Heart disease Mother   . Depression Father   . Hyperlipidemia Maternal Grandmother   . Hyperlipidemia Maternal Grandfather   . Hyperlipidemia Paternal Grandmother   . Hyperlipidemia Paternal Grandfather   . Cancer Paternal Grandfather     lung   Social History  Substance Use Topics  . Smoking status: Current Every Day Smoker -- 1.00 packs/day    Types: Cigarettes  . Smokeless tobacco: Never Used  . Alcohol Use: No     Comment: occ. use    Review of  Systems  All other systems reviewed and are negative.     Allergies  Amoxicillin; Bee venom; Penicillins; Naproxen; and Tramadol  Home Medications   Prior to Admission medications   Medication Sig Start Date End Date Taking? Authorizing Provider  ALPRAZolam Prudy Feeler(XANAX) 1 MG tablet Take 1 mg by mouth 4 (four) times daily as needed for anxiety.   Yes Historical Provider, MD  ondansetron (ZOFRAN) 4 MG tablet Take 1 tablet (4 mg total) by mouth every 6 (six) hours as needed. 09/01/15  Yes Dione Boozeavid Glick, MD  PARoxetine (PAXIL) 20 MG tablet Take 20 mg by mouth daily.   Yes Historical Provider, MD  risperiDONE (RISPERDAL) 3 MG tablet Take 3-6 mg by mouth 2 (two) times daily. 6mg  at bedtime and 3mg  in the morning   Yes Historical Provider, MD  oxyCODONE-acetaminophen (PERCOCET) 5-325 MG tablet Take 1 tablet by mouth every 6 (six) hours as needed for severe pain. 09/08/15   Mancel BaleElliott Jaedan Huttner, MD   BP 115/81 mmHg  Pulse 89  Temp(Src) 98.5 F (36.9 C) (Oral)  Resp 18  Ht 6' (1.829 m)  Wt 175 lb (79.379 kg)  BMI 23.73 kg/m2  SpO2 100% Physical Exam  Constitutional: He is oriented to person, place, and time. He appears well-developed and well-nourished.  HENT:  Head: Normocephalic and atraumatic.  Right Ear: External ear normal.  Left Ear: External ear normal.  Eyes: Conjunctivae  and EOM are normal. Pupils are equal, round, and reactive to light.  Neck: Normal range of motion and phonation normal. Neck supple.  Cardiovascular: Normal rate, regular rhythm and normal heart sounds.   Pulmonary/Chest: Effort normal and breath sounds normal. He exhibits no bony tenderness.  Abdominal: Soft.  Healing laparoscopic wounds. No areas of erythema, or drainage. Wounds are well approximated. Small amount of ecchymosis in the suprapubic wound region. Mild right upper quadrant tenderness. No rebound tenderness.  Musculoskeletal: Normal range of motion.  Neurological: He is alert and oriented to person, place, and  time. No cranial nerve deficit or sensory deficit. He exhibits normal muscle tone. Coordination normal.  Skin: Skin is warm, dry and intact.  Psychiatric: He has a normal mood and affect. His behavior is normal. Judgment and thought content normal.  Nursing note and vitals reviewed.   ED Course  Procedures (including critical care time)   Medications  oxyCODONE-acetaminophen (PERCOCET/ROXICET) 5-325 MG per tablet 1 tablet (not administered)    Patient Vitals for the past 24 hrs:  BP Temp Temp src Pulse Resp SpO2 Height Weight  09/08/15 1635 115/81 mmHg 98.5 F (36.9 C) Oral 89 18 100 % 6' (1.829 m) 175 lb (79.379 kg)    5:14 PM Reevaluation with update and discussion. After initial assessment and treatment, an updated evaluation reveals No additional complaints. Findings discussed with patient and all questions were answered. Shirely Toren L    Labs Review Labs Reviewed - No data to display  Imaging Review No results found. I have personally reviewed and evaluated these images and lab results as part of my medical decision-making.   EKG Interpretation None      MDM   Final diagnoses:  Right upper quadrant pain    Abdominal pain, postoperative day #5. No fever, chills, tachycardia, tachypnea, or hypotension. Doubt intra-abdominal infection, wound infection or other postoperative complication. The patient is known to have chronic pain, and drug-seeking behavior. At this time. I cannot rule out significant postoperative discomfort from his multiple wounds. Therefore, I will give him the benefit of the doubt and prescribe a limited dose of Percocet. The patient does have access to further care with either his primary care doctor or his surgeon, whom he can see as needed.  Nursing Notes Reviewed/ Care Coordinated Applicable Imaging Reviewed Interpretation of Laboratory Data incorporated into ED treatment  The patient appears reasonably screened and/or stabilized for  discharge and I doubt any other medical condition or other [facility] requiring further screening, evaluation, or treatment in the ED at this time prior to discharge.  Plan: Home Medications- Percocet #20; Home Treatments- rest, heat; return here if the recommended treatment, does not improve the symptoms; Recommended follow up- PCP and General Surgery prn   Mancel Bale, MD 09/08/15 1721

## 2015-09-08 NOTE — ED Notes (Signed)
Pt has ran out of pain meds, per visitor they have called yesterday and pharmacy would not accept a call in for pain med

## 2015-09-08 NOTE — ED Notes (Signed)
Pt states he has been having a lot of pain following gall bladder sx Wednesday.  States he was written for 2 days of pain medication and is out.

## 2015-09-12 ENCOUNTER — Encounter (HOSPITAL_COMMUNITY): Payer: Self-pay

## 2015-09-12 ENCOUNTER — Emergency Department (HOSPITAL_COMMUNITY)
Admission: EM | Admit: 2015-09-12 | Discharge: 2015-09-12 | Disposition: A | Discharge status: Home / Self Care | Payer: Medicaid Other | Attending: Emergency Medicine | Admitting: Emergency Medicine

## 2015-09-12 DIAGNOSIS — X58XXXA Exposure to other specified factors, initial encounter: Secondary | ICD-10-CM | POA: Diagnosis not present

## 2015-09-12 DIAGNOSIS — S46911A Strain of unspecified muscle, fascia and tendon at shoulder and upper arm level, right arm, initial encounter: Secondary | ICD-10-CM | POA: Diagnosis not present

## 2015-09-12 DIAGNOSIS — Y929 Unspecified place or not applicable: Secondary | ICD-10-CM | POA: Insufficient documentation

## 2015-09-12 DIAGNOSIS — F329 Major depressive disorder, single episode, unspecified: Secondary | ICD-10-CM | POA: Diagnosis not present

## 2015-09-12 DIAGNOSIS — Y939 Activity, unspecified: Secondary | ICD-10-CM | POA: Insufficient documentation

## 2015-09-12 DIAGNOSIS — F319 Bipolar disorder, unspecified: Secondary | ICD-10-CM | POA: Diagnosis not present

## 2015-09-12 DIAGNOSIS — F17210 Nicotine dependence, cigarettes, uncomplicated: Secondary | ICD-10-CM | POA: Diagnosis not present

## 2015-09-12 DIAGNOSIS — I10 Essential (primary) hypertension: Secondary | ICD-10-CM | POA: Insufficient documentation

## 2015-09-12 DIAGNOSIS — S4991XA Unspecified injury of right shoulder and upper arm, initial encounter: Secondary | ICD-10-CM | POA: Diagnosis present

## 2015-09-12 DIAGNOSIS — Y999 Unspecified external cause status: Secondary | ICD-10-CM | POA: Insufficient documentation

## 2015-09-12 MED ORDER — DIAZEPAM 5 MG PO TABS
10.0000 mg | ORAL_TABLET | Freq: Once | ORAL | Status: AC
  Administered 2015-09-12: 10 mg via ORAL
  Filled 2015-09-12: qty 2

## 2015-09-12 MED ORDER — CYCLOBENZAPRINE HCL 10 MG PO TABS: 10.0000 mg | ORAL_TABLET | Freq: Three times a day (TID) | ORAL | Status: DC

## 2015-09-12 NOTE — Discharge Instructions (Signed)
Muscle Strain °A muscle strain (pulled muscle) happens when a muscle is stretched beyond normal length. It happens when a sudden, violent force stretches your muscle too far. Usually, a few of the fibers in your muscle are torn. Muscle strain is common in athletes. Recovery usually takes 1-2 weeks. Complete healing takes 5-6 weeks.  °HOME CARE  °· Follow the PRICE method of treatment to help your injury get better. Do this the first 2-3 days after the injury: °¨ Protect. Protect the muscle to keep it from getting injured again. °¨ Rest. Limit your activity and rest the injured body part. °¨ Ice. Put ice in a plastic bag. Place a towel between your skin and the bag. Then, apply the ice and leave it on from 15-20 minutes each hour. After the third day, switch to moist heat packs. °¨ Compression. Use a splint or elastic bandage on the injured area for comfort. Do not put it on too tightly. °¨ Elevate. Keep the injured body part above the level of your heart. °· Only take medicine as told by your doctor. °· Warm up before doing exercise to prevent future muscle strains. °GET HELP IF:  °· You have more pain or puffiness (swelling) in the injured area. °· You feel numbness, tingling, or notice a loss of strength in the injured area. °MAKE SURE YOU:  °· Understand these instructions. °· Will watch your condition. °· Will get help right away if you are not doing well or get worse. °  °This information is not intended to replace advice given to you by your health care provider. Make sure you discuss any questions you have with your health care provider. °  °Document Released: 12/31/2007 Document Revised: 01/11/2013 Document Reviewed: 10/20/2012 °Elsevier Interactive Patient Education ©2016 [employer] ° °

## 2015-09-12 NOTE — ED Notes (Signed)
Pt reports pain in r side of neck and shoulder.  Reports has been laying in the bed because he recently had gall bladder surgery.

## 2015-09-12 NOTE — ED Notes (Signed)
Pt verbalized understanding of no driving and to use caution within 4 hours of taking pain meds due to meds cause drowsiness 

## 2015-09-12 NOTE — ED Provider Notes (Signed)
CSN: [id number]     Arrival date & time 09/12/15  1752 History   None    Chief Complaint  Patient presents with  . Neck Pain     (Consider location/radiation/quality/duration/timing/severity/associated sxs/prior Treatment) HPI Comments: Patient is a 40 year old male who presents to the emergency department with right neck and shoulder area pain.  The patient states that he recently had gallbladder surgery. He states he's been trying to not use his right upper extremity a lot because of pain and discomfort and also to ensure that he did not interfere with the gallbladder surgery site. He states that 3 nights ago he made a turn in the bed, and had pain in his right neck extending into the right shoulder. Since that time he's been having increasing pain. He is not having any problem with grip. He is not dropping objects, but is having pain from the right neck to the right shoulder area.  The history is provided by the patient.    Past Medical History  Diagnosis Date  . Leg pain, right   . Hypertension   . Depression   . Bipolar 1 disorder (HCC)   . PTSD (post-traumatic stress disorder)   . Anxiety disorder   . Back pain   . IVDU (intravenous drug user)   . Allergy   . Anxiety   . Neuromuscular disorder [facility])    Past Surgical History  Procedure Laterality Date  . Chest tube insertion    . Cholecystectomy N/A 09/04/2015    Procedure: LAPAROSCOPIC CHOLECYSTECTOMY;  Surgeon: Ancil Linsey, MD;  Location: [facility];  Service: General;  Laterality: N/A;   Family History  Problem Relation Age of Onset  . COPD Mother   . Heart disease Mother   . Depression Father   . Hyperlipidemia Maternal Grandmother   . Hyperlipidemia Maternal Grandfather   . Hyperlipidemia Paternal Grandmother   . Hyperlipidemia Paternal Grandfather   . Cancer Paternal Grandfather     lung   Social History  Substance Use Topics  . Smoking status: Current Every Day Smoker -- 1.00 packs/day    Types:  Cigarettes  . Smokeless tobacco: Never Used  . Alcohol Use: No     Comment: occ. use    Review of Systems  Constitutional: Negative for activity change.       All ROS Neg except as noted in HPI  HENT: Negative for nosebleeds.   Eyes: Negative for photophobia and discharge.  Respiratory: Negative for cough, shortness of breath and wheezing.   Cardiovascular: Negative for chest pain and palpitations.  Gastrointestinal: Negative for abdominal pain and blood in stool.  Genitourinary: Negative for dysuria, frequency and hematuria.  Musculoskeletal: Positive for back pain and arthralgias. Negative for neck pain.       Right shoulder pain  Skin: Negative.   Neurological: Negative for dizziness, seizures and speech difficulty.  Psychiatric/Behavioral: Negative for hallucinations and confusion.       Bipolar disorder      Allergies  Amoxicillin; Bee venom; Penicillins; Naproxen; and Tramadol  Home Medications   Prior to Admission medications   Medication Sig Start Date End Date Taking? Authorizing Provider  ALPRAZolam Prudy Feeler) 1 MG tablet Take 1 mg by mouth 4 (four) times daily as needed for anxiety.    Historical Provider, MD  ondansetron (ZOFRAN) 4 MG tablet Take 1 tablet (4 mg total) by mouth every 6 (six) hours as needed. 09/01/15   Dione Booze, MD  oxyCODONE-acetaminophen (PERCOCET) 5-325 MG tablet Take 1  tablet by mouth every 6 (six) hours as needed for severe pain. 09/08/15   Mancel BaleElliott Wentz, MD  PARoxetine (PAXIL) 20 MG tablet Take 20 mg by mouth daily.    Historical Provider, MD  risperiDONE (RISPERDAL) 3 MG tablet Take 3-6 mg by mouth 2 (two) times daily. 6mg  at bedtime and 3mg  in the morning    Historical Provider, MD   BP 132/88 mmHg  Pulse 78  Temp(Src) 98 F (36.7 C) (Oral)  Resp 18  Wt 81.647 kg  SpO2 98% Physical Exam  Constitutional: He is oriented to person, place, and time. He appears well-developed and well-nourished.  Non-toxic appearance.  HENT:  Head:  Normocephalic.  Right Ear: Tympanic membrane and external ear normal.  Left Ear: Tympanic membrane and external ear normal.  Eyes: EOM and lids are normal. Pupils are equal, round, and reactive to light.  Neck: Normal range of motion. Neck supple. Carotid bruit is not present.  Cardiovascular: Normal rate, regular rhythm, normal heart sounds, intact distal pulses and normal pulses.   Pulmonary/Chest: Breath sounds normal. No respiratory distress.  Abdominal: Soft. Bowel sounds are normal. There is no tenderness. There is no guarding.  Musculoskeletal: Normal range of motion.       Right shoulder: He exhibits spasm.       Arms: Lymphadenopathy:       Head (right side): No submandibular adenopathy present.       Head (left side): No submandibular adenopathy present.    He has no cervical adenopathy.  Neurological: He is alert and oriented to person, place, and time. He has normal strength. No cranial nerve deficit or sensory deficit.  Skin: Skin is warm and dry.  Psychiatric: He has a normal mood and affect. His speech is normal.  Nursing note and vitals reviewed.   ED Course  Procedures (including critical care time) Labs Review Labs Reviewed - No data to display  Imaging Review No results found. I have personally reviewed and evaluated these images and lab results as part of my medical decision-making.   EKG Interpretation None      MDM  Patient sustained an injury to the right neck and shoulder when he made a turn in the bed while trying to protect the right side as a result of gallbladder surgery. The examination favors a muscle strain.. The patient will be treated with Flexeril 3 times daily. The patient is to use heating pad to the area as well.    Final diagnoses:  None    *I have reviewed nursing notes, vital signs, and all appropriate lab and imaging results for this patient.[address]    Covey Baller, PA-C 09/12/15 1907  Vanetta MuldersScott Zackowski, MD 09/20/15 [phone]

## 2015-09-13 MED FILL — Oxycodone w/ Acetaminophen Tab 5-325 MG: ORAL | Qty: 6 | Status: AC

## 2015-09-17 ENCOUNTER — Encounter: Payer: Self-pay | Admitting: Family Medicine

## 2015-09-17 ENCOUNTER — Ambulatory Visit (INDEPENDENT_AMBULATORY_CARE_PROVIDER_SITE_OTHER): Payer: Medicaid Other | Admitting: Family Medicine

## 2015-09-17 ENCOUNTER — Ambulatory Visit: Payer: Medicaid Other | Admitting: Family Medicine

## 2015-09-17 VITALS — BP 121/74 | HR 113 | Temp 97.3°F | Ht 73.83 in | Wt 164.8 lb

## 2015-09-17 DIAGNOSIS — F1990 Other psychoactive substance use, unspecified, uncomplicated: Secondary | ICD-10-CM | POA: Diagnosis not present

## 2015-09-17 DIAGNOSIS — F1911 Other psychoactive substance abuse, in remission: Secondary | ICD-10-CM | POA: Insufficient documentation

## 2015-09-17 DIAGNOSIS — M549 Dorsalgia, unspecified: Secondary | ICD-10-CM

## 2015-09-17 DIAGNOSIS — G8929 Other chronic pain: Secondary | ICD-10-CM

## 2015-09-17 NOTE — Progress Notes (Signed)
   HPI  Patient presents today here with chronic back pain.  Patient explains that he was prescribed Percocet around the time of his surgery and had great pain relief with that. He explains that he is not taking Suboxone any longer but would like to try Percocet for his back pain.  The pain from his gallbladder removal as well controlled.  He states that he's allergic to tramadol, hydrocodone is " like eating M&M's" but oxycodone works well for him.  He does have a history of drug abuse, and December he was admitted to the hospital for cellulitis related to injecting cocaine.  He states that his pain is at baseline.  PMH: Smoking status noted ROS: Per HPI  Objective: BP 121/74 mmHg  Pulse 113  Temp(Src) 97.3 F (36.3 C) (Oral)  Ht 6' 1.82" (1.875 m)  Wt 164 lb 12.8 oz (74.753 kg)  BMI 21.26 kg/m2 Gen: NAD, alert, cooperative with exam HEENT: NCAT CV: RRR, good S1/S2, no murmur Resp: CTABL, no wheezes, non-labored Ext: No edema, warm Neuro: Alert and oriented, walking with a left-handed cane. Musculoskeletal Tenderness to palpation of lumbar spine over the bony prominences.  Skin Healing puncture wounds from laparoscopic surgery on abdominal wall, no signs of infection  Assessment and plan:  # Chronic back pain, history of drug abuse I discussed with him that I'm unable to give him narcotics given his history of drug abuse. I have referred him to pain management. I have encouraged him that maintaining Suboxone would be the best thing, however he has logistical issues getting to the clinic. Every pain medication that I offered he had a good reason not to take it, unfortunately I'm not able to help him with any pharmacologic treatments for pain today.   Orders Placed This Encounter  Procedures  . Ambulatory referral to Pain Clinic    Referral Priority:  Routine    Referral Type:  Consultation    Referral Reason:  Specialty Services Required    Requested Specialty:   Pain Medicine    Number of Visits Requested:  1     Murtis SinkSam Ketty Bitton, MD [facility] 09/17/2015, 11:09 AM

## 2015-09-17 NOTE — Patient Instructions (Signed)
Great to see you!  We will call with a referral, we are checking with [place] and [place]

## 2015-09-30 ENCOUNTER — Telehealth: Payer: Self-pay | Admitting: Family Medicine

## 2015-10-07 NOTE — Telephone Encounter (Signed)
Dr Gerilyn [place]  No new pain management   Referred to [place] [facility]

## 2015-10-22 ENCOUNTER — Encounter (HOSPITAL_COMMUNITY): Payer: Self-pay | Admitting: Emergency Medicine

## 2015-10-22 ENCOUNTER — Emergency Department (HOSPITAL_COMMUNITY)
Admission: EM | Admit: 2015-10-22 | Discharge: 2015-10-22 | Disposition: A | Discharge status: Home / Self Care | Payer: Medicaid Other | Attending: Emergency Medicine | Admitting: Emergency Medicine

## 2015-10-22 ENCOUNTER — Emergency Department (HOSPITAL_COMMUNITY): Payer: Medicaid Other

## 2015-10-22 DIAGNOSIS — F329 Major depressive disorder, single episode, unspecified: Secondary | ICD-10-CM | POA: Insufficient documentation

## 2015-10-22 DIAGNOSIS — Z791 Long term (current) use of non-steroidal anti-inflammatories (NSAID): Secondary | ICD-10-CM | POA: Insufficient documentation

## 2015-10-22 DIAGNOSIS — M25561 Pain in right knee: Secondary | ICD-10-CM | POA: Insufficient documentation

## 2015-10-22 DIAGNOSIS — I10 Essential (primary) hypertension: Secondary | ICD-10-CM | POA: Insufficient documentation

## 2015-10-22 DIAGNOSIS — F17210 Nicotine dependence, cigarettes, uncomplicated: Secondary | ICD-10-CM | POA: Diagnosis not present

## 2015-10-22 DIAGNOSIS — Z79899 Other long term (current) drug therapy: Secondary | ICD-10-CM | POA: Diagnosis not present

## 2015-10-22 DIAGNOSIS — M549 Dorsalgia, unspecified: Secondary | ICD-10-CM | POA: Diagnosis not present

## 2015-10-22 MED ORDER — IBUPROFEN 800 MG PO TABS: 800.0000 mg | ORAL_TABLET | Freq: Three times a day (TID) | ORAL | Status: DC

## 2015-10-22 NOTE — ED Notes (Signed)
PT c/o right knee pain after picking up a heavy object and twisting to turn his whole body and started having right knee pain. PT ambulatory from triage.

## 2015-10-22 NOTE — Discharge Instructions (Signed)
Knee immobilizer for comfort. Make an appointment to follow-up with orthopedics. Continue the Motrin 800 mg every 8 hours for the next 7 days. X-rays were negative from a bony standpoint.

## 2015-10-22 NOTE — ED Provider Notes (Addendum)
CSN: [id number]     Arrival date & time 10/22/15  0865 History  By signing my name below, I, Theodore Crawford, attest that this documentation has been prepared under the direction and in the presence of Theodore Mulders, MD. Electronically Signed: Doreatha Crawford, ED Scribe. 10/22/2015. 9:56 AM.   Chief Complaint  Patient presents with  . Knee Pain   Patient is a 40 y.o. male presenting with knee pain. The history is provided by the patient. No language interpreter was used.  Knee Pain Location:  Knee Time since incident:  4 days Injury: yes   Mechanism of injury comment:  Heavy lifting, stair climbing Knee location:  R knee Pain details:    Radiates to:  Does not radiate   Severity:  Moderate   Onset quality:  Gradual   Duration:  4 days   Timing:  Constant   Progression:  Worsening Chronicity:  New Foreign body present:  No foreign bodies Worsened by:  Bearing weight and activity Associated symptoms: back pain (chronic, not changed)   Associated symptoms: no fever and no neck pain   Risk factors: no obesity    HPI Comments: Theodore Crawford is a 40 y.o. male who presents to the Emergency Department complaining of moderate, gradual onset, 7/10 medial and posterior right knee pain onset 4 days ago. Pt notes that his pain began the day after he was helping a friend move and reports this activity involved extensive heavy lifting and stair climbing. He denies additional falls, injuries or trauma. Pt states that pain is worsened with movement, ambulation and weight-bearing. He reports that he has chronic back pain and RLE numbness, but states these two symptoms have not worsened from baseline at this time. Pt denies erythema over the knee, fever, congestion, rhinorrhea, sore throat, visual disturbance, cough, SOB, CP, leg swelling, abdominal pain, diarrhea, nausea, vomiting, dysuria, hematuria, neck pain, rash, HA, bruising easily.    Past Medical History  Diagnosis Date  . Leg pain, right   .  Hypertension   . Depression   . Bipolar 1 disorder (HCC)   . PTSD (post-traumatic stress disorder)   . Anxiety disorder   . Back pain   . IVDU (intravenous drug user)   . Allergy   . Anxiety   . Neuromuscular disorder [facility])    Past Surgical History  Procedure Laterality Date  . Chest tube insertion    . Cholecystectomy N/A 09/04/2015    Procedure: LAPAROSCOPIC CHOLECYSTECTOMY;  Surgeon: Ancil Linsey, MD;  Location: [facility];  Service: General;  Laterality: N/A;  . Gallbladder surgery  09/04/2015   Family History  Problem Relation Age of Onset  . COPD Mother   . Heart disease Mother   . Depression Father   . Hyperlipidemia Maternal Grandmother   . Hyperlipidemia Maternal Grandfather   . Hyperlipidemia Paternal Grandmother   . Hyperlipidemia Paternal Grandfather   . Cancer Paternal Grandfather     lung   Social History  Substance Use Topics  . Smoking status: Current Every Day Smoker -- 1.00 packs/day    Types: Cigarettes  . Smokeless tobacco: Never Used  . Alcohol Use: No     Comment: occ. use    Review of Systems  Constitutional: Negative for fever.  HENT: Negative for congestion, rhinorrhea and sore throat.   Eyes: Negative for visual disturbance.  Respiratory: Negative for cough and shortness of breath.   Cardiovascular: Negative for chest pain and leg swelling.  Gastrointestinal: Negative for nausea, vomiting,  abdominal pain and diarrhea.  Genitourinary: Negative for dysuria and hematuria.  Musculoskeletal: Positive for back pain (chronic, not changed) and arthralgias (right knee). Negative for neck pain.  Skin: Negative for color change and rash.  Neurological: Positive for numbness (chronic, not changed). Negative for headaches.  Hematological: Does not bruise/bleed easily.  Psychiatric/Behavioral: Negative for confusion.   Allergies  Amoxicillin; Bee venom; Penicillins; Naproxen; Tramadol; and Voltaren  Home Medications   Prior to Admission medications    Medication Sig Start Date End Date Taking? Authorizing Provider  ALPRAZolam Prudy Feeler) 1 MG tablet Take 1 mg by mouth 4 (four) times daily as needed for anxiety.   Yes Historical Provider, MD  amphetamine-dextroamphetamine (ADDERALL XR) 30 MG 24 hr capsule Take 30 mg by mouth daily.   Yes Historical Provider, MD  ibuprofen (ADVIL,MOTRIN) 800 MG tablet Take 800 mg by mouth every 8 (eight) hours as needed for mild pain.   Yes Historical Provider, MD  PARoxetine (PAXIL) 20 MG tablet Take 20 mg by mouth daily.   Yes Historical Provider, MD  risperiDONE (RISPERDAL) 3 MG tablet Take 3-6 mg by mouth 2 (two) times daily.  at bedtime and  in the morning   Yes Historical Provider, MD  ibuprofen (ADVIL,MOTRIN) 800 MG tablet Take 1 tablet (800 mg total) by mouth 3 (three) times daily. 10/22/15   Theodore Mulders, MD  oxyCODONE-acetaminophen (PERCOCET) 5-325 MG tablet Take 1 tablet by mouth every 6 (six) hours as needed for severe pain. Patient not taking: Reported on 09/17/2015 09/08/15   Mancel Bale, MD   BP 131/80 mmHg  Pulse 69  Temp(Src) 98.3 F (36.8 C) (Oral)  Resp 16  Ht 6' (1.829 m)  Wt 77.111 kg  BMI 23.05 kg/m2  SpO2 100% Physical Exam  Constitutional: He appears well-developed and well-nourished.  HENT:  Head: Normocephalic.  Mouth/Throat: Oropharynx is clear and moist.  Eyes: Conjunctivae and EOM are normal. Pupils are equal, round, and reactive to light. No scleral icterus.  Cardiovascular: Normal rate, regular rhythm and normal heart sounds.  Exam reveals no gallop and no friction rub.   No murmur heard. Pulmonary/Chest: Effort normal and breath sounds normal. No respiratory distress. He has no wheezes. He has no rales.  Abdominal: Soft. Bowel sounds are normal. He exhibits no distension. There is no tenderness.  Musculoskeletal: Normal range of motion. He exhibits edema.  No swelling to bilateral ankles. Slight effusion to the right knee. Right kneecap is in the proper place and  has grinding with movement. Joint line normal. No appreciable erythema.   Neurological: He is alert.  Skin: Skin is warm and dry. No erythema.  Psychiatric: He has a normal mood and affect. His behavior is normal.  Nursing note and vitals reviewed.   ED Course  Procedures (including critical care time) DIAGNOSTIC STUDIES: Oxygen Saturation is 100% on RA, normal by my interpretation.    COORDINATION OF CARE: 9:53 AM Discussed treatment plan with pt at bedside which includes XR and pt agreed to plan.   Imaging Review Dg Knee Complete 4 Views Right  10/22/2015  CLINICAL DATA:  Posterior right knee pain worse when trying to bear weight since Friday appear felt a pop while walking down stairs. Initial encounter. EXAM: RIGHT KNEE - COMPLETE 4+ VIEW COMPARISON:  09/27/2011 FINDINGS: No evidence of fracture, dislocation, or joint effusion. No evidence of arthropathy or other focal bone abnormality. Soft tissues are unremarkable. IMPRESSION: Negative. Electronically Signed   By: Kennith Center M.D.   On: 10/22/2015  10:16   I have personally reviewed and evaluated these images as part of my medical decision-making.   MDM   Final diagnoses:  Right knee pain     Care plan noted. Patient's x-rays of the right knee without any significant findings. Patient retrieve a knee immobilizer followed orthopedics and 800 mg of Motrin. Patient is able to take Motrin has been currently taking Motrin.   I personally performed the services described in this documentation, which was scribed in my presence. The recorded information has been reviewed and is accurate.     Theodore MuldersScott Patience Nuzzo, MD 10/22/15 1057  Theodore MuldersScott Marnell Mcdaniel, MD 10/22/15 1057

## 2016-03-23 ENCOUNTER — Telehealth: Payer: Self-pay | Admitting: Family Medicine

## 2016-03-23 ENCOUNTER — Telehealth: Payer: Self-pay

## 2016-03-23 DIAGNOSIS — M549 Dorsalgia, unspecified: Secondary | ICD-10-CM

## 2016-03-23 DIAGNOSIS — F1911 Other psychoactive substance abuse, in remission: Secondary | ICD-10-CM

## 2016-03-23 DIAGNOSIS — G8929 Other chronic pain: Secondary | ICD-10-CM

## 2016-03-23 NOTE — Telephone Encounter (Signed)
Ok with referral, pt with Hx of chronic pain and drug abuse. Previously on suboxone.   Murtis SinkSam Bradshaw, MD [facility] 03/23/2016, 12:19 PM

## 2016-03-23 NOTE — Telephone Encounter (Signed)
See similar phone note  Murtis SinkSam Bradshaw, MD [facility] 03/23/2016, 12:21 PM

## 2016-05-04 ENCOUNTER — Telehealth: Payer: Self-pay

## 2016-05-04 NOTE — Telephone Encounter (Signed)
Per referrals coordinator RN:  Patient dismissed from [facility] pain Non compliant urine screen, disruptive behavior , foul language and possibility of diversion (Routing comment)   Appreciate FYI  Murtis SinkSam Harun Brumley, MD [facility] 05/04/2016, 5:07 PM

## 2016-09-27 ENCOUNTER — Encounter (HOSPITAL_COMMUNITY): Payer: Self-pay | Admitting: *Deleted

## 2016-09-27 ENCOUNTER — Emergency Department (HOSPITAL_COMMUNITY): Payer: Medicaid Other

## 2016-09-27 ENCOUNTER — Observation Stay (HOSPITAL_COMMUNITY)
Admission: EM | Admit: 2016-09-27 | Discharge: 2016-09-28 | Disposition: A | Discharge status: Home / Self Care | Payer: Medicaid Other | Attending: Internal Medicine | Admitting: Internal Medicine

## 2016-09-27 DIAGNOSIS — R079 Chest pain, unspecified: Secondary | ICD-10-CM | POA: Diagnosis not present

## 2016-09-27 DIAGNOSIS — M545 Low back pain: Secondary | ICD-10-CM

## 2016-09-27 DIAGNOSIS — R0789 Other chest pain: Secondary | ICD-10-CM | POA: Diagnosis not present

## 2016-09-27 DIAGNOSIS — I10 Essential (primary) hypertension: Secondary | ICD-10-CM | POA: Insufficient documentation

## 2016-09-27 DIAGNOSIS — Z79899 Other long term (current) drug therapy: Secondary | ICD-10-CM | POA: Insufficient documentation

## 2016-09-27 DIAGNOSIS — F1911 Other psychoactive substance abuse, in remission: Secondary | ICD-10-CM

## 2016-09-27 DIAGNOSIS — F17210 Nicotine dependence, cigarettes, uncomplicated: Secondary | ICD-10-CM | POA: Diagnosis not present

## 2016-09-27 DIAGNOSIS — G8929 Other chronic pain: Secondary | ICD-10-CM | POA: Diagnosis not present

## 2016-09-27 DIAGNOSIS — F39 Unspecified mood [affective] disorder: Secondary | ICD-10-CM | POA: Diagnosis present

## 2016-09-27 DIAGNOSIS — F419 Anxiety disorder, unspecified: Secondary | ICD-10-CM | POA: Diagnosis not present

## 2016-09-27 DIAGNOSIS — M549 Dorsalgia, unspecified: Secondary | ICD-10-CM

## 2016-09-27 LAB — HEPATIC FUNCTION PANEL
ALBUMIN: 4.8 g/dL (ref 3.5–5.0)
ALK PHOS: 109 U/L (ref 38–126)
ALT: 50 U/L (ref 17–63)
AST: 45 U/L — AB (ref 15–41)
Bilirubin, Direct: 0.2 mg/dL (ref 0.1–0.5)
Indirect Bilirubin: 0.7 mg/dL (ref 0.3–0.9)
Total Bilirubin: 0.9 mg/dL (ref 0.3–1.2)
Total Protein: 8.1 g/dL (ref 6.5–8.1)

## 2016-09-27 LAB — BASIC METABOLIC PANEL
BUN: 9 mg/dL (ref 6–20)
CALCIUM: 10.4 mg/dL — AB (ref 8.9–10.3)
CO2: 15 mmol/L — AB (ref 22–32)
CREATININE: 1.14 mg/dL (ref 0.61–1.24)
Chloride: 104 mmol/L (ref 101–111)
GFR calc Af Amer: >60 mL/min (ref >60)
GFR calc non Af Amer: >60 mL/min (ref >60)
Glucose, Bld: 161 mg/dL — ABNORMAL HIGH (ref 65–99)
Potassium: 3.5 mmol/L (ref 3.5–5.1)
SODIUM: 140 mmol/L (ref 135–145)

## 2016-09-27 LAB — RAPID URINE DRUG SCREEN, HOSP PERFORMED
AMPHETAMINES: POSITIVE — AB
BENZODIAZEPINES: POSITIVE — AB
Barbiturates: NOT DETECTED
COCAINE: POSITIVE — AB
Opiates: POSITIVE — AB
Tetrahydrocannabinol: POSITIVE — AB

## 2016-09-27 LAB — CBC
HCT: 44.6 % (ref 39.0–52.0)
Hemoglobin: 16.2 g/dL (ref 13.0–17.0)
MCH: 32.1 pg (ref 26.0–34.0)
MCHC: 36.3 g/dL — AB (ref 30.0–36.0)
MCV: 88.5 fL (ref 78.0–100.0)
PLATELETS: 428 10*3/uL — AB (ref 150–400)
RBC: 5.04 MIL/uL (ref 4.22–5.81)
RDW: 12.8 % (ref 11.5–15.5)
WBC: 9.9 10*3/uL (ref 4.0–10.5)

## 2016-09-27 LAB — URINALYSIS, ROUTINE W REFLEX MICROSCOPIC
Bilirubin Urine: NEGATIVE
Glucose, UA: NEGATIVE mg/dL
Hgb urine dipstick: NEGATIVE
Ketones, ur: 20 mg/dL — AB
LEUKOCYTES UA: NEGATIVE
NITRITE: NEGATIVE
PH: 5 (ref 5.0–8.0)
Protein, ur: NEGATIVE mg/dL

## 2016-09-27 LAB — I-STAT TROPONIN, ED
TROPONIN I, POC: 0.01 ng/mL (ref 0.00–0.08)
Troponin i, poc: 0 ng/mL (ref 0.00–0.08)

## 2016-09-27 LAB — LIPASE, BLOOD: Lipase: 23 U/L (ref 11–51)

## 2016-09-27 MED ORDER — SODIUM CHLORIDE 0.9 % IV SOLN
INTRAVENOUS | Status: DC
  Administered 2016-09-27 – 2016-09-28 (×2): via INTRAVENOUS

## 2016-09-27 MED ORDER — IOPAMIDOL (ISOVUE-370) INJECTION 76%
100.0000 mL | Freq: Once | INTRAVENOUS | Status: AC | PRN
  Administered 2016-09-27: 100 mL via INTRAVENOUS

## 2016-09-27 MED ORDER — HYDROMORPHONE HCL 1 MG/ML IJ SOLN
1.0000 mg | INTRAMUSCULAR | Status: DC | PRN
  Administered 2016-09-28 (×2): 1 mg via INTRAVENOUS
  Filled 2016-09-27 (×3): qty 1

## 2016-09-27 MED ORDER — SODIUM CHLORIDE 0.9 % IV BOLUS (SEPSIS)
1000.0000 mL | Freq: Once | INTRAVENOUS | Status: AC
  Administered 2016-09-27: 1000 mL via INTRAVENOUS

## 2016-09-27 MED ORDER — HYDROMORPHONE HCL 1 MG/ML IJ SOLN
1.0000 mg | Freq: Once | INTRAMUSCULAR | Status: AC
  Administered 2016-09-27: 1 mg via INTRAVENOUS
  Filled 2016-09-27: qty 1

## 2016-09-27 NOTE — ED Notes (Signed)
Pt asked RN for suboxone, states that he ran out yesterday and missed today's dose, explained to pt that RN could not give him anything for pain until seen by EDP and that if pt had this happen before when he ran out of suboxone, pt's girlfriend at bedside becomes upset states " this is not what happens when he runs out of his medication, I have been with him 7years, RN explained to family that we ask questions to try to figure out what is going on, family states " I cant deal with yalls' attitudes and need to leave this place" family left the room, security was outside pt's room during entire conversation,

## 2016-09-27 NOTE — H&P (Signed)
History and Physical    [name]:[id number] DOB: 1975-10-25 DOA: 09/27/2016  PCP: Elenora Gamma, MD  Patient coming from: Home.    Chief Complaint:  Atypical chest pain.   HPI: Theodore Crawford is an 41 y.o. male with history allergy, anxiety, Bipolar disorder, prior narcotic abuse, currently on Suboxone, missed one dose today, rare THC use, presented to the ER with retrosternal CP.  He also has diaphoresis, but no nausea, or SOB.  He has no prior exertional chest pain.  Work up in the ER showed EKG with non specific ST T changes, and initial troponin was negative.  His CXR was clear.  CT of the chest was done, showing no dissection and no evidence of PNA.  He was given IV Dilaudid, felt a little better, and hospitalist was asked to admit him for cardiac r/out.   ED Course:  See above.  Rewiew of Systems:  Constitutional: Negative for malaise, fever and chills. No significant weight loss or weight gain Eyes: Negative for eye pain, redness and discharge, diplopia, visual changes, or flashes of light. ENMT: Negative for ear pain, hoarseness, nasal congestion, sinus pressure and sore throat. No headaches; tinnitus, drooling, or problem swallowing. Cardiovascular: Negative for chest pain, palpitations, diaphoresis, dyspnea and peripheral edema. ; No orthopnea, PND Respiratory: Negative for cough, hemoptysis, wheezing and stridor. No pleuritic chestpain. Gastrointestinal: Negative for diarrhea, constipation,  melena, blood in stool, hematemesis, jaundice and rectal bleeding.    Genitourinary: Negative for frequency, dysuria, incontinence,flank pain and hematuria; Musculoskeletal: Negative for back pain and neck pain. Negative for swelling and trauma.;  Skin: . Negative for pruritus, rash, abrasions, bruising and skin lesion.; ulcerations Neuro: Negative for headache, lightheadedness and neck stiffness. Negative for weakness, altered level of consciousness , altered mental status,  extremity weakness, burning feet, involuntary movement, seizure and syncope.  Psych: negative for anxiety, depression, insomnia, tearfulness, panic attacks, hallucinations, paranoia, suicidal or homicidal ideation    Past Medical History:  Diagnosis Date  . Allergy   . Anxiety   . Anxiety disorder   . Back pain   . Bipolar 1 disorder (HCC)   . Depression   . Hypertension   . IVDU (intravenous drug user)   . Leg pain, right   . Neuromuscular disorder (HCC)   . PTSD (post-traumatic stress disorder)     Past Surgical History:  Procedure Laterality Date  . CHEST TUBE INSERTION    . CHOLECYSTECTOMY N/A 09/04/2015   Procedure: LAPAROSCOPIC CHOLECYSTECTOMY;  Surgeon: Ancil Linsey, MD;  Location: [facility];  Service: General;  Laterality: N/A;  . GALLBLADDER SURGERY  09/04/2015     reports that he has been smoking Cigarettes.  He has been smoking about 1.00 pack per day. He has never used smokeless tobacco. He reports that he does not drink alcohol or use drugs.  Allergies  Allergen Reactions  . Amoxicillin Anaphylaxis    swelling  . Bee Venom Anaphylaxis  . Penicillins Anaphylaxis    Has patient had a PCN reaction causing immediate rash, facial/tongue/throat swelling, SOB or lightheadedness with hypotension: Yes Has patient had a PCN reaction causing severe rash involving mucus membranes or skin necrosis: No Has patient had a PCN reaction that required hospitalization No Has patient had a PCN reaction occurring within the last 10 years: No If all of the above answers are "NO", then may proceed with Cephalosporin  . Naproxen Other (See Comments)    Made sick on stomach.    . Tramadol  Other (See Comments)    Made sick to stomach  . Voltaren [Diclofenac Sodium] Nausea And Vomiting    Family History  Problem Relation Age of Onset  . COPD Mother   . Heart disease Mother   . Depression Father   . Hyperlipidemia Maternal Grandmother   . Hyperlipidemia Maternal Grandfather   .  Hyperlipidemia Paternal Grandmother   . Hyperlipidemia Paternal Grandfather   . Cancer Paternal Grandfather        lung     Prior to Admission medications   Medication Sig Start Date End Date Taking? Authorizing Provider  ALPRAZolam Prudy Feeler) 1 MG tablet Take 1 mg by mouth 4 (four) times daily as needed for anxiety.   Yes [provider]  amphetamine-dextroamphetamine (ADDERALL XR) 30 MG 24 hr capsule Take 30 mg by mouth daily.   Yes [provider]  PARoxetine (PAXIL) 20 MG tablet Take 20 mg by mouth daily.   Yes [provider]  risperiDONE (RISPERDAL) 3 MG tablet Take 3-6 mg by mouth 2 (two) times daily. 6mg  at bedtime and 3mg  in the morning   Yes [provider]    Physical Exam: Vitals:   09/28/16 0000 09/28/16 0030 09/28/16 0045 09/28/16 0108  BP: 116/77 110/82  120/69  Pulse: 87 80 80 82  Resp:      Temp:    98.4 F (36.9 C)  TempSrc:    Oral  SpO2: 97% 96% 97% 99%  Weight:    (!) 178.2 kg (392 lb 13.8 oz)  Height:    6' (1.829 m)      Constitutional: NAD, calm, comfortable Vitals:   09/28/16 0000 09/28/16 0030 09/28/16 0045 09/28/16 0108  BP: 116/77 110/82  120/69  Pulse: 87 80 80 82  Resp:      Temp:    98.4 F (36.9 C)  TempSrc:    Oral  SpO2: 97% 96% 97% 99%  Weight:    (!) 178.2 kg (392 lb 13.8 oz)  Height:    6' (1.829 m)   Eyes: PERRL, lids and conjunctivae normal ENMT: Mucous membranes are moist. Posterior pharynx clear of any exudate or lesions.Normal dentition.  Neck: normal, supple, no masses, no thyromegaly Respiratory: clear to auscultation bilaterally, no wheezing, no crackles. Normal respiratory effort. No accessory muscle use.  Cardiovascular: Regular rate and rhythm, no murmurs / rubs / gallops. No extremity edema. 2+ pedal pulses. No carotid bruits.  Abdomen: no tenderness, no masses palpated. No hepatosplenomegaly. Bowel sounds positive.  Musculoskeletal: no clubbing / cyanosis. No joint deformity upper and  lower extremities. Good ROM, no contractures. Normal muscle tone.  Skin: no rashes, lesions, ulcers. No induration Neurologic: CN 2-12 grossly intact. Sensation intact, DTR normal. Strength 5/5 in all 4.  Psychiatric: Normal judgment and insight. Alert and oriented x 3. Normal mood.   Labs on Admission: I have personally reviewed following labs and imaging studies CBC:  Recent Labs Lab 09/27/16 1937  WBC 9.9  HGB 16.2  HCT 44.6  MCV 88.5  PLT 428*   Basic Metabolic Panel:  Recent Labs Lab 09/27/16 1937  NA 140  K 3.5  CL 104  CO2 15*  GLUCOSE 161*  BUN 9  CREATININE 1.14  CALCIUM 10.4*   GFR: Estimated Creatinine Clearance: 146.4 mL/min (by C-G formula based on SCr of 1.14 mg/dL). Liver Function Tests:  Recent Labs Lab 09/27/16 1937  AST 45*  ALT 50  ALKPHOS 109  BILITOT 0.9  PROT 8.1  ALBUMIN 4.8  Recent Labs Lab 09/27/16 1937  LIPASE 23   Urine analysis:    Component Value Date/Time   COLORURINE YELLOW 09/27/2016 2217   APPEARANCEUR CLEAR 09/27/2016 2217   LABSPEC >1.046 (H) 09/27/2016 2217   PHURINE 5.0 09/27/2016 2217   GLUCOSEU NEGATIVE 09/27/2016 2217   HGBUR NEGATIVE 09/27/2016 2217   BILIRUBINUR NEGATIVE 09/27/2016 2217   KETONESUR 20 (A) 09/27/2016 2217   PROTEINUR NEGATIVE 09/27/2016 2217   NITRITE NEGATIVE 09/27/2016 2217   LEUKOCYTESUR NEGATIVE 09/27/2016 2217   Radiological Exams on Admission: Dg Chest 2 View  Result Date: 09/27/2016 CLINICAL DATA:  Epigastric pain. EXAM: CHEST  2 VIEW COMPARISON:  None. FINDINGS: The heart size and mediastinal contours are within normal limits. Both lungs are clear. The visualized skeletal structures are unremarkable. IMPRESSION: No active cardiopulmonary disease. Electronically Signed   By: Gerome Sam III M.D   On: 09/27/2016 20:34   Ct Angio Chest/abd/pel For Dissection W And/or Wo Contrast  Result Date: 09/27/2016 CLINICAL DATA:  Epigastric chest and abdominal pain with diaphoresis and  dizziness EXAM: CT ANGIOGRAPHY CHEST, ABDOMEN AND PELVIS TECHNIQUE: Multidetector CT imaging through the chest, abdomen and pelvis was performed using the standard protocol during bolus administration of intravenous contrast. Multiplanar reconstructed images and MIPs were obtained and reviewed to evaluate the vascular anatomy. CONTRAST:  100 mL Isovue 370 intravenous COMPARISON:  Chest x-ray 09/27/2016, CT 08/02/2015, CT chest 01/15/2008 FINDINGS: CTA CHEST FINDINGS Cardiovascular: Non contrasted images demonstrate no evidence for intramural hematoma. No aneurysmal dilatation of the aorta. No dissection is seen. Common origin of the brachiocephalic and left common carotid vessels with slightly ectatic brachiocephalic vessel as before. Mild atherosclerotic calcifications. Normal heart size. No pericardial effusion. Mediastinum/Nodes: Calcified sub- carinal nodes. Normal esophagus. Stable prominent right paratracheal lymph node measuring up to 1 cm. No hilar adenopathy. Midline trachea. No thyroid mass. Lungs/Pleura: Stable calcified granuloma in the right lower lobe. No acute infiltrate or effusion. Musculoskeletal: No chest wall abnormality. No acute or significant osseous findings. Review of the MIP images confirms the above findings. CTA ABDOMEN AND PELVIS FINDINGS VASCULAR Aorta: Normal caliber aorta without aneurysm, dissection, vasculitis or significant stenosis. Celiac: Patent without evidence of aneurysm, dissection, vasculitis or significant stenosis. SMA: Patent without evidence of aneurysm, dissection, vasculitis or significant stenosis. Renals: Both renal arteries are patent without evidence of aneurysm, dissection, vasculitis, fibromuscular dysplasia or significant stenosis. IMA: Patent without evidence of aneurysm, dissection, vasculitis or significant stenosis. Inflow: Patent without evidence of aneurysm, dissection, vasculitis or significant stenosis. Veins: No obvious venous abnormality within the  limitations of this arterial phase study. Review of the MIP images confirms the above findings. NON-VASCULAR Hepatobiliary: Fatty liver with probable focal fat infiltration near the falciform ligament. Surgical clips in the gallbladder fossa. No biliary dilatation Pancreas: Unremarkable. No pancreatic ductal dilatation or surrounding inflammatory changes. Spleen: Normal in size without focal abnormality. Adrenals/Urinary Tract: Adrenal glands are within normal limits. Stable exophytic cyst mid left kidney. Bladder normal Stomach/Bowel: Stomach is within normal limits. Appendix appears normal. No evidence of bowel wall thickening, distention, or inflammatory changes. Lymphatic: No significantly enlarged lymph nodes Reproductive: Prostate is unremarkable. Other: No free air or free fluid. Musculoskeletal: No acute or significant osseous findings. Review of the MIP images confirms the above findings. IMPRESSION: 1. Negative for aortic aneurysm or dissection. 2. No significant vascular disease of the visceral arteries. 3. Fatty infiltration of the liver.  Interval cholecystectomy 4. Findings consistent with prior granulomatous disease Electronically Signed   By: Selena Batten  Jake SamplesFujinaga M.D.   On: 09/27/2016 21:23    EKG: Independently reviewed.  Assessment/Plan Principal Problem:   Atypical chest pain Active Problems:   Anxiety   Chronic back pain   Mood disorder (HCC)   History of drug abuse    PLAN:   Atypical CP:  Doubt ACS.  Will admit for cardiac r/out.   Give ASA, start statin, and obtain cardiac ECHO.   It is possible though unlikely that he has mild withdrawal from his suboxone.  He is stable.  Continue his home meds.   Anxiety:  Continue with his benzodiapines.   DVT prophylaxis: SubQ Heparin.  Code Status: FULL CODE Family Communication: Step son at bedside.  Disposition Plan: Home.  Consults called: None.  Admission status:  OBS.    Wilbern Pennypacker MD FACP. [facility]  If 7PM-7AM,  please contact night-coverage [email or web address] Password [email or web address]  09/28/2016, 2:41 AM

## 2016-09-27 NOTE — ED Triage Notes (Addendum)
[  pt c/o epigastric chest pain/abd pain that started this evening with diaphoresis and dizziness. Pt was given one SL nitro with improvement in pain, pt was also given 324 mg aspirin by ems,

## 2016-09-27 NOTE — ED Notes (Signed)
Pt given urinal, aware of DO 

## 2016-09-27 NOTE — ED Provider Notes (Signed)
[facility] Provider Note   CSN: [id number] Arrival date & time: 09/27/16  1927     History   Chief Complaint Chief Complaint  Patient presents with  . Chest Pain    HPI Theodore Crawford is a 41 y.o. male.  HPI Patient presents to the emergency room for evaluation of chest pain. The pain radiates from his epigastric region and goes up towards his chest. It also radiates to the right arm. Patient is also feeling diaphoretic and lightheaded. He is feeling short of breath and weak. It is severe. He denies any history of heart disease. He does smoke and he thinks his father had heart disease. He denies any history of pulmonary embolism.  Patient has a history of substance abuse disorder. He is currently taking Suboxone. Patient ran out and did not take his dose today but he does not think that is related to his pain. Past Medical History:  Diagnosis Date  . Allergy   . Anxiety   . Anxiety disorder   . Back pain   . Bipolar 1 disorder (HCC)   . Depression   . Hypertension   . IVDU (intravenous drug user)   . Leg pain, right   . Neuromuscular disorder (HCC)   . PTSD (post-traumatic stress disorder)     Patient Active Problem List   Diagnosis Date Noted  . History of drug abuse 09/17/2015  . Gallstone 08/06/2015  . Mood disorder (HCC) 05/30/2015  . Chronic back pain   . Depression 03/30/2015  . Anxiety 03/30/2015  . Lumbar herniated disc 05/01/2014  . Benzodiazepine abuse 06/04/2011    Past Surgical History:  Procedure Laterality Date  . CHEST TUBE INSERTION    . CHOLECYSTECTOMY N/A 09/04/2015   Procedure: LAPAROSCOPIC CHOLECYSTECTOMY;  Surgeon: Ancil Linsey, MD;  Location: [facility];  Service: General;  Laterality: N/A;  . GALLBLADDER SURGERY  09/04/2015       Home Medications    Prior to Admission medications   Medication Sig Start Date End Date Taking? Authorizing Provider  ALPRAZolam Prudy Feeler) 1 MG tablet Take 1 mg by mouth 4 (four) times daily as  needed for anxiety.   Yes [provider]  amphetamine-dextroamphetamine (ADDERALL XR) 30 MG 24 hr capsule Take 30 mg by mouth daily.   Yes [provider]  PARoxetine (PAXIL) 20 MG tablet Take 20 mg by mouth daily.   Yes [provider]  risperiDONE (RISPERDAL) 3 MG tablet Take 3-6 mg by mouth 2 (two) times daily. 6mg  at bedtime and 3mg  in the morning   Yes [provider]    Family History Family History  Problem Relation Age of Onset  . COPD Mother   . Heart disease Mother   . Depression Father   . Hyperlipidemia Maternal Grandmother   . Hyperlipidemia Maternal Grandfather   . Hyperlipidemia Paternal Grandmother   . Hyperlipidemia Paternal Grandfather   . Cancer Paternal Grandfather        lung    Social History Social History  Substance Use Topics  . Smoking status: Current Every Day Smoker    Packs/day: 1.00    Types: Cigarettes  . Smokeless tobacco: Never Used  . Alcohol use No     Comment: occ. use     Allergies   Amoxicillin; Bee venom; Penicillins; Naproxen; Tramadol; and Voltaren [diclofenac sodium]   Review of Systems Review of Systems  All other systems reviewed and are negative.    Physical Exam Updated Vital Signs BP  130/88   Pulse (!) 102   Temp (!) 94 F (34.4 C)   Resp 16   Ht 1.829 m (6')   Wt 86.2 kg (190 lb)   SpO2 100%   BMI 25.77 kg/m   Physical Exam  Constitutional: He appears well-developed and well-nourished. He appears distressed.  HENT:  Head: Normocephalic and atraumatic.  Right Ear: External ear normal.  Left Ear: External ear normal.  Eyes: Conjunctivae are normal. Right eye exhibits no discharge. Left eye exhibits no discharge. No scleral icterus.  Neck: Neck supple. No tracheal deviation present.  Cardiovascular: Normal rate, regular rhythm and intact distal pulses.   Pulmonary/Chest: Effort normal and breath sounds normal. No stridor. No respiratory distress. He has no wheezes. He has  no rales.  Abdominal: Soft. Bowel sounds are normal. He exhibits no distension. There is no tenderness. There is no rebound and no guarding.  Musculoskeletal: He exhibits no edema or tenderness.  Neurological: He is alert. He has normal strength. No cranial nerve deficit (no facial droop, extraocular movements intact, no slurred speech) or sensory deficit. He exhibits normal muscle tone. He displays no seizure activity. Coordination normal.  Skin: Skin is warm. No rash noted. He is diaphoretic.  Psychiatric: He has a normal mood and affect.  Nursing note and vitals reviewed.    ED Treatments / Results  Labs (all labs ordered are listed, but only abnormal results are displayed) Labs Reviewed  BASIC METABOLIC PANEL - Abnormal; Notable for the following:       Result Value   CO2 15 (*)    Glucose, Bld 161 (*)    Calcium 10.4 (*)    All other components within normal limits  CBC - Abnormal; Notable for the following:    MCHC 36.3 (*)    Platelets 428 (*)    All other components within normal limits  HEPATIC FUNCTION PANEL - Abnormal; Notable for the following:    AST 45 (*)    All other components within normal limits  LIPASE, BLOOD  URINALYSIS, ROUTINE W REFLEX MICROSCOPIC  RAPID URINE DRUG SCREEN, HOSP PERFORMED  I-STAT TROPOININ, ED  I-STAT TROPOININ, ED  I-STAT TROPOININ, ED    EKG  EKG Interpretation  Date/Time:  Sunday September 27 2016 20:56:08 EDT Ventricular Rate:  110 PR Interval:    QRS Duration: 95 QT Interval:  345 QTC Calculation: 467 R Axis:   92 Text Interpretation:  Sinus tachycardia Inferolateral infarct, old t wave changes since prior ECG, ?st elevation inferior leads Confirmed by Linwood Dibbles [phone]) on 09/27/2016 9:07:42 PM       Radiology Dg Chest 2 View  Result Date: 09/27/2016 CLINICAL DATA:  Epigastric pain. EXAM: CHEST  2 VIEW COMPARISON:  None. FINDINGS: The heart size and mediastinal contours are within normal limits. Both lungs are clear. The  visualized skeletal structures are unremarkable. IMPRESSION: No active cardiopulmonary disease. Electronically Signed   By: Gerome Sam III M.D   On: 09/27/2016 20:34   Ct Angio Chest/abd/pel For Dissection W And/or Wo Contrast  Result Date: 09/27/2016 CLINICAL DATA:  Epigastric chest and abdominal pain with diaphoresis and dizziness EXAM: CT ANGIOGRAPHY CHEST, ABDOMEN AND PELVIS TECHNIQUE: Multidetector CT imaging through the chest, abdomen and pelvis was performed using the standard protocol during bolus administration of intravenous contrast. Multiplanar reconstructed images and MIPs were obtained and reviewed to evaluate the vascular anatomy. CONTRAST:  100 mL Isovue 370 intravenous COMPARISON:  Chest x-ray 09/27/2016, CT 08/02/2015, CT chest 01/15/2008 FINDINGS: CTA  CHEST FINDINGS Cardiovascular: Non contrasted images demonstrate no evidence for intramural hematoma. No aneurysmal dilatation of the aorta. No dissection is seen. Common origin of the brachiocephalic and left common carotid vessels with slightly ectatic brachiocephalic vessel as before. Mild atherosclerotic calcifications. Normal heart size. No pericardial effusion. Mediastinum/Nodes: Calcified sub- carinal nodes. Normal esophagus. Stable prominent right paratracheal lymph node measuring up to 1 cm. No hilar adenopathy. Midline trachea. No thyroid mass. Lungs/Pleura: Stable calcified granuloma in the right lower lobe. No acute infiltrate or effusion. Musculoskeletal: No chest wall abnormality. No acute or significant osseous findings. Review of the MIP images confirms the above findings. CTA ABDOMEN AND PELVIS FINDINGS VASCULAR Aorta: Normal caliber aorta without aneurysm, dissection, vasculitis or significant stenosis. Celiac: Patent without evidence of aneurysm, dissection, vasculitis or significant stenosis. SMA: Patent without evidence of aneurysm, dissection, vasculitis or significant stenosis. Renals: Both renal arteries are patent  without evidence of aneurysm, dissection, vasculitis, fibromuscular dysplasia or significant stenosis. IMA: Patent without evidence of aneurysm, dissection, vasculitis or significant stenosis. Inflow: Patent without evidence of aneurysm, dissection, vasculitis or significant stenosis. Veins: No obvious venous abnormality within the limitations of this arterial phase study. Review of the MIP images confirms the above findings. NON-VASCULAR Hepatobiliary: Fatty liver with probable focal fat infiltration near the falciform ligament. Surgical clips in the gallbladder fossa. No biliary dilatation Pancreas: Unremarkable. No pancreatic ductal dilatation or surrounding inflammatory changes. Spleen: Normal in size without focal abnormality. Adrenals/Urinary Tract: Adrenal glands are within normal limits. Stable exophytic cyst mid left kidney. Bladder normal Stomach/Bowel: Stomach is within normal limits. Appendix appears normal. No evidence of bowel wall thickening, distention, or inflammatory changes. Lymphatic: No significantly enlarged lymph nodes Reproductive: Prostate is unremarkable. Other: No free air or free fluid. Musculoskeletal: No acute or significant osseous findings. Review of the MIP images confirms the above findings. IMPRESSION: 1. Negative for aortic aneurysm or dissection. 2. No significant vascular disease of the visceral arteries. 3. Fatty infiltration of the liver.  Interval cholecystectomy 4. Findings consistent with prior granulomatous disease Electronically Signed   By: Jasmine PangKim  Fujinaga M.D.   On: 09/27/2016 21:23    Procedures Procedures (including critical care time)  Medications Ordered in ED Medications  sodium chloride 0.9 % bolus 1,000 mL (1,000 mLs Intravenous Bolus from Bag 09/27/16 2002)    And  0.9 %  sodium chloride infusion (not administered)  HYDROmorphone (DILAUDID) injection 1 mg (1 mg Intravenous Given 09/27/16 2008)  iopamidol (ISOVUE-370) 76 % injection 100 mL (100 mLs  Intravenous Contrast Given 09/27/16 2026)     Initial Impression / Assessment and Plan / ED Course  I have reviewed the triage vital signs and the nursing notes.  Pertinent labs & imaging results that were available during my care of the patient were reviewed by me and considered in my medical decision making (see chart for details).  Clinical Course as of Sep 28 2150  Sun Sep 27, 2016  2000 Pt appears diaphoretic and uncomfortable.  Will check labs.  Initial EKG does not suggest stemi. Plan on repeating.  Will ct to also evaluate for possible dissection.  [JK]  2012 Reviewed [place] database.  Pt received 9 day supply of  buprenorphin/naloxone on 6/18  [JK]  2149 Reviewed EKG with Dr Dicie BeamMcAlheny.  I did not call a code stemi.  Agrees with that based on his review of the EKG  [JK]  2150 Pt states he is pain free now.  CT scan without pe, dissection.  Initial troponin normal.  Pt does have EKG changes and risk factor of smoking and family history.   [JK]    Clinical Course User Index [JK] Linwood Dibbles, MD    Patient presented to the emergency room with sudden onset of severe chest pain. Patient appeared diaphoretic and significant pain. Troponin is normal although he does have some EKG changes. CT scan does not show any evidence of pulmonary embolism or dissection.  Considering his risk factors and the degree of pain he is experiencing now consult the medical service for overnight observation, serial cardiac enzymes.  Final Clinical Impressions(s) / ED Diagnoses   Final diagnoses:  Chest pain, unspecified type      Linwood Dibbles, MD 09/27/16 2152

## 2016-09-28 ENCOUNTER — Observation Stay (HOSPITAL_BASED_OUTPATIENT_CLINIC_OR_DEPARTMENT_OTHER): Payer: Medicaid Other

## 2016-09-28 DIAGNOSIS — R0789 Other chest pain: Secondary | ICD-10-CM

## 2016-09-28 LAB — TROPONIN I: Troponin I: <0.03 ng/mL (ref <0.03)

## 2016-09-28 LAB — ECHOCARDIOGRAM COMPLETE
HEIGHTINCHES: 72 in
Weight: 6285.76 oz

## 2016-09-28 MED ORDER — ENOXAPARIN SODIUM 40 MG/0.4ML ~~LOC~~ SOLN
40.0000 mg | SUBCUTANEOUS | Status: DC
  Filled 2016-09-28: qty 0.4

## 2016-09-28 MED ORDER — ACETAMINOPHEN 650 MG RE SUPP: 650.0000 mg | Freq: Four times a day (QID) | RECTAL | Status: DC | PRN

## 2016-09-28 MED ORDER — ONDANSETRON HCL 4 MG/2ML IJ SOLN: 4.0000 mg | Freq: Four times a day (QID) | INTRAMUSCULAR | Status: DC | PRN

## 2016-09-28 MED ORDER — ONDANSETRON HCL 4 MG PO TABS: 4.0000 mg | ORAL_TABLET | Freq: Four times a day (QID) | ORAL | Status: DC | PRN

## 2016-09-28 MED ORDER — SODIUM CHLORIDE 0.9% FLUSH: 3.0000 mL | Freq: Two times a day (BID) | INTRAVENOUS | Status: DC

## 2016-09-28 MED ORDER — ASPIRIN EC 81 MG PO TBEC
81.0000 mg | DELAYED_RELEASE_TABLET | Freq: Every day | ORAL | Status: DC
  Administered 2016-09-28: 81 mg via ORAL
  Filled 2016-09-28: qty 1

## 2016-09-28 MED ORDER — RISPERIDONE 1 MG PO TABS: 3.0000 mg | ORAL_TABLET | Freq: Two times a day (BID) | ORAL | Status: DC

## 2016-09-28 MED ORDER — RISPERIDONE 1 MG PO TABS
3.0000 mg | ORAL_TABLET | Freq: Every day | ORAL | Status: DC
  Administered 2016-09-28: 3 mg via ORAL
  Filled 2016-09-28: qty 3

## 2016-09-28 MED ORDER — ACETAMINOPHEN 325 MG PO TABS: 650.0000 mg | ORAL_TABLET | Freq: Four times a day (QID) | ORAL | Status: DC | PRN

## 2016-09-28 MED ORDER — ALPRAZOLAM 1 MG PO TABS
1.0000 mg | ORAL_TABLET | Freq: Four times a day (QID) | ORAL | Status: DC | PRN
  Administered 2016-09-28 (×2): 1 mg via ORAL
  Filled 2016-09-28 (×2): qty 1

## 2016-09-28 MED ORDER — ATORVASTATIN CALCIUM 40 MG PO TABS: 40.0000 mg | ORAL_TABLET | Freq: Every day | ORAL | Status: DC

## 2016-09-28 MED ORDER — PAROXETINE HCL 20 MG PO TABS
20.0000 mg | ORAL_TABLET | Freq: Every day | ORAL | Status: DC
  Administered 2016-09-28: 20 mg via ORAL
  Filled 2016-09-28: qty 1

## 2016-09-28 MED ORDER — RISPERIDONE 1 MG PO TABS: 6.0000 mg | ORAL_TABLET | Freq: Every day | ORAL | Status: DC

## 2016-09-28 NOTE — Care Management Note (Signed)
Case Management Note  Patient Details  Name: Theodore Crawford MRN: [id number] Date of Birth: 08-12-1975  Subjective/Objective:                  Admitted with chest pain. Chart reviewed for CM needs. Pt from home, ind with ADL's. Has PCP, transportation and insurance with drug coverage. No CM needs anticipated.   Action/Plan: Pt discharging home today with self care.   Expected Discharge Date:  09/28/16               Expected Discharge Plan:  Home/Self Care  In-House Referral:  NA  Discharge planning Services  CM Consult  Post Acute Care Choice:  NA Choice offered to:  NA  Status of Service:  Completed, signed off  Malcolm MetroChildress, Kashari Chalmers Demske, RN 09/28/2016, 2:11 PM

## 2016-09-28 NOTE — Progress Notes (Signed)
*  PRELIMINARY RESULTS* Echocardiogram 2D Echocardiogram has been performed.   Jeryl Columbialliott, Jeilyn Reznik 09/28/2016, 10:08 AM

## 2016-09-28 NOTE — Progress Notes (Signed)
Patient discharging home.  IV removed - WNL.  Reviewed DC instructions and medications.  No questions at this time.  Advised to seek medical attention for CP reoccurence.  Patient in NAD.

## 2016-09-28 NOTE — Discharge Instructions (Signed)
Chest Wall Pain °Chest wall pain is pain in or around the bones and muscles of your chest. Sometimes, an injury causes this pain. Sometimes, the cause may not be known. This pain may take several weeks or longer to get better. °Follow these instructions at home: °Pay attention to any changes in your symptoms. Take these actions to help with your pain: °· Rest as told by your doctor. °· Avoid activities that cause pain. Try not to use your chest, belly (abdominal), or side muscles to lift heavy things. °· If directed, apply ice to the painful area: °? Put ice in a plastic bag. °? Place a towel between your skin and the bag. °? Leave the ice on for 20 minutes, 2-3 times per day. °· Take over-the-counter and prescription medicines only as told by your doctor. °· Do not use tobacco products, including cigarettes, chewing tobacco, and e-cigarettes. If you need help quitting, ask your doctor. °· Keep all follow-up visits as told by your doctor. This is important. ° °Contact a doctor if: °· You have a fever. °· Your chest pain gets worse. °· You have new symptoms. °Get help right away if: °· You feel sick to your stomach (nauseous) or you throw up (vomit). °· You feel sweaty or light-headed. °· You have a cough with phlegm (sputum) or you cough up blood. °· You are short of breath. °This information is not intended to replace advice given to you by your health care provider. Make sure you discuss any questions you have with your health care provider. °Document Released: 09/09/2007 Document Revised: 08/29/2015 Document Reviewed: 06/18/2014 °Elsevier Interactive Patient Education © 2018 [employer] ° °

## 2016-09-28 NOTE — Discharge Summary (Signed)
Physician Discharge Summary  Theodore Crawford WUJ:[id number] DOB: 12-23-1975 DOA: 09/27/2016  PCP: Elenora Gamma, MD  Admit date: 09/27/2016 Discharge date: 09/28/2016  Recommendations for Outpatient Follow-up:  1. No changes in medications on discharge   Discharge Diagnoses:  Principal Problem:   Atypical chest pain Active Problems:   Anxiety   Chronic back pain   Mood disorder (HCC)   History of drug abuse    Discharge Condition: stable   Diet recommendation: as tolerated   History of present illness:   Per HPI "41 y.o. male with history allergy, anxiety, Bipolar disorder, prior narcotic abuse, currently on Suboxone, missed one dose today, rare THC use, presented to the ER with retrosternal CP.  He also has diaphoresis, but no nausea, or SOB.  He has no prior exertional chest pain.  Work up in the ER showed EKG with non specific ST T changes, and initial troponin was negative.  His CXR was clear.  CT of the chest was done, showing no dissection and no evidence of PNA.  He was given IV Dilaudid, felt a little better, and hospitalist was asked to admit him for cardiac r/out."  Hospital Course:   Principal Problem:   Atypical chest pain - Likely due to cocaine abuse  - Normal troponin levels - ECHO with normal EF - Chest pain better this am  Active Problems:   Anxiety and depression - Continue paxil    History of drug abuse  - Counseled on drug abuse  - UDS positive for opiates, cocaine, amphetamines, THC and benzos    Signed:  Manson Passey, MD  [facility] 09/28/2016, 1:59 PM  Pager #: [phone]  Time spent in minutes: more than 30 minutes  Procedures:  ECHO  Consultations:  None   Discharge Exam: Vitals:   09/28/16 0045 09/28/16 0108  BP:  120/69  Pulse: 80 82  Resp:    Temp:  98.4 F (36.9 C)   Vitals:   09/28/16 0000 09/28/16 0030 09/28/16 0045 09/28/16 0108  BP: 116/77 110/82  120/69  Pulse: 87 80 80 82  Resp:      Temp:     98.4 F (36.9 C)  TempSrc:    Oral  SpO2: 97% 96% 97% 99%  Weight:    (!) 178.2 kg (392 lb 13.8 oz)  Height:    6' (1.829 m)    General: Pt is alert, follows commands appropriately, not in acute distress Cardiovascular: Regular rate and rhythm, S1/S2 +, no murmurs Respiratory: Clear to auscultation bilaterally, no wheezing, no crackles, no rhonchi Abdominal: Soft, non tender, non distended, bowel sounds +, no guarding Extremities: no edema, no cyanosis, pulses palpable bilaterally DP and PT Neuro: Grossly nonfocal  Discharge Instructions  Discharge Instructions    Call MD for:  persistant nausea and vomiting    Complete by:  As directed    Call MD for:  severe uncontrolled pain    Complete by:  As directed    Call MD for:  temperature >100.4    Complete by:  As directed    Diet - low sodium heart healthy    Complete by:  As directed    Discharge instructions    Complete by:  As directed    ECHO showed normal ejection fraction.   Increase activity slowly    Complete by:  As directed      Allergies as of 09/28/2016      Reactions   Amoxicillin Anaphylaxis   swelling  Bee Venom Anaphylaxis   Penicillins Anaphylaxis   Has patient had a PCN reaction causing immediate rash, facial/tongue/throat swelling, SOB or lightheadedness with hypotension: Yes Has patient had a PCN reaction causing severe rash involving mucus membranes or skin necrosis: No Has patient had a PCN reaction that required hospitalization No Has patient had a PCN reaction occurring within the last 10 years: No If all of the above answers are "NO", then may proceed with Cephalosporin   Naproxen Other (See Comments)   Made sick on stomach.     Tramadol Other (See Comments)   Made sick to stomach   Voltaren [diclofenac Sodium] Nausea And Vomiting      Medication List    TAKE these medications   ALPRAZolam 1 MG tablet Commonly known as:  XANAX Take 1 mg by mouth 4 (four) times daily as needed for  anxiety.   amphetamine-dextroamphetamine 30 MG 24 hr capsule Commonly known as:  ADDERALL XR Take 30 mg by mouth daily.   PARoxetine 20 MG tablet Commonly known as:  PAXIL Take 20 mg by mouth daily.   risperiDONE 3 MG tablet Commonly known as:  RISPERDAL Take 3-6 mg by mouth 2 (two) times daily. 6mg  at bedtime and 3mg  in the morning      Follow-up Information    Elenora GammaBradshaw, Samuel L, MD. Schedule an appointment as soon as possible for a visit in 1 week(s).   Specialty:  Family Medicine Contact information: [address] [place] [address] [phone]            The results of significant diagnostics from this hospitalization (including imaging, microbiology, ancillary and laboratory) are listed below for reference.    Significant Diagnostic Studies: Dg Chest 2 View  Result Date: 09/27/2016 CLINICAL DATA:  Epigastric pain. EXAM: CHEST  2 VIEW COMPARISON:  None. FINDINGS: The heart size and mediastinal contours are within normal limits. Both lungs are clear. The visualized skeletal structures are unremarkable. IMPRESSION: No active cardiopulmonary disease. Electronically Signed   By: Gerome Samavid  Williams III M.D   On: 09/27/2016 20:34   Ct Angio Chest/abd/pel For Dissection W And/or Wo Contrast  Result Date: 09/27/2016 CLINICAL DATA:  Epigastric chest and abdominal pain with diaphoresis and dizziness EXAM: CT ANGIOGRAPHY CHEST, ABDOMEN AND PELVIS TECHNIQUE: Multidetector CT imaging through the chest, abdomen and pelvis was performed using the standard protocol during bolus administration of intravenous contrast. Multiplanar reconstructed images and MIPs were obtained and reviewed to evaluate the vascular anatomy. CONTRAST:  100 mL Isovue 370 intravenous COMPARISON:  Chest x-ray 09/27/2016, CT 08/02/2015, CT chest 01/15/2008 FINDINGS: CTA CHEST FINDINGS Cardiovascular: Non contrasted images demonstrate no evidence for intramural hematoma. No aneurysmal dilatation of the aorta. No  dissection is seen. Common origin of the brachiocephalic and left common carotid vessels with slightly ectatic brachiocephalic vessel as before. Mild atherosclerotic calcifications. Normal heart size. No pericardial effusion. Mediastinum/Nodes: Calcified sub- carinal nodes. Normal esophagus. Stable prominent right paratracheal lymph node measuring up to 1 cm. No hilar adenopathy. Midline trachea. No thyroid mass. Lungs/Pleura: Stable calcified granuloma in the right lower lobe. No acute infiltrate or effusion. Musculoskeletal: No chest wall abnormality. No acute or significant osseous findings. Review of the MIP images confirms the above findings. CTA ABDOMEN AND PELVIS FINDINGS VASCULAR Aorta: Normal caliber aorta without aneurysm, dissection, vasculitis or significant stenosis. Celiac: Patent without evidence of aneurysm, dissection, vasculitis or significant stenosis. SMA: Patent without evidence of aneurysm, dissection, vasculitis or significant stenosis. Renals: Both renal arteries are patent without  evidence of aneurysm, dissection, vasculitis, fibromuscular dysplasia or significant stenosis. IMA: Patent without evidence of aneurysm, dissection, vasculitis or significant stenosis. Inflow: Patent without evidence of aneurysm, dissection, vasculitis or significant stenosis. Veins: No obvious venous abnormality within the limitations of this arterial phase study. Review of the MIP images confirms the above findings. NON-VASCULAR Hepatobiliary: Fatty liver with probable focal fat infiltration near the falciform ligament. Surgical clips in the gallbladder fossa. No biliary dilatation Pancreas: Unremarkable. No pancreatic ductal dilatation or surrounding inflammatory changes. Spleen: Normal in size without focal abnormality. Adrenals/Urinary Tract: Adrenal glands are within normal limits. Stable exophytic cyst mid left kidney. Bladder normal Stomach/Bowel: Stomach is within normal limits. Appendix appears normal. No  evidence of bowel wall thickening, distention, or inflammatory changes. Lymphatic: No significantly enlarged lymph nodes Reproductive: Prostate is unremarkable. Other: No free air or free fluid. Musculoskeletal: No acute or significant osseous findings. Review of the MIP images confirms the above findings. IMPRESSION: 1. Negative for aortic aneurysm or dissection. 2. No significant vascular disease of the visceral arteries. 3. Fatty infiltration of the liver.  Interval cholecystectomy 4. Findings consistent with prior granulomatous disease Electronically Signed   By: Jasmine Pang M.D.   On: 09/27/2016 21:23    Microbiology: No results found for this or any previous visit (from the past 240 hour(s)).   Labs: Basic Metabolic Panel:  Recent Labs Lab 09/27/16 1937  NA 140  K 3.5  CL 104  CO2 15*  GLUCOSE 161*  BUN 9  CREATININE 1.14  CALCIUM 10.4*   Liver Function Tests:  Recent Labs Lab 09/27/16 1937  AST 45*  ALT 50  ALKPHOS 109  BILITOT 0.9  PROT 8.1  ALBUMIN 4.8    Recent Labs Lab 09/27/16 1937  LIPASE 23   No results for input(s): AMMONIA in the last 168 hours. CBC:  Recent Labs Lab 09/27/16 1937  WBC 9.9  HGB 16.2  HCT 44.6  MCV 88.5  PLT 428*   Cardiac Enzymes:  Recent Labs Lab 09/28/16 0206 09/28/16 0713  TROPONINI <0.03 <0.03   BNP: BNP (last 3 results) No results for input(s): BNP in the last 8760 hours.  ProBNP (last 3 results) No results for input(s): PROBNP in the last 8760 hours.  CBG: No results for input(s): GLUCAP in the last 168 hours.

## 2016-09-29 LAB — HIV ANTIBODY (ROUTINE TESTING W REFLEX): HIV Screen 4th Generation wRfx: NONREACTIVE

## 2016-11-30 ENCOUNTER — Other Ambulatory Visit: Payer: Self-pay | Admitting: Neurology

## 2016-11-30 DIAGNOSIS — R569 Unspecified convulsions: Secondary | ICD-10-CM

## 2016-12-11 ENCOUNTER — Ambulatory Visit (HOSPITAL_COMMUNITY): Payer: Medicaid Other

## 2016-12-11 ENCOUNTER — Telehealth: Payer: Self-pay | Admitting: *Deleted

## 2016-12-17 ENCOUNTER — Other Ambulatory Visit: Payer: Self-pay | Admitting: Neurology

## 2016-12-17 ENCOUNTER — Ambulatory Visit (HOSPITAL_COMMUNITY)
Admission: RE | Admit: 2016-12-17 | Discharge: 2016-12-17 | Disposition: A | Discharge status: Home / Self Care | Payer: Medicaid Other | Source: Ambulatory Visit | Attending: Neurology | Admitting: Neurology

## 2016-12-17 DIAGNOSIS — R569 Unspecified convulsions: Secondary | ICD-10-CM | POA: Diagnosis not present

## 2016-12-17 MED ORDER — GADOBENATE DIMEGLUMINE 529 MG/ML IV SOLN: 15.0000 mL | Freq: Once | INTRAVENOUS | Status: DC | PRN

## 2020-01-08 ENCOUNTER — Emergency Department (HOSPITAL_COMMUNITY)
Admission: EM | Admit: 2020-01-08 | Discharge: 2020-01-09 | Disposition: A | Discharge status: Home / Self Care | Payer: Medicaid Other | Attending: Emergency Medicine | Admitting: Emergency Medicine

## 2020-01-08 ENCOUNTER — Other Ambulatory Visit: Payer: Self-pay

## 2020-01-08 ENCOUNTER — Encounter (HOSPITAL_COMMUNITY): Payer: Self-pay | Admitting: *Deleted

## 2020-01-08 DIAGNOSIS — F17210 Nicotine dependence, cigarettes, uncomplicated: Secondary | ICD-10-CM | POA: Diagnosis not present

## 2020-01-08 DIAGNOSIS — I10 Essential (primary) hypertension: Secondary | ICD-10-CM | POA: Insufficient documentation

## 2020-01-08 DIAGNOSIS — F1910 Other psychoactive substance abuse, uncomplicated: Secondary | ICD-10-CM | POA: Diagnosis not present

## 2020-01-08 DIAGNOSIS — F15959 Other stimulant use, unspecified with stimulant-induced psychotic disorder, unspecified: Secondary | ICD-10-CM | POA: Insufficient documentation

## 2020-01-08 DIAGNOSIS — R443 Hallucinations, unspecified: Secondary | ICD-10-CM | POA: Insufficient documentation

## 2020-01-08 DIAGNOSIS — Z20822 Contact with and (suspected) exposure to covid-19: Secondary | ICD-10-CM | POA: Diagnosis not present

## 2020-01-08 LAB — ETHANOL: Alcohol, Ethyl (B): <10 mg/dL (ref <10)

## 2020-01-08 LAB — CBC WITH DIFFERENTIAL/PLATELET
Abs Immature Granulocytes: 0.02 10*3/uL (ref 0.00–0.07)
Basophils Absolute: 0.1 10*3/uL (ref 0.0–0.1)
Basophils Relative: 1 %
Eosinophils Absolute: 0.4 10*3/uL (ref 0.0–0.5)
Eosinophils Relative: 6 %
HCT: 45 % (ref 39.0–52.0)
Hemoglobin: 15.2 g/dL (ref 13.0–17.0)
Immature Granulocytes: 0 %
Lymphocytes Relative: 30 %
Lymphs Abs: 2.2 10*3/uL (ref 0.7–4.0)
MCH: 30.9 pg (ref 26.0–34.0)
MCHC: 33.8 g/dL (ref 30.0–36.0)
MCV: 91.5 fL (ref 80.0–100.0)
Monocytes Absolute: 0.5 10*3/uL (ref 0.1–1.0)
Monocytes Relative: 6 %
Neutro Abs: 4.3 10*3/uL (ref 1.7–7.7)
Neutrophils Relative %: 57 %
Platelets: 302 10*3/uL (ref 150–400)
RBC: 4.92 MIL/uL (ref 4.22–5.81)
RDW: 12 % (ref 11.5–15.5)
WBC: 7.5 10*3/uL (ref 4.0–10.5)
nRBC: 0 % (ref 0.0–0.2)

## 2020-01-08 LAB — RAPID URINE DRUG SCREEN, HOSP PERFORMED
Amphetamines: POSITIVE — AB
Barbiturates: NOT DETECTED
Benzodiazepines: POSITIVE — AB
Cocaine: POSITIVE — AB
Opiates: NOT DETECTED
Tetrahydrocannabinol: POSITIVE — AB

## 2020-01-08 LAB — COMPREHENSIVE METABOLIC PANEL
ALT: 22 U/L (ref 0–44)
AST: 25 U/L (ref 15–41)
Albumin: 3.7 g/dL (ref 3.5–5.0)
Alkaline Phosphatase: 76 U/L (ref 38–126)
Anion gap: 9 (ref 5–15)
BUN: 15 mg/dL (ref 6–20)
CO2: 26 mmol/L (ref 22–32)
Calcium: 9.1 mg/dL (ref 8.9–10.3)
Chloride: 101 mmol/L (ref 98–111)
Creatinine, Ser: 0.9 mg/dL (ref 0.61–1.24)
GFR calc Af Amer: >60 mL/min (ref >60)
GFR calc non Af Amer: >60 mL/min (ref >60)
Glucose, Bld: 95 mg/dL (ref 70–99)
Potassium: 3.4 mmol/L — ABNORMAL LOW (ref 3.5–5.1)
Sodium: 136 mmol/L (ref 135–145)
Total Bilirubin: 0.7 mg/dL (ref 0.3–1.2)
Total Protein: 6.7 g/dL (ref 6.5–8.1)

## 2020-01-08 LAB — RESPIRATORY PANEL BY RT PCR (FLU A&B, COVID)
Influenza A by PCR: NEGATIVE
Influenza B by PCR: NEGATIVE
SARS Coronavirus 2 by RT PCR: NEGATIVE

## 2020-01-08 MED ORDER — LORAZEPAM 1 MG PO TABS
1.0000 mg | ORAL_TABLET | Freq: Once | ORAL | Status: AC
  Administered 2020-01-08: 20:00:00 1 mg via ORAL
  Filled 2020-01-08: qty 1

## 2020-01-08 MED ORDER — ACETAMINOPHEN 325 MG PO TABS
650.0000 mg | ORAL_TABLET | Freq: Once | ORAL | Status: AC
  Administered 2020-01-08: 21:00:00 650 mg via ORAL
  Filled 2020-01-08: qty 2

## 2020-01-08 MED ORDER — LORAZEPAM 1 MG PO TABS
1.0000 mg | ORAL_TABLET | Freq: Once | ORAL | Status: AC
  Administered 2020-01-08: 1 mg via ORAL
  Filled 2020-01-08: qty 1

## 2020-01-08 MED ORDER — RISPERIDONE 1 MG PO TABS
6.0000 mg | ORAL_TABLET | Freq: Every day | ORAL | Status: DC
  Filled 2020-01-08: qty 6

## 2020-01-08 MED ORDER — ACETAMINOPHEN 325 MG PO TABS
650.0000 mg | ORAL_TABLET | Freq: Once | ORAL | Status: AC
  Administered 2020-01-09: 650 mg via ORAL
  Filled 2020-01-08: qty 2

## 2020-01-08 NOTE — ED Triage Notes (Signed)
Pt brought in by [facility] with c/o visual and auditory hallucinations, not able to sleep, drink or eat since Friday after smoking meth.

## 2020-01-08 NOTE — ED Provider Notes (Signed)
[facility] EMERGENCY DEPARTMENT Provider Note   CSN: [id number] Arrival date & time: 01/08/20  1736     History Chief Complaint  Patient presents with  . Hallucinations    Theodore Crawford is a 44 y.o. male.  Pt reports he smoked meth on Friday and has felt bad since.  Pt reports he has been hallucinating.  Pt reports he fears his son and people yelling at him.  Pt reports he has been unable to sleep or eat.  Pt denies any medical problems.  Pt requesting something to help him rest.   The history is provided by the patient. No language interpreter was used.       Past Medical History:  Diagnosis Date  . Allergy   . Anxiety   . Anxiety disorder   . Back pain   . Bipolar 1 disorder (HCC)   . Depression   . Hypertension   . IVDU (intravenous drug user)   . Leg pain, right   . Neuromuscular disorder (HCC)   . PTSD (post-traumatic stress disorder)     Patient Active Problem List   Diagnosis Date Noted  . Atypical chest pain 09/27/2016  . History of drug abuse (HCC) 09/17/2015  . Gallstone 08/06/2015  . Mood disorder (HCC) 05/30/2015  . Chronic back pain   . Depression 03/30/2015  . Anxiety 03/30/2015  . Lumbar herniated disc 05/01/2014  . Benzodiazepine abuse (HCC) 06/04/2011    Past Surgical History:  Procedure Laterality Date  . CHEST TUBE INSERTION    . CHOLECYSTECTOMY N/A 09/04/2015   Procedure: LAPAROSCOPIC CHOLECYSTECTOMY;  Surgeon: Ancil Linsey, MD;  Location: [facility];  Service: General;  Laterality: N/A;  . GALLBLADDER SURGERY  09/04/2015       Family History  Problem Relation Age of Onset  . COPD Mother   . Heart disease Mother   . Depression Father   . Hyperlipidemia Maternal Grandmother   . Hyperlipidemia Maternal Grandfather   . Hyperlipidemia Paternal Grandmother   . Hyperlipidemia Paternal Grandfather   . Cancer Paternal Grandfather        lung    Social History   Tobacco Use  . Smoking status: Current Every Day Smoker     Packs/day: 1.00    Types: Cigarettes  . Smokeless tobacco: Never Used  Substance Use Topics  . Alcohol use: No    Alcohol/week: 4.0 standard drinks    Types: 4 Cans of beer per week    Comment: occ. use  . Drug use: No    Types: Marijuana, IV    Comment: former    Home Medications Prior to Admission medications   Medication Sig Start Date End Date Taking? Authorizing Provider  ALPRAZolam Prudy Feeler) 1 MG tablet Take 1 mg by mouth 4 (four) times daily as needed for anxiety.    [provider]  amphetamine-dextroamphetamine (ADDERALL XR) 30 MG 24 hr capsule Take 30 mg by mouth daily.    [provider]  PARoxetine (PAXIL) 20 MG tablet Take 20 mg by mouth daily.    [provider]  risperiDONE (RISPERDAL) 3 MG tablet Take 3-6 mg by mouth 2 (two) times daily. 6mg  at bedtime and 3mg  in the morning    [provider]  dicyclomine (BENTYL) 20 MG tablet Take 1 tablet (20 mg total) by mouth 2 (two) times daily. 08/02/15 08/26/15  08/04/15, MD    Allergies    Amoxicillin, Bee venom, Penicillins, Naproxen, Tramadol, and Voltaren [diclofenac sodium]  Review  of Systems   Review of Systems  All other systems reviewed and are negative.   Physical Exam Updated Vital Signs BP 132/84 (BP Location: Right Arm)   Pulse 85   Temp 99.3 F (37.4 C) (Oral)   Resp 18   SpO2 95%   Physical Exam Vitals and nursing note reviewed.  Constitutional:      Appearance: He is well-developed.  HENT:     Head: Normocephalic and atraumatic.     Mouth/Throat:     Mouth: Mucous membranes are moist.  Eyes:     Conjunctiva/sclera: Conjunctivae normal.  Cardiovascular:     Rate and Rhythm: Normal rate and regular rhythm.     Heart sounds: No murmur heard.   Pulmonary:     Effort: Pulmonary effort is normal. No respiratory distress.     Breath sounds: Normal breath sounds.  Abdominal:     Palpations: Abdomen is soft.     Tenderness: There is no abdominal  tenderness.  Musculoskeletal:     Cervical back: Neck supple.  Skin:    General: Skin is warm and dry.  Neurological:     General: No focal deficit present.     Mental Status: He is alert.  Psychiatric:        Mood and Affect: Mood normal.     ED Results / Procedures / Treatments   Labs (all labs ordered are listed, but only abnormal results are displayed) Labs Reviewed  CBC WITH DIFFERENTIAL/PLATELET  COMPREHENSIVE METABOLIC PANEL  RAPID URINE DRUG SCREEN, HOSP PERFORMED  ETHANOL    EKG None  Radiology No results found.  Procedures Procedures (including critical care time)  Medications Ordered in ED Medications - No data to display  ED Course  I have reviewed the triage vital signs and the nursing notes.  Pertinent labs & imaging results that were available during my care of the patient were reviewed by me and considered in my medical decision making (see chart for details).    MDM Rules/Calculators/A&P                          MDM:  Labs ordered.  I will consult tts  Pt given ativan 1 mg po Pt's care turned over to tammy Triplett PA  Final Clinical Impression(s) / ED Diagnoses Final diagnoses:  Hallucinations  Substance abuse [facility])    Rx / DC Orders ED Discharge Orders    None       Osie Cheeks 01/08/20 1856    Terrilee Files, MD 01/09/20 1036

## 2020-01-08 NOTE — ED Provider Notes (Signed)
   Patient signed out to me by Langston Masker, PA-C at end of shift pending completion of work-up.  Patient here with reported auditory hallucinations after smoking meth 3 days ago.  Reports hearing people talking and yelling at him.  No SI or HI.  He has been given 1 mg of Ativan p.o., he is calm and cooperative at present.  Plan is TTS consult once medically clear.  He is not IVC'd at this time.  2100 patient resting comfortably.  Urine drug screen shows positive for cocaine, benzodiazepines, amphetamines, and THC.  Covid and influenza swab negative.  Chemistries and CBC are unremarkable.  Pt medically clear.    2140  TTS has been consulted, awaiting callback. Pt remains calm, resting comfortably, requesting something for anxiety.   additional po Ativan ordered.     Pauline Aus, PA-C 01/08/20 2352    Terrilee Files, MD 01/09/20 1036

## 2020-01-09 NOTE — ED Notes (Signed)
Secretary Misty Stanley received call from TTS stating pt had fell out of chair and into the floor while assessment was in progress. Upon entering the room pt was face first on the floor. Pt responded to voice and followed commands. Pt stood with minimal assist and was helped into the wheelchair and placed back into his hallway stretcher close to nurses station.

## 2020-01-09 NOTE — BHH Counselor (Signed)
TTS attempted to assess patient. Per RN unable to be awakened for assessment.

## 2020-01-09 NOTE — ED Notes (Signed)
Pt moved into a room so tts can consult with pt.

## 2020-01-09 NOTE — Discharge Instructions (Signed)
Substance Abuse Treatment Programs ° °Intensive Outpatient Programs °[facility]     °[address]      °[place], [place]                   °[phone]      ° °[facility] °[address] °[place], [place] °[phone] ° °[facility]     °(Inpatient and outpatient)     °[address]           °[phone]   ° °[facility] °[phone] (Suboxone and Methadone) ° °[address]      °[place], [place] [address]      °[phone]      ° °[address] °[place], [place] °852-3033 ° °[facility] (Outpatient/Inpatient, [facility])    °(insurance only) [phone]      °       °[facility]Groups & Residential) °[place], [place] °[phone] ° °   °[facility]     °[address]     °[place], [place]      °[phone]      ° °[facility] (for caregivers and family) °[address] °[place], [place] °336-299-4655 ° ° ° ° ° °Residential Treatment Programs °[facility]      °[address], [place], [place] [address]  °([phone]      ° °[facility] °[address] [place], [place] [address] °[phone] ° °[facility]        °336-248-8914      ° °[facility] °1-800-659-3381 ° °[facility]             °[address]                                         °[place], [place]                                                °877-615-2722 or 336-784-9470                              ° °[facility] [place] °[address] °[place], [address] °1.877.941.8954 ° °[facility]    °[address]      °[place], [place]     °[phone]      ° °[facility] °[address] °, [place] °336-285-9073 ° °[facility]   °[address]     °[place], [place] [address]     °[phone]      °Admissions: 8am-3pm M-F ° °[facility] °[address] °[place] °336-227-7417 ° °[facility]: Residential Program (90 Days)   °[place], [place]      °336-725-8389 or 800-758-6077    ° °[facility] °[place], [place] °(Walk in Hours over the weekend or by referral) ° °[facility] °[address], [place], [place] [address] °([phone] ° °Crisis Mobile: [facility]:  1-877-626-1772 (for crisis response 24 hours a day) °[facility] Hotline:      1-800-256-2452 °Outpatient Psychiatry and Counseling ° °[facility]: Mobile Crisis   Management 24 hours:  1-877-626-1772 ° °[facility] sliding scale fee and walk in schedule: M-F 8am-12pm/1pm-3pm °[address]  °[place], [place] [address] °[phone] ° °[facility] °[address] °[place], [place] [address] °[phone] ° °[facility] (Former[facility] known [facility])- new patient walk-in appointments available Monday - Friday 8am -3pm.          °[address] °[place], [place] [address] °336-676-6840 or crisis line- 336-676-6905 ° °[facility]/ Intensive Outpatient Therapy Program °[address] °[place], [place] [address] °336-832-9804 ° °[facility]                  °Crisis Services      °[phone]      °[address]     °[place], [place] [address]                ° °[facility]   °[facility] °[phone] °[address] °[place], [place] [address] ° ° °[facility]          °[address]  °[place], [place] [address]       °([phone] ° °[facility] °[address] °[place], [place] [address] °336-292-1510 ° °[facility]    °[address]    °[place], [place] [address]     °[phone]      ° °Parish McKinney, MD     °[address]     °[place] [address]     °336-282-1251     °  °[facility] °[address] °[place] [address] ° °[facility]     °[address]     °[place], [place]      °336-542-2076      ° °[facility] °Shamsher Ahluwalia, MD °[address] °[place], [place] [address] °[phone] ° °[facility]     °[address]     °[place], [place] [address]     °336-274-1237      ° °[facility] °[address] °[place], [place] [address] °336-854-4450 °Resources for Temporary Residential Assistance/Crisis Centers ° °DAY CENTERS °[facility] °M-F 8am-3pm   °[address] [place], [place] [address]   336-332-0824 °Services include: laundry, barbering, support groups, case management, phone  & computer access, showers, AA/NA mtgs, mental health/substance abuse nurse, job skills class, disability information, VA assistance, spiritual classes, etc.  ° °HOMELESS SHELTERS ° °[facility]     °[facility]   °[address], [place]     °[phone]       °       °[facility] (women and children)       °[address] °, [place] [address] °336-275-0820 °[email or web address] for application and process °Application Required ° °[facility]   °[address]    °[place] [address]     °[phone]       °             °[facility] °[address] °,  [address] °336.273.5572 °336-235-0363(schedule application appt.) °Application Required ° °[facility] (women only)    °[address]     °[place],  [address]     °336-884-1039      °  Intake starts 6pm daily °Need valid ID, SSC, & Police report °[facility] [place] °[address] °[place], [place] °336-881-5420 °Application Required ° °[facility] (men only)     °[address]      °[place], [place]     °[phone]      ° °[facility] °(Pregnant women only) °[address] °[place], [place] °336-275-0206 ° °[facility]      °[address]      °[place], [place] [address]     °[phone]      °       °[facility] °[address] °[place], [place] °336-723-1848 °90 day commitment/SA/Application process ° °[facility](men only)     °[address]     °[place], [place]     °[phone]       °Check-in at 7pm     °       °[facility] °[address] °[place], [place] [address] °336-248-6684 °Men/Women/Women and Children must be there by 7 pm ° °[facility] °[place], [place] °336-722-8721                ° °

## 2020-01-09 NOTE — ED Notes (Signed)
Patient awake and alert at this time. Provided water and washcloth to wash face. TTS contacted for patient re-assessment.

## 2020-01-09 NOTE — ED Notes (Signed)
Home medications and cigarettes returned to patient at this time.

## 2020-01-09 NOTE — ED Notes (Signed)
Patient to family room to be assessed by TTS at this time.

## 2020-01-09 NOTE — ED Provider Notes (Signed)
Cleared by psych for d/c   Eber Hong, MD 01/09/20 1754

## 2020-01-09 NOTE — ED Notes (Signed)
Attempted to get patient vital signs. Pt refused.

## 2020-01-09 NOTE — ED Notes (Signed)
Dr. Hyacinth Meeker aware that patient has been cleared by psych.

## 2020-01-09 NOTE — BH Assessment (Addendum)
Comprehensive Clinical Assessment (CCA) Note  01/09/2020 MARQUS MACPHEE [id number]   Patient is a 44 year old male presenting voluntarily to [facility] reporting hallucinations and paranoid delusions secondary to smoking meth. Patient reported Friday night he went to a party and smoked $20 worth of methamphetamine. He states he had not slept since then until he came to the ED. He reports he heard his son's voice and was paranoid people were trying to steal from him or hurt him. Patient has a had a full night of sleep since coming to the ED.  Patient denies current SI/HI/AVH. Patient has therapy and medication management services through [facility]. He states he would like to follow up with his outpatient providers.  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    ICD-10-CM   1. Hallucinations  R44.3   2. Substance abuse (HCC)  F19.10       CCA Biopsychosocial  Intake/Chief Complaint:  CCA Intake With Chief Complaint CCA Part Two Date: 01/09/20 CCA Part Two Time: 1725 Chief Complaint/Presenting Problem: NA Patient's Currently Reported Symptoms/Problems: NA Individual's Strengths: NA Individual's Preferences: NA Individual's Abilities: NA Type of Services Patient Feels Are Needed: NA Initial Clinical Notes/Concerns: NA  Mental Health Symptoms Depression:  Depression: None  Mania:  Mania: Racing thoughts, Recklessness, Irritability  Anxiety:   Anxiety: Worrying  Psychosis:  Psychosis: Delusions, Hallucinations  Trauma:  Trauma: None  Obsessions:  Obsessions: None  Compulsions:  Compulsions: None  Inattention:  Inattention: None  Hyperactivity/Impulsivity:  Hyperactivity/Impulsivity: N/A  Oppositional/Defiant Behaviors:  Oppositional/Defiant Behaviors: N/A  Emotional Irregularity:  Emotional Irregularity: None  Other Mood/Personality  Symptoms:      Mental Status Exam Appearance and self-care  Stature:  Stature: Average  Weight:  Weight: Average weight  Clothing:  Clothing: Casual  Grooming:  Grooming: Normal  Cosmetic use:  Cosmetic Use: None  Posture/gait:  Posture/Gait: Normal  Motor activity:  Motor Activity: Not Remarkable  Sensorium  Attention:  Attention: Normal  Concentration:  Concentration: Normal  Orientation:  Orientation: X5  Recall/memory:  Recall/Memory: Normal  Affect and Mood  Affect:  Affect: Appropriate  Mood:  Mood: Anxious  Relating  Eye contact:  Eye Contact: Normal  Facial expression:  Facial Expression: Anxious  Attitude toward examiner:  Attitude Toward Examiner: Cooperative  Thought and Language  Speech flow: Speech Flow: Clear and Coherent  Thought content:  Thought Content: Appropriate to Mood and Circumstances  Preoccupation:  Preoccupations: None  Hallucinations:  Hallucinations: None  Organization:     Company secretary of Knowledge:  Fund of Knowledge: Good  Intelligence:  Intelligence: Average  Abstraction:  Abstraction: Normal  Judgement:  Judgement: Fair  Dance movement psychotherapist:  Reality Testing: Realistic  Insight:  Insight: Lacking  Decision Making:  Decision Making: Impulsive  Social Functioning  Social Maturity:  Social Maturity: Irresponsible  Social Judgement:  Social Judgement: Normal  Stress  Stressors:  Stressors: Transitions  Coping Ability:  Coping Ability: Normal  Skill Deficits:  Skill Deficits: None  Supports:  Supports: Family, Friends/Service system     Religion: Religion/Spirituality Are You A Religious Person?: No  Leisure/Recreation: Leisure / Recreation Do You Have Hobbies?: No  Exercise/Diet: Exercise/Diet Do You Exercise?: No Have You Gained or Lost A Significant Amount of Weight in the Past Six Months?: No Do You Follow a Special Diet?: No Do You Have Any  Trouble Sleeping?: No   CCA Employment/Education  Employment/Work  Situation: Employment / Work Situation Employment situation: Unemployed Patient's job has been impacted by current illness: No What is the longest time patient has a held a job?: NA Where was the patient employed at that time?: NA Has patient ever been in the [employer]?: No  Education: Education Is Patient Currently Attending School?: No Last Grade Completed: 12 Did Garment/textile technologist From [employer]?: Yes Did Theme park manager?: No Did Designer, television/film set?: No Did You Have An Individualized Education Program (IIEP): No Did You Have Any Difficulty At [employer]?: No Patient's Education Has Been Impacted by Current Illness: No   CCA Family/Childhood History  Family and Relationship History: Family history Marital status: Long term relationship Long term relationship, how long?: 9 years What types of issues is patient dealing with in the relationship?: none noted Are you sexually active?: Yes What is your sexual orientation?: heterosexual Has your sexual activity been affected by drugs, alcohol, medication, or emotional stress?: no Does patient have children?: Yes How many children?: 2 How is patient's relationship with their children?: close  Childhood History:  Childhood History By whom was/is the patient raised?: Mother, Grandparents Additional childhood history information: mother in and out of life,doesn't know father Description of patient's relationship with caregiver when they were a child: stable Patient's description of current relationship with people who raised him/her: deceased How were you disciplined when you got in trouble as a child/adolescent?: NA Does patient have siblings?: Yes Number of Siblings: 1 Description of patient's current relationship with siblings: has not seen in a long time Did patient suffer any verbal/emotional/physical/sexual abuse as a child?: No Did patient suffer from severe childhood neglect?: No Has patient ever been sexually  abused/assaulted/raped as an adolescent or adult?: No Was the patient ever a victim of a crime or a disaster?: No Witnessed domestic violence?: No Has patient been affected by domestic violence as an adult?: No  Child/Adolescent Assessment:     CCA Substance Use  Alcohol/Drug Use: Alcohol / Drug Use Pain Medications: see MAR Prescriptions: see MAR Over the Counter: see MAR History of alcohol / drug use?: Yes Substance #1 Name of Substance 1: THC 1 - Age of First Use: UTA 1 - Amount (size/oz): varies 1 - Frequency: 2-3 times weekly 1 - Duration: UTA 1 - Last Use / Amount: 10/3 Substance #2 Name of Substance 2: cocaine 2 - Age of First Use: UTA 2 - Amount (size/oz): varies 2 - Frequency: UTA 2 - Duration: UTA 2 - Last Use / Amount: 10/1                     ASAM's:  Six Dimensions of Multidimensional Assessment  Dimension 1:  Acute Intoxication and/or Withdrawal Potential:      Dimension 2:  Biomedical Conditions and Complications:      Dimension 3:  Emotional, Behavioral, or Cognitive Conditions and Complications:     Dimension 4:  Readiness to Change:     Dimension 5:  Relapse, Continued use, or Continued Problem Potential:     Dimension 6:  Recovery/Living Environment:     ASAM Severity Score:    ASAM Recommended Level of Treatment:     Substance use Disorder (SUD)    Recommendations for Services/Supports/Treatments:    DSM5 Diagnoses: Patient Active Problem List   Diagnosis Date Noted  . Atypical chest pain 09/27/2016  . History of drug abuse (HCC) 09/17/2015  . Gallstone 08/06/2015  .  Mood disorder (HCC) 05/30/2015  . Chronic back pain   . Depression 03/30/2015  . Anxiety 03/30/2015  . Lumbar herniated disc 05/01/2014  . Benzodiazepine abuse (HCC) 06/04/2011    Patient Centered Plan: Patient is on the following Treatment Plan(s):   Referrals to Alternative Service(s): Referred to Alternative Service(s):   Place:   Date:   Time:     Referred to Alternative Service(s):   Place:   Date:   Time:    Referred to Alternative Service(s):   Place:   Date:   Time:    Referred to Alternative Service(s):   Place:   Date:   Time:     Celedonio Miyamoto

## 2020-01-09 NOTE — ED Notes (Signed)
Pt is talking to himself in the hallway and if he hears staff talking about something he will repeat what they say. Pt heard 2 nurses talking about another pt and he assumed it was him.

## 2020-01-09 NOTE — BH Assessment (Signed)
Clinician attempted to complete assessment, patient continued to fall alseep due to altered mental status, patient fell out of the chair. Clinician called Diplomatic Services operational officer, nursing staff and security came to assist in taking patient back to his room. Wilkie Aye, RN, informed of update. TTS assessment to be completed when patient is alert and oriented.

## 2020-01-09 NOTE — ED Notes (Signed)
Pt. Came to nursing station requesting their daily meds. Notified Pt. That there's a chance they locked them up last night. Pt. Stated that they have daily meds that they need to take. Notified pt. That we will notify the MD and get them ordered for them. Pt. Sat back down in bed and about one minute later I heard a pill bottle shaking. Pt. Had their bag of meds in the bed with them. Approached pt. And stated that we prefer to have the doctors order meds in the ED since we don't always know what is being taken and we don't want to over prescribe. I asked if I could have the meds and Pt. Stated they had already taken their muscle relaxant and gabapentin. Pt. Did not tell me they also took suboxone. Pt. Notified their nurse that they took suboxone as well. Notified charge nurse about pt. Notified pharmacy to hold pts. Medications for them.

## 2020-01-09 NOTE — BH Assessment (Signed)
Per Assunta Found, NP patient is psych cleared to follow up with outpatient. Florentina Addison, RN notified.

## 2020-04-03 ENCOUNTER — Emergency Department (HOSPITAL_COMMUNITY)
Admission: EM | Admit: 2020-04-03 | Discharge: 2020-04-03 | Disposition: A | Discharge status: Home / Self Care | Payer: Medicaid Other | Attending: Emergency Medicine | Admitting: Emergency Medicine

## 2020-04-03 ENCOUNTER — Emergency Department (HOSPITAL_COMMUNITY): Payer: Medicaid Other

## 2020-04-03 ENCOUNTER — Encounter (HOSPITAL_COMMUNITY): Payer: Self-pay | Admitting: *Deleted

## 2020-04-03 ENCOUNTER — Other Ambulatory Visit: Payer: Self-pay

## 2020-04-03 DIAGNOSIS — R0602 Shortness of breath: Secondary | ICD-10-CM | POA: Insufficient documentation

## 2020-04-03 DIAGNOSIS — R079 Chest pain, unspecified: Secondary | ICD-10-CM | POA: Insufficient documentation

## 2020-04-03 DIAGNOSIS — Z5321 Procedure and treatment not carried out due to patient leaving prior to being seen by health care provider: Secondary | ICD-10-CM | POA: Diagnosis not present

## 2020-04-03 NOTE — ED Triage Notes (Signed)
Pt with CP after taking a shower this morning, pt was suppose to see MD for hepatitis.  Pt c/o SOB.  (Pt did not start with heavy breathing until he was called for triage.)

## 2020-04-29 ENCOUNTER — Emergency Department (HOSPITAL_COMMUNITY): Payer: Medicaid Other

## 2020-04-29 ENCOUNTER — Other Ambulatory Visit: Payer: Self-pay

## 2020-04-29 ENCOUNTER — Emergency Department (HOSPITAL_COMMUNITY)
Admission: EM | Admit: 2020-04-29 | Discharge: 2020-04-29 | Disposition: A | Discharge status: Home / Self Care | Payer: Medicaid Other | Attending: Emergency Medicine | Admitting: Emergency Medicine

## 2020-04-29 ENCOUNTER — Encounter (HOSPITAL_COMMUNITY): Payer: Self-pay

## 2020-04-29 DIAGNOSIS — N289 Disorder of kidney and ureter, unspecified: Secondary | ICD-10-CM | POA: Insufficient documentation

## 2020-04-29 DIAGNOSIS — M25511 Pain in right shoulder: Secondary | ICD-10-CM | POA: Diagnosis not present

## 2020-04-29 DIAGNOSIS — R0789 Other chest pain: Secondary | ICD-10-CM | POA: Diagnosis not present

## 2020-04-29 DIAGNOSIS — F419 Anxiety disorder, unspecified: Secondary | ICD-10-CM | POA: Insufficient documentation

## 2020-04-29 DIAGNOSIS — F17210 Nicotine dependence, cigarettes, uncomplicated: Secondary | ICD-10-CM | POA: Diagnosis not present

## 2020-04-29 DIAGNOSIS — I10 Essential (primary) hypertension: Secondary | ICD-10-CM | POA: Insufficient documentation

## 2020-04-29 DIAGNOSIS — Z79899 Other long term (current) drug therapy: Secondary | ICD-10-CM | POA: Insufficient documentation

## 2020-04-29 LAB — BASIC METABOLIC PANEL
Anion gap: 13 (ref 5–15)
BUN: 18 mg/dL (ref 6–20)
CO2: 19 mmol/L — ABNORMAL LOW (ref 22–32)
Calcium: 9.4 mg/dL (ref 8.9–10.3)
Chloride: 102 mmol/L (ref 98–111)
Creatinine, Ser: 1.28 mg/dL — ABNORMAL HIGH (ref 0.61–1.24)
GFR, Estimated: >60 mL/min (ref >60)
Glucose, Bld: 69 mg/dL — ABNORMAL LOW (ref 70–99)
Potassium: 3.8 mmol/L (ref 3.5–5.1)
Sodium: 134 mmol/L — ABNORMAL LOW (ref 135–145)

## 2020-04-29 LAB — TROPONIN I (HIGH SENSITIVITY)
Troponin I (High Sensitivity): <2 ng/L (ref <18)
Troponin I (High Sensitivity): <2 ng/L (ref <18)

## 2020-04-29 LAB — CBC
HCT: 43.5 % (ref 39.0–52.0)
Hemoglobin: 15 g/dL (ref 13.0–17.0)
MCH: 31.5 pg (ref 26.0–34.0)
MCHC: 34.5 g/dL (ref 30.0–36.0)
MCV: 91.4 fL (ref 80.0–100.0)
Platelets: 335 10*3/uL (ref 150–400)
RBC: 4.76 MIL/uL (ref 4.22–5.81)
RDW: 12.4 % (ref 11.5–15.5)
WBC: 11.5 10*3/uL — ABNORMAL HIGH (ref 4.0–10.5)
nRBC: 0 % (ref 0.0–0.2)

## 2020-04-29 LAB — CBG MONITORING, ED
Glucose-Capillary: 134 mg/dL — ABNORMAL HIGH (ref 70–99)
Glucose-Capillary: 107 mg/dL — ABNORMAL HIGH (ref 70–99)

## 2020-04-29 LAB — ETHANOL: Alcohol, Ethyl (B): <10 mg/dL (ref <10)

## 2020-04-29 MED ORDER — PREDNISONE 50 MG PO TABS
60.0000 mg | ORAL_TABLET | Freq: Once | ORAL | Status: DC
  Filled 2020-04-29: qty 1

## 2020-04-29 MED ORDER — DEXTROSE 5 % AND 0.9 % NACL IV BOLUS
1000.0000 mL | Freq: Once | INTRAVENOUS | Status: AC
  Administered 2020-04-29: 1000 mL via INTRAVENOUS

## 2020-04-29 MED ORDER — CLONAZEPAM 0.5 MG PO TABS
0.5000 mg | ORAL_TABLET | Freq: Once | ORAL | Status: AC
  Administered 2020-04-29: 0.5 mg via ORAL
  Filled 2020-04-29: qty 1

## 2020-04-29 MED ORDER — DEXTROSE-NACL 5-0.45 % IV SOLN: INTRAVENOUS | Status: DC

## 2020-04-29 MED ORDER — PREDNISONE 50 MG PO TABS: ORAL_TABLET | ORAL | 0 refills | Status: AC

## 2020-04-29 NOTE — Discharge Instructions (Addendum)
Take the prednisone for treatment of your shoulder and chest pain which I suspect may be musculoskeletal source from your fall yesterday.  Your lab tests are normal today with no sign of lung or heart problems.  You do have an elevated creatinine level which is 1.28 today (this suggests your kidney function is reduced and can be due to dehydration).  You have received IV fluids here but will benefit from further oral fluids - make sure you are drinking plenty of water/juice, hydrating fluids.  Have your primary MD recheck your creatinine within the next week to make sure this test is improving.

## 2020-04-29 NOTE — ED Notes (Signed)
Pt instructed on need for UA. Pt verbalized understanding.

## 2020-04-29 NOTE — ED Provider Notes (Signed)
[facility] EMERGENCY DEPARTMENT Provider Note   CSN: [id number] Arrival date & time: 04/29/20  1659     History Chief Complaint  Patient presents with  . Chest Pain    Theodore Crawford is a 45 y.o. male with a history of anxiety disorder, HTN, IVDU , states last used methadone 3 days ago, also smokes marijuana daily, PTSD, woke this afternoon with right sided shoulder pain which radiates into his right chest, sharp in character and worsened with movement, better when resting.  He denies shortness of breath or worsened pain with deep inspiration.  He reports tingling in his fingertips, no perioral involvement, but does feel very anxious. He takes klonopin 0.5 mg bid, did not take his dose prior to arrival (is also on suboxone).  He reports intermittent chills and diaphoresis, denies fevers, also denies cough, uri type sx, no n/v.  He was given asa 325 prior to arrival, also given ntg x 1 with mildly improved symptoms.    The history is provided by the patient.       Past Medical History:  Diagnosis Date  . Allergy   . Anxiety   . Anxiety disorder   . Back pain   . Bipolar 1 disorder (HCC)   . Depression   . Hypertension   . IVDU (intravenous drug user)   . Leg pain, right   . Neuromuscular disorder (HCC)   . PTSD (post-traumatic stress disorder)     Patient Active Problem List   Diagnosis Date Noted  . Atypical chest pain 09/27/2016  . History of drug abuse (HCC) 09/17/2015  . Gallstone 08/06/2015  . Mood disorder (HCC) 05/30/2015  . Chronic back pain   . Depression 03/30/2015  . Anxiety 03/30/2015  . Lumbar herniated disc 05/01/2014  . Benzodiazepine abuse (HCC) 06/04/2011    Past Surgical History:  Procedure Laterality Date  . CHEST TUBE INSERTION    . CHOLECYSTECTOMY N/A 09/04/2015   Procedure: LAPAROSCOPIC CHOLECYSTECTOMY;  Surgeon: Ancil Linsey, MD;  Location: [facility];  Service: General;  Laterality: N/A;  . GALLBLADDER SURGERY  09/04/2015       Family  History  Problem Relation Age of Onset  . COPD Mother   . Heart disease Mother   . Depression Father   . Hyperlipidemia Maternal Grandmother   . Hyperlipidemia Maternal Grandfather   . Hyperlipidemia Paternal Grandmother   . Hyperlipidemia Paternal Grandfather   . Cancer Paternal Grandfather        lung    Social History   Tobacco Use  . Smoking status: Current Every Day Smoker    Packs/day: 1.00    Types: Cigarettes  . Smokeless tobacco: Never Used  Vaping Use  . Vaping Use: Never used  Substance Use Topics  . Alcohol use: Yes    Alcohol/week: 4.0 standard drinks    Types: 4 Cans of beer per week    Comment: occ. use  . Drug use: Not Currently    Types: Marijuana, IV, Methamphetamines    Comment: contiues Marijuana     Home Medications Prior to Admission medications   Medication Sig Start Date End Date Taking? Authorizing Provider  predniSONE (DELTASONE) 50 MG tablet Take one tablet daily for 7 days 04/29/20  Yes Kierre Hintz, Raynelle Fanning, PA-C  ALPRAZolam Prudy Feeler) 1 MG tablet Take 1 mg by mouth 4 (four) times daily as needed for anxiety.    [provider]  amphetamine-dextroamphetamine (ADDERALL XR) 30 MG 24 hr capsule Take 30 mg by mouth  daily.    [provider]  PARoxetine (PAXIL) 20 MG tablet Take 20 mg by mouth daily.    [provider]  risperiDONE (RISPERDAL) 3 MG tablet Take 3-6 mg by mouth 2 (two) times daily. 6mg  at bedtime and 3mg  in the morning    [provider]  dicyclomine (BENTYL) 20 MG tablet Take 1 tablet (20 mg total) by mouth 2 (two) times daily. 08/02/15 08/26/15  08/04/15, MD    Allergies    Amoxicillin, Bee venom, Penicillins, Naproxen, Tramadol, and Voltaren [diclofenac sodium]  Review of Systems   Review of Systems  Constitutional: Positive for chills and diaphoresis. Negative for fever.  HENT: Negative for congestion and sore throat.   Eyes: Negative.   Respiratory: Negative for cough, chest tightness and  shortness of breath.   Cardiovascular: Positive for chest pain. Negative for palpitations.  Gastrointestinal: Negative for abdominal pain, nausea and vomiting.  Genitourinary: Negative.   Musculoskeletal: Positive for arthralgias. Negative for joint swelling and neck pain.  Skin: Negative.  Negative for rash and wound.  Neurological: Positive for numbness. Negative for dizziness, weakness, light-headedness and headaches.  Psychiatric/Behavioral: Negative.   All other systems reviewed and are negative.   Physical Exam Updated Vital Signs BP 105/74   Pulse 79   Temp 97.6 F (36.4 C) (Oral)   Resp 18   Ht 5\' 11"  (1.803 m)   Wt 92.1 kg   SpO2 98%   BMI 28.31 kg/m   Physical Exam Vitals and nursing note reviewed.  Constitutional:      Appearance: He is well-developed and well-nourished.  HENT:     Head: Normocephalic and atraumatic.  Eyes:     Conjunctiva/sclera: Conjunctivae normal.  Cardiovascular:     Rate and Rhythm: Normal rate and regular rhythm.     Pulses: Intact distal pulses.     Heart sounds: Normal heart sounds.  Pulmonary:     Effort: Pulmonary effort is normal.     Breath sounds: Normal breath sounds. No wheezing, rhonchi or rales.  Abdominal:     General: Bowel sounds are normal.     Palpations: Abdomen is soft.     Tenderness: There is no abdominal tenderness.  Musculoskeletal:        General: Normal range of motion.     Cervical back: Normal range of motion.  Skin:    General: Skin is warm and dry.  Neurological:     General: No focal deficit present.     Mental Status: He is alert.     Cranial Nerves: Cranial nerves are intact.     Sensory: Sensory deficit present.     Motor: Motor function is intact.     Comments: Decreased sensation bilateral finger tips.  Psychiatric:        Mood and Affect: Mood is anxious.     ED Results / Procedures / Treatments   Labs (all labs ordered are listed, but only abnormal results are displayed) Labs Reviewed   BASIC METABOLIC PANEL - Abnormal; Notable for the following components:      Result Value   Sodium 134 (*)    CO2 19 (*)    Glucose, Bld 69 (*)    Creatinine, Ser 1.28 (*)    All other components within normal limits  CBC - Abnormal; Notable for the following components:   WBC 11.5 (*)    All other components within normal limits  CBG MONITORING, ED - Abnormal; Notable for the following components:  Glucose-Capillary 134 (*)    All other components within normal limits  CBG MONITORING, ED - Abnormal; Notable for the following components:   Glucose-Capillary 107 (*)    All other components within normal limits  ETHANOL  TROPONIN I (HIGH SENSITIVITY)  TROPONIN I (HIGH SENSITIVITY)    EKG EKG Interpretation  Date/Time:  Monday April 29 2020 17:04:44 EST Ventricular Rate:  100 PR Interval:    QRS Duration: 97 QT Interval:  382 QTC Calculation: 493 R Axis:   81 Text Interpretation: Sinus tachycardia Inferior infarct, old Lateral leads are also involved No STEMI Confirmed by Alona Bene [phone]) on 04/29/2020 5:14:46 PM   Radiology DG Chest Portable 1 View  Result Date: 04/29/2020 CLINICAL DATA:  Chest pain EXAM: PORTABLE CHEST 1 VIEW COMPARISON:  09/27/2016 FINDINGS: The heart size and mediastinal contours are within normal limits. Both lungs are clear. The visualized skeletal structures are unremarkable. IMPRESSION: No active disease. Electronically Signed   By: Jasmine Pang M.D.   On: 04/29/2020 17:53    Procedures Procedures   Medications Ordered in ED Medications  clonazePAM (KLONOPIN) tablet 0.5 mg (0.5 mg Oral Given 04/29/20 1748)  dextrose 5 % and 0.9% NaCl 5-0.9 % bolus 1,000 mL (0 mLs Intravenous Stopped 04/29/20 2236)    ED Course  I have reviewed the triage vital signs and the nursing notes.  Pertinent labs & imaging results that were available during my care of the patient were reviewed by me and considered in my medical decision making (see chart for  details).    MDM Rules/Calculators/A&P                          Labs, ekg, imaging reviewed and discussed with pt. He refused to give urine sample, despite receiving IV fluids, also tolerated PO intake.  No chest pain at time of dispo, still with complaint of right sided shoulder pain and chills.  VS remained stable.  He does endorse walking into the frame of his bathroom door ytd, striking his right shoulder, ? If this is the source of shoulder pain. No deformity, no edema, no visible bruising. Pt has FROM of the shoulder, no indication for dedicated imaging of the joint, CXR reassuring.  Pt expressed desire for dispo despite incomplete work up.  He was given course of prednisone for suspected MSK source of pain.  Also advised elevated creatinine and need for recheck of this with pcp within one week.  Troponin negative, doubt atypical ACS, no sob, VSS, PE unlikely.  Glucose borderline low on initial bmet, given D5NS 1 L with stable cbg at time of dc.  Return precautions outlined.  Pt also presented initially very anxious, had missed klonopin dose, given here.  Much calmer at time of dc, no further c/o finger numbness, ? Panic/hyperventilation sx. Co2 was reduced per bmet. Final Clinical Impression(s) / ED Diagnoses Final diagnoses:  Atypical chest pain  Anxiety  Renal insufficiency    Rx / DC Orders ED Discharge Orders         Ordered    predniSONE (DELTASONE) 50 MG tablet        04/29/20 2322           Burgess Amor, PA-C 04/30/20 1322    Long, Arlyss Repress, MD 04/30/20 1330

## 2020-04-29 NOTE — ED Provider Notes (Incomplete)
[facility] EMERGENCY DEPARTMENT Provider Note   CSN: [id number] Arrival date & time: 04/29/20  1659     History Chief Complaint  Patient presents with  . Chest Pain    Theodore Crawford is a 45 y.o. male with a history of anxiety disorder, HTN, IVDU , states last used methadone 3 days ago, also smokes marijuana daily, PTSD, woke this afternoon with right sided shoulder pain which radiates into his right chest, sharp in character and worsened with movement, better when resting.  He denies shortness of breath or worsened pain with deep inspiration.  He reports tingling in his fingertips, no perioral involvement, but does feel very anxious. He takes klonopin 0.5 mg bid, did not take his dose prior to arrival (is also on suboxone).  He reports intermittent chills and diaphoresis, denies fevers, also denies cough, uri type sx, no n/v.  He was given asa 325 prior to arrival, also given ntg x 1 with improved symptoms.    HPI     Past Medical History:  Diagnosis Date  . Allergy   . Anxiety   . Anxiety disorder   . Back pain   . Bipolar 1 disorder (HCC)   . Depression   . Hypertension   . IVDU (intravenous drug user)   . Leg pain, right   . Neuromuscular disorder (HCC)   . PTSD (post-traumatic stress disorder)     Patient Active Problem List   Diagnosis Date Noted  . Atypical chest pain 09/27/2016  . History of drug abuse (HCC) 09/17/2015  . Gallstone 08/06/2015  . Mood disorder (HCC) 05/30/2015  . Chronic back pain   . Depression 03/30/2015  . Anxiety 03/30/2015  . Lumbar herniated disc 05/01/2014  . Benzodiazepine abuse (HCC) 06/04/2011    Past Surgical History:  Procedure Laterality Date  . CHEST TUBE INSERTION    . CHOLECYSTECTOMY N/A 09/04/2015   Procedure: LAPAROSCOPIC CHOLECYSTECTOMY;  Surgeon: Ancil Linsey, MD;  Location: [facility];  Service: General;  Laterality: N/A;  . GALLBLADDER SURGERY  09/04/2015       Family History  Problem Relation Age of Onset  .  COPD Mother   . Heart disease Mother   . Depression Father   . Hyperlipidemia Maternal Grandmother   . Hyperlipidemia Maternal Grandfather   . Hyperlipidemia Paternal Grandmother   . Hyperlipidemia Paternal Grandfather   . Cancer Paternal Grandfather        lung    Social History   Tobacco Use  . Smoking status: Current Every Day Smoker    Packs/day: 1.00    Types: Cigarettes  . Smokeless tobacco: Never Used  Vaping Use  . Vaping Use: Never used  Substance Use Topics  . Alcohol use: Yes    Alcohol/week: 4.0 standard drinks    Types: 4 Cans of beer per week    Comment: occ. use  . Drug use: Not Currently    Types: Marijuana, IV, Methamphetamines    Comment: contiues Marijuana     Home Medications Prior to Admission medications   Medication Sig Start Date End Date Taking? Authorizing Provider  ALPRAZolam Prudy Feeler) 1 MG tablet Take 1 mg by mouth 4 (four) times daily as needed for anxiety.    [provider]  amphetamine-dextroamphetamine (ADDERALL XR) 30 MG 24 hr capsule Take 30 mg by mouth daily.    [provider]  PARoxetine (PAXIL) 20 MG tablet Take 20 mg by mouth daily.    [provider]  risperiDONE (RISPERDAL)  3 MG tablet Take 3-6 mg by mouth 2 (two) times daily. 6mg  at bedtime and 3mg  in the morning    [provider]  dicyclomine (BENTYL) 20 MG tablet Take 1 tablet (20 mg total) by mouth 2 (two) times daily. 08/02/15 08/26/15  08/04/15, MD    Allergies    Amoxicillin, Bee venom, Penicillins, Naproxen, Tramadol, and Voltaren [diclofenac sodium]  Review of Systems   Review of Systems  Constitutional: Positive for chills and diaphoresis. Negative for fever.  HENT: Negative for congestion and sore throat.   Eyes: Negative.   Respiratory: Negative for cough, chest tightness and shortness of breath.   Cardiovascular: Positive for chest pain. Negative for palpitations.  Gastrointestinal: Negative for abdominal pain, nausea and  vomiting.  Genitourinary: Negative.   Musculoskeletal: Positive for arthralgias. Negative for joint swelling and neck pain.  Skin: Negative.  Negative for rash and wound.  Neurological: Positive for numbness. Negative for dizziness, weakness, light-headedness and headaches.  Psychiatric/Behavioral: Negative.   All other systems reviewed and are negative.   Physical Exam Updated Vital Signs BP 102/74   Pulse 83   Temp 97.6 F (36.4 C) (Oral)   Resp 17   Ht 5\' 11"  (1.803 m)   Wt 92.1 kg   SpO2 99%   BMI 28.31 kg/m   Physical Exam Vitals and nursing note reviewed.  Constitutional:      Appearance: He is well-developed and well-nourished.  HENT:     Head: Normocephalic and atraumatic.  Eyes:     Conjunctiva/sclera: Conjunctivae normal.  Cardiovascular:     Rate and Rhythm: Normal rate and regular rhythm.     Pulses: Intact distal pulses.     Heart sounds: Normal heart sounds.  Pulmonary:     Effort: Pulmonary effort is normal.     Breath sounds: Normal breath sounds. No wheezing, rhonchi or rales.  Abdominal:     General: Bowel sounds are normal.     Palpations: Abdomen is soft.     Tenderness: There is no abdominal tenderness.  Musculoskeletal:        General: Normal range of motion.     Cervical back: Normal range of motion.  Skin:    General: Skin is warm and dry.  Neurological:     General: No focal deficit present.     Mental Status: He is alert.     Cranial Nerves: Cranial nerves are intact.     Sensory: Sensory deficit present.     Motor: Motor function is intact.     Comments: Decreased sensation bilateral finger tips.  Psychiatric:        Mood and Affect: Mood is anxious.     ED Results / Procedures / Treatments   Labs (all labs ordered are listed, but only abnormal results are displayed) Labs Reviewed  BASIC METABOLIC PANEL - Abnormal; Notable for the following components:      Result Value   Sodium 134 (*)    CO2 19 (*)    Glucose, Bld 69 (*)     Creatinine, Ser 1.28 (*)    All other components within normal limits  CBC - Abnormal; Notable for the following components:   WBC 11.5 (*)    All other components within normal limits  CBG MONITORING, ED - Abnormal; Notable for the following components:   Glucose-Capillary 134 (*)    All other components within normal limits  CBG MONITORING, ED - Abnormal; Notable for the following components:   Glucose-Capillary 107 (*)  All other components within normal limits  ETHANOL  RAPID URINE DRUG SCREEN, HOSP PERFORMED  CBG MONITORING, ED  TROPONIN I (HIGH SENSITIVITY)  TROPONIN I (HIGH SENSITIVITY)    EKG EKG Interpretation  Date/Time:  Monday April 29 2020 17:04:44 EST Ventricular Rate:  100 PR Interval:    QRS Duration: 97 QT Interval:  382 QTC Calculation: 493 R Axis:   81 Text Interpretation: Sinus tachycardia Inferior infarct, old Lateral leads are also involved No STEMI Confirmed by Alona Bene [phone]) on 04/29/2020 5:14:46 PM   Radiology DG Chest Portable 1 View  Result Date: 04/29/2020 CLINICAL DATA:  Chest pain EXAM: PORTABLE CHEST 1 VIEW COMPARISON:  09/27/2016 FINDINGS: The heart size and mediastinal contours are within normal limits. Both lungs are clear. The visualized skeletal structures are unremarkable. IMPRESSION: No active disease. Electronically Signed   By: Jasmine Pang M.D.   On: 04/29/2020 17:53    Procedures Procedures {Remember to document critical care time when appropriate:1}  Medications Ordered in ED Medications - No data to display  ED Course  I have reviewed the triage vital signs and the nursing notes.  Pertinent labs & imaging results that were available during my care of the patient were reviewed by me and considered in my medical decision making (see chart for details).    MDM Rules/Calculators/A&P                          *** Final Clinical Impression(s) / ED Diagnoses Final diagnoses:  None    Rx / DC Orders ED  Discharge Orders    None

## 2020-04-29 NOTE — ED Notes (Signed)
Pt admitted to using meth and marijuana a couple days ago. Pt denies use today.

## 2020-04-29 NOTE — ED Notes (Signed)
Pt resting quietly in bed with eyes closed, VSS. NAD noted.

## 2020-04-29 NOTE — ED Triage Notes (Signed)
Pt presents to ED with complaints of chest pain radiating down right arm, chills, diaphoretic. Nitro x 1, Asa 325 mg given PTA.

## 2021-01-22 ENCOUNTER — Encounter (HOSPITAL_COMMUNITY): Payer: Self-pay

## 2021-01-22 ENCOUNTER — Other Ambulatory Visit: Payer: Self-pay

## 2021-01-22 ENCOUNTER — Emergency Department (HOSPITAL_COMMUNITY): Payer: Medicaid Other

## 2021-01-22 ENCOUNTER — Emergency Department (HOSPITAL_COMMUNITY)
Admission: EM | Admit: 2021-01-22 | Discharge: 2021-01-22 | Disposition: A | Discharge status: Home / Self Care | Payer: Medicaid Other | Attending: Emergency Medicine | Admitting: Emergency Medicine

## 2021-01-22 DIAGNOSIS — R2231 Localized swelling, mass and lump, right upper limb: Secondary | ICD-10-CM | POA: Diagnosis not present

## 2021-01-22 DIAGNOSIS — I10 Essential (primary) hypertension: Secondary | ICD-10-CM | POA: Diagnosis not present

## 2021-01-22 DIAGNOSIS — F17210 Nicotine dependence, cigarettes, uncomplicated: Secondary | ICD-10-CM | POA: Diagnosis not present

## 2021-01-22 MED ORDER — DOXYCYCLINE HYCLATE 100 MG PO CAPS: 100.0000 mg | ORAL_CAPSULE | Freq: Two times a day (BID) | ORAL | 0 refills | Status: AC

## 2021-01-22 NOTE — ED Triage Notes (Signed)
Pt has "knot" on posterior elbow that has been there x 2 weeks, states increased pain.

## 2021-01-22 NOTE — ED Provider Notes (Signed)
[facility] EMERGENCY DEPARTMENT Provider Note   CSN: [id number] Arrival date & time: 01/22/21  1803     History Chief Complaint  Patient presents with   Abscess    Theodore Crawford is a 45 y.o. male.  HPI  Patient with significant medical history of anxiety, bipolar, hypertension, IV drug user presents to the emergency department with chief complaint of a knot on his right elbow.  Patient states he noticed the knot approximately 3 weeks ago, it presented itself after he landed on a table.  He states the knot has gotten larger in size has become more painful.  He denies  paresthesias or weakness in his right arm, he denies associated redness around the area, drainage or discharge, he has no fevers, chills, chest pain, shortness of breath, peripheral edema, he denies recent drug use.  He states that he is never had anything like in the past, he denies any alleviating factors, he states pain is worsened with arm movements.   Past Medical History:  Diagnosis Date   Allergy    Anxiety    Anxiety disorder    Back pain    Bipolar 1 disorder (HCC)    Depression    Hypertension    IVDU (intravenous drug user)    Leg pain, right    Neuromuscular disorder (HCC)    PTSD (post-traumatic stress disorder)     Patient Active Problem List   Diagnosis Date Noted   Atypical chest pain 09/27/2016   History of drug abuse (HCC) 09/17/2015   Gallstone 08/06/2015   Mood disorder (HCC) 05/30/2015   Chronic back pain    Depression 03/30/2015   Anxiety 03/30/2015   Lumbar herniated disc 05/01/2014   Benzodiazepine abuse (HCC) 06/04/2011    Past Surgical History:  Procedure Laterality Date   CHEST TUBE INSERTION     CHOLECYSTECTOMY N/A 09/04/2015   Procedure: LAPAROSCOPIC CHOLECYSTECTOMY;  Surgeon: Ancil Linsey, MD;  Location: [facility];  Service: General;  Laterality: N/A;   GALLBLADDER SURGERY  09/04/2015       Family History  Problem Relation Age of Onset   COPD Mother    Heart  disease Mother    Depression Father    Hyperlipidemia Maternal Grandmother    Hyperlipidemia Maternal Grandfather    Hyperlipidemia Paternal Grandmother    Hyperlipidemia Paternal Grandfather    Cancer Paternal Grandfather        lung    Social History   Tobacco Use   Smoking status: Every Day    Packs/day: 1.00    Types: Cigarettes   Smokeless tobacco: Never  Vaping Use   Vaping Use: Never used  Substance Use Topics   Alcohol use: Yes    Alcohol/week: 4.0 standard drinks    Types: 4 Cans of beer per week    Comment: occ. use   Drug use: Not Currently    Types: Marijuana, IV, Methamphetamines    Comment: contiues Marijuana     Home Medications Prior to Admission medications   Medication Sig Start Date End Date Taking? Authorizing Provider  doxycycline (VIBRAMYCIN) 100 MG capsule Take 1 capsule (100 mg total) by mouth 2 (two) times daily for 7 days. 01/22/21 01/29/21 Yes Carroll Sage, PA-C  ALPRAZolam Prudy Feeler) 1 MG tablet Take 1 mg by mouth 4 (four) times daily as needed for anxiety.    [provider]  amphetamine-dextroamphetamine (ADDERALL XR) 30 MG 24 hr capsule Take 30 mg by mouth daily.    [provider]  PARoxetine (PAXIL) 20 MG tablet Take 20 mg by mouth daily.    [provider]  predniSONE (DELTASONE) 50 MG tablet Take one tablet daily for 7 days 04/29/20   Burgess Amor, PA-C  risperiDONE (RISPERDAL) 3 MG tablet Take 3-6 mg by mouth 2 (two) times daily. 6mg  at bedtime and 3mg  in the morning    [provider]  dicyclomine (BENTYL) 20 MG tablet Take 1 tablet (20 mg total) by mouth 2 (two) times daily. 08/02/15 08/26/15  08/04/15, MD    Allergies    Amoxicillin, Bee venom, Penicillins, Naproxen, Tramadol, and Voltaren [diclofenac sodium]  Review of Systems   Review of Systems  Constitutional:  Negative for chills and fever.  HENT:  Negative for congestion.   Respiratory:  Negative for shortness of breath.    Cardiovascular:  Negative for chest pain.  Gastrointestinal:  Negative for abdominal pain.  Genitourinary:  Negative for enuresis.  Musculoskeletal:  Negative for back pain.       Right elbow pain.  Skin:  Negative for rash.  Neurological:  Negative for dizziness.  Hematological:  Does not bruise/bleed easily.   Physical Exam Updated Vital Signs BP (!) 138/94 (BP Location: Right Arm)   Pulse 99   Temp 98.3 F (36.8 C) (Oral)   Resp 18   Ht 5\' 11"  (1.803 m)   Wt 73.9 kg   SpO2 98%   BMI 22.73 kg/m   Physical Exam Vitals and nursing note reviewed.  Constitutional:      General: He is not in acute distress.    Appearance: He is not ill-appearing.  HENT:     Head: Normocephalic and atraumatic.     Nose: No congestion.  Eyes:     Conjunctiva/sclera: Conjunctivae normal.  Cardiovascular:     Rate and Rhythm: Normal rate and regular rhythm.     Pulses: Normal pulses.     Heart sounds: No murmur heard.   No friction rub. No gallop.  Pulmonary:     Effort: No respiratory distress.     Breath sounds: No wheezing, rhonchi or rales.  Musculoskeletal:     Comments: Patient is a small nodule noted distally to his elbow, there is no surrounding erythema or edema, small breaks in the skin noted on top, there is no noted fluctuance/ induration present, the nodule is hard, immobile.  He had pain with flexion extension of the elbow, neurovascular fully intact in the right upper extremity.  Skin:    General: Skin is warm and dry.  Neurological:     Mental Status: He is alert.  Psychiatric:        Mood and Affect: Mood normal.    ED Results / Procedures / Treatments   Labs (all labs ordered are listed, but only abnormal results are displayed) Labs Reviewed - No data to display  EKG None  Radiology DG Elbow Complete Right  Result Date: 01/22/2021 CLINICAL DATA:  Elbow pain. EXAM: RIGHT ELBOW - COMPLETE 3+ VIEW COMPARISON:  Right elbow x-ray 10/02/2011. FINDINGS: There is  soft tissue swelling overlying the posterior aspect of the elbow. There is no joint effusion. There is no foreign body. There is no acute fracture or dislocation. No cortical erosions are identified. Joint spaces are well maintained. IMPRESSION: 1. No acute bony abnormality. 2. Posterior elbow soft tissue swelling. Electronically Signed   By: M.D.   On: 01/22/2021 20:52    Procedures Procedures   Medications Ordered in ED  Medications - No data to display  ED Course  I have reviewed the triage vital signs and the nursing notes.  Pertinent labs & imaging results that were available during my care of the patient were reviewed by me and considered in my medical decision making (see chart for details).    MDM Rules/Calculators/A&P                          Initial impression-patient presents with a knot on his right elbow.  He is alert, does not appear acute stress, vital signs reassuring.  Will obtain imaging for further evaluation.  Work-up-DG of elbow reveals no acute bony abnormalities, posterior elbow soft tissue swelling present.  Rule out- I have low suspicion for septic arthritis as patient denies IV drug use, skin exam was performed no erythematous, edematous, warm joints noted on exam, no new heart murmur heard on exam.  Low suspicion for fracture or dislocation as x-ray does not feel any significant findings.  Low suspicion for infected bursitis as there is no overlying erythema, area is not warm to the touch, presentation atypical as these are generally not hard to the touch.  Low suspicion for abscess as there is no fluctuance or induration present, I&D will be deferred.  Plan-  Nodule-unclear etiology, possibly lipoma, will start him on antibiotics as this was a small break in his skin have him follow-up with PCP for further evaluation.  Vital signs have remained stable, no indication for hospital admission.  Patient discussed with attending and they agreed with  assessment and plan.  Patient given at home care as well strict return precautions.  Patient verbalized that they understood agreed to said plan.  Final Clinical Impression(s) / ED Diagnoses Final diagnoses:  Elbow mass, right    Rx / DC Orders ED Discharge Orders          Ordered    doxycycline (VIBRAMYCIN) 100 MG capsule  2 times daily        01/22/21 2205             Carroll Sage, PA-C 01/22/21 2206    Eber Hong, MD 01/23/21 [phone]
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
## 2021-01-22 NOTE — Discharge Instructions (Addendum)
Started on antibiotics was take as prescribed, recommend over-the-counter pain medication as needed, you may apply warm compresses and/or ice to the area to help decrease swelling inflammation.  Please follow your PCP for further evaluation.  Come back to the emergency department if you develop chest pain, shortness of breath, severe abdominal pain, uncontrolled nausea, vomiting, diarrhea.

## 2021-03-26 ENCOUNTER — Other Ambulatory Visit: Payer: Self-pay | Admitting: Neurosurgery

## 2021-03-26 ENCOUNTER — Other Ambulatory Visit (HOSPITAL_COMMUNITY): Payer: Self-pay | Admitting: Neurosurgery

## 2021-03-26 DIAGNOSIS — M5416 Radiculopathy, lumbar region: Secondary | ICD-10-CM

## 2021-04-14 ENCOUNTER — Encounter (HOSPITAL_COMMUNITY): Payer: Self-pay

## 2021-04-14 ENCOUNTER — Ambulatory Visit (HOSPITAL_COMMUNITY): Payer: Medicaid Other | Attending: Neurosurgery

## 2021-09-02 ENCOUNTER — Other Ambulatory Visit (HOSPITAL_COMMUNITY): Payer: Medicaid Other

## 2021-09-02 ENCOUNTER — Ambulatory Visit (HOSPITAL_COMMUNITY): Payer: Medicaid Other

## 2021-09-17 ENCOUNTER — Ambulatory Visit (HOSPITAL_COMMUNITY)
Admission: RE | Admit: 2021-09-17 | Discharge: 2021-09-17 | Disposition: A | Discharge status: Home / Self Care | Payer: Medicaid Other | Source: Ambulatory Visit | Attending: Neurosurgery | Admitting: Neurosurgery

## 2021-09-17 DIAGNOSIS — M5416 Radiculopathy, lumbar region: Secondary | ICD-10-CM | POA: Diagnosis present

## 2022-08-14 ENCOUNTER — Emergency Department (HOSPITAL_COMMUNITY): Payer: Medicaid Other

## 2022-08-14 ENCOUNTER — Encounter (HOSPITAL_COMMUNITY): Payer: Self-pay | Admitting: *Deleted

## 2022-08-14 ENCOUNTER — Emergency Department (HOSPITAL_COMMUNITY)
Admission: EM | Admit: 2022-08-14 | Discharge: 2022-08-14 | Disposition: A | Discharge status: Home / Self Care | Payer: Medicaid Other | Attending: Emergency Medicine | Admitting: Emergency Medicine

## 2022-08-14 ENCOUNTER — Other Ambulatory Visit: Payer: Self-pay

## 2022-08-14 DIAGNOSIS — M25532 Pain in left wrist: Secondary | ICD-10-CM | POA: Insufficient documentation

## 2022-08-14 MED ORDER — ACETAMINOPHEN 500 MG PO TABS
1000.0000 mg | ORAL_TABLET | Freq: Once | ORAL | Status: AC
  Administered 2022-08-14: 1000 mg via ORAL
  Filled 2022-08-14: qty 2

## 2022-08-14 NOTE — ED Triage Notes (Signed)
Pt c/o left wrist pain x one week  Pt states he has not had any obvious injury

## 2022-08-14 NOTE — Discharge Instructions (Signed)
Your x-ray today is negative for any obvious bony injuries including fracture or dislocation.  You are being provided with a fitted wrist splint which may help with your symptoms.  I do recommend ice as much is as comfortable over the next several days.  You may also use a heating pad for 20 minutes several times daily starting on Monday.  Plan to get rechecked if this does not improve your symptoms.

## 2022-08-14 NOTE — ED Provider Notes (Signed)
[place] EMERGENCY DEPARTMENT AT [facility] Provider Note   CSN: [id number] Arrival date & time: 08/14/22  1136     History  Chief Complaint  Patient presents with   Wrist Pain    Theodore Crawford is a 47 y.o. male presenting for evaluation of pain in his left wrist which started 1 week ago.  He describes accidentally rolling out of his bed and landed with a his outstretched left hand after which he has had persistent pain at his lateral wrist.  Patient is right handed.  He denies any other injury, specifically no hand elbow or shoulder pain.  He has had no treatment prior to arrival.  The history is provided by the patient.       Home Medications Prior to Admission medications   Medication Sig Start Date End Date Taking? Authorizing Provider  ALPRAZolam Prudy Feeler) 1 MG tablet Take 1 mg by mouth 4 (four) times daily as needed for anxiety.    [provider]  amphetamine-dextroamphetamine (ADDERALL XR) 30 MG 24 hr capsule Take 30 mg by mouth daily.    [provider]  PARoxetine (PAXIL) 20 MG tablet Take 20 mg by mouth daily.    [provider]  predniSONE (DELTASONE) 50 MG tablet Take one tablet daily for 7 days 04/29/20   Burgess Amor, PA-C  risperiDONE (RISPERDAL) 3 MG tablet Take 3-6 mg by mouth 2 (two) times daily. 6mg  at bedtime and 3mg  in the morning    [provider]  dicyclomine (BENTYL) 20 MG tablet Take 1 tablet (20 mg total) by mouth 2 (two) times daily. 08/02/15 08/26/15  Gerhard Munch, MD      Allergies    Amoxicillin, Bee venom, Penicillins, Naproxen, Tramadol, and Voltaren [diclofenac sodium]    Review of Systems   Review of Systems  Constitutional:  Negative for fever.  Musculoskeletal:  Positive for arthralgias. Negative for joint swelling and myalgias.  Neurological:  Negative for weakness and numbness.  All other systems reviewed and are negative.   Physical Exam Updated Vital Signs BP 126/88 (BP Location:  Right Arm)   Pulse (!) 106   Temp 99.2 F (37.3 C) (Oral)   Resp 20   Ht 6' (1.829 m)   Wt 84.4 kg   SpO2 98%   BMI 25.23 kg/m  Physical Exam Constitutional:      Appearance: He is well-developed.  HENT:     Head: Atraumatic.  Cardiovascular:     Comments: Pulses equal bilaterally Musculoskeletal:        General: Tenderness present.     Left wrist: Bony tenderness present. No deformity. Normal pulse.     Cervical back: Normal range of motion.     Comments: Patient is tender to palpation over his distal dorsal lateral ulnar.  He does display full range of motion of the wrist but with discomfort.  He has no hand or finger pain, distal sensation is intact with less than 2-second cap refill in his fingertips.  He has no forearm or elbow pain.  Skin:    General: Skin is warm and dry.  Neurological:     Mental Status: He is alert.     Sensory: No sensory deficit.     Motor: No weakness.     Deep Tendon Reflexes: Reflexes normal.     ED Results / Procedures / Treatments   Labs (all labs ordered are listed, but only abnormal results are displayed) Labs Reviewed - No data to display  EKG None  Radiology DG Wrist Complete Left  Result Date: 08/14/2022 CLINICAL DATA:  Left wrist pain for the past week.  Denies injury. EXAM: LEFT WRIST - COMPLETE 3+ VIEW COMPARISON:  None Available. FINDINGS: There is no evidence of fracture or dislocation. There is no evidence of arthropathy or other focal bone abnormality. Soft tissues are unremarkable. IMPRESSION: Negative. Electronically Signed   By: Obie Dredge M.D.   On: 08/14/2022 13:48    Procedures Procedures    Medications Ordered in ED Medications  acetaminophen (TYLENOL) tablet 1,000 mg (1,000 mg Oral Given 08/14/22 1327)    ED Course/ Medical Decision Making/ A&P                             Medical Decision Making Patient with 1 week history of left wrist pain after a fall.  Differential including fracture, dislocation,  sprain, soft tissue injury.  Amount and/or Complexity of Data Reviewed Radiology: ordered.    Details: Reviewed and discussed with patient.  No fracture or dislocation, I agree with interpretation.  Risk OTC drugs. Risk Details: Patient was advised of x-ray findings.  He was recommended Tylenol or NSAIDs for pain relief.  He was placed in a Velcro wrist splint for comfort.  Role of ice and heat discussed, as needed follow-up with PCP if symptoms persist or worsen.           Final Clinical Impression(s) / ED Diagnoses Final diagnoses:  Left wrist pain    Rx / DC Orders ED Discharge Orders     None         Victoriano Lain 08/15/22 8295    Lonell Grandchild, MD 08/23/22 [phone]

## 2022-12-26 ENCOUNTER — Other Ambulatory Visit: Payer: Self-pay

## 2022-12-26 ENCOUNTER — Emergency Department (HOSPITAL_COMMUNITY)
Admission: EM | Admit: 2022-12-26 | Discharge: 2022-12-26 | Disposition: A | Discharge status: Home / Self Care | Payer: MEDICAID | Attending: Emergency Medicine | Admitting: Emergency Medicine

## 2022-12-26 ENCOUNTER — Encounter (HOSPITAL_COMMUNITY): Payer: Self-pay | Admitting: Emergency Medicine

## 2022-12-26 DIAGNOSIS — L03011 Cellulitis of right finger: Secondary | ICD-10-CM | POA: Insufficient documentation

## 2022-12-26 DIAGNOSIS — M79644 Pain in right finger(s): Secondary | ICD-10-CM | POA: Diagnosis present

## 2022-12-26 MED ORDER — HYDROCODONE-ACETAMINOPHEN 5-325 MG PO TABS: 1.0000 | ORAL_TABLET | Freq: Four times a day (QID) | ORAL | 0 refills | Status: AC | PRN

## 2022-12-26 MED ORDER — CLINDAMYCIN PHOSPHATE 600 MG/50ML IV SOLN
600.0000 mg | Freq: Once | INTRAVENOUS | Status: AC
  Administered 2022-12-26: 600 mg via INTRAVENOUS
  Filled 2022-12-26: qty 50

## 2022-12-26 MED ORDER — MORPHINE SULFATE (PF) 4 MG/ML IV SOLN
4.0000 mg | Freq: Once | INTRAVENOUS | Status: AC
  Administered 2022-12-26: 4 mg via INTRAVENOUS
  Filled 2022-12-26: qty 1

## 2022-12-26 MED ORDER — CLINDAMYCIN HCL 300 MG PO CAPS: 300.0000 mg | ORAL_CAPSULE | Freq: Four times a day (QID) | ORAL | 0 refills | Status: AC

## 2022-12-26 NOTE — Discharge Instructions (Signed)
Begin taking clindamycin as prescribed.  Begin taking ibuprofen 600 mg every 6 hours as needed for pain.  Take hydrocodone as prescribed as needed for pain not relieved with ibuprofen.  Apply warm compresses or perform warm soaks as frequently as possible for the next several days.  Follow-up with your primary doctor if symptoms are not improving.

## 2022-12-26 NOTE — ED Triage Notes (Signed)
Pt states he noticed a small "bump" on his R index finger on Monday. States he didn't know if he was "maybe bit by a spider or what". Pt states area has gotten worse since then and is "extremely painful". Pt has area of abscess to R index finger which is very red and swollen.

## 2022-12-26 NOTE — ED Provider Notes (Signed)
[place] EMERGENCY DEPARTMENT AT [facility] Provider Note   CSN: [id number] Arrival date & time: 12/26/22  0244     History  Chief Complaint  Patient presents with   Abscess    VARIAN WIND is a 47 y.o. male.  Patient is a 47 year old male presenting with complaints of right index finger pain, redness, swelling, and drainage.  This has been worsening over the past week.  It started as a small bump, then became worse as the week is gone on.  Pain is worse with palpation and movement.  No alleviating factors.  He denies fevers, chills, injury, or trauma.  The history is provided by the patient.       Home Medications Prior to Admission medications   Medication Sig Start Date End Date Taking? Authorizing Provider  ALPRAZolam Prudy Feeler) 1 MG tablet Take 1 mg by mouth 4 (four) times daily as needed for anxiety.    [provider]  amphetamine-dextroamphetamine (ADDERALL XR) 30 MG 24 hr capsule Take 30 mg by mouth daily.    [provider]  PARoxetine (PAXIL) 20 MG tablet Take 20 mg by mouth daily.    [provider]  predniSONE (DELTASONE) 50 MG tablet Take one tablet daily for 7 days 04/29/20   Burgess Amor, PA-C  risperiDONE (RISPERDAL) 3 MG tablet Take 3-6 mg by mouth 2 (two) times daily. 6mg  at bedtime and 3mg  in the morning    [provider]  dicyclomine (BENTYL) 20 MG tablet Take 1 tablet (20 mg total) by mouth 2 (two) times daily. 08/02/15 08/26/15  Gerhard Munch, MD      Allergies    Amoxicillin, Bee venom, Penicillins, Naproxen, Tramadol, and Voltaren [diclofenac sodium]    Review of Systems   Review of Systems  All other systems reviewed and are negative.   Physical Exam Updated Vital Signs BP 128/87 (BP Location: Right Arm)   Pulse (!) 117   Temp 99.7 F (37.6 C) (Oral)   Resp 18   Ht 6' (1.829 m)   Wt 81.6 kg   SpO2 96%   BMI 24.41 kg/m  Physical Exam Vitals and nursing note reviewed.  Constitutional:       Appearance: Normal appearance.  Pulmonary:     Effort: Pulmonary effort is normal.  Skin:    General: Skin is warm and dry.     Comments: To the lateral aspect of the right index finger just distal to the MCP joint, there is an area of drainage and surrounding erythema extending into the hand and further down the finger.  He is able to flex and extend the finger, but is limited secondary to swelling and discomfort.  Neurological:     Mental Status: He is alert and oriented to person, place, and time.     ED Results / Procedures / Treatments   Labs (all labs ordered are listed, but only abnormal results are displayed) Labs Reviewed - No data to display  EKG None  Radiology No results found.  Procedures Procedures    Medications Ordered in ED Medications  clindamycin (CLEOCIN) IVPB 600 mg (has no administration in time range)    ED Course/ Medical Decision Making/ A&P  Patient presenting with a draining abscess/cellulitis of his right index finger.  This has been treated with IV clindamycin.  Patient also given morphine for pain.  He will be discharged with clindamycin and pain medication.  To follow-up with primary doctor if not improving.  Final Clinical  Impression(s) / ED Diagnoses Final diagnoses:  None    Rx / DC Orders ED Discharge Orders     None         Geoffery Lyons, MD 12/26/22 [phone]

## 2023-09-07 ENCOUNTER — Other Ambulatory Visit: Payer: Self-pay

## 2023-09-07 ENCOUNTER — Encounter (HOSPITAL_COMMUNITY): Payer: Self-pay

## 2023-09-07 ENCOUNTER — Emergency Department (HOSPITAL_COMMUNITY)
Admission: EM | Admit: 2023-09-07 | Discharge: 2023-09-07 | Disposition: A | Discharge status: Home / Self Care | Payer: MEDICAID | Attending: Student | Admitting: Student

## 2023-09-07 DIAGNOSIS — K029 Dental caries, unspecified: Secondary | ICD-10-CM | POA: Diagnosis not present

## 2023-09-07 DIAGNOSIS — F17210 Nicotine dependence, cigarettes, uncomplicated: Secondary | ICD-10-CM | POA: Insufficient documentation

## 2023-09-07 DIAGNOSIS — I10 Essential (primary) hypertension: Secondary | ICD-10-CM | POA: Insufficient documentation

## 2023-09-07 DIAGNOSIS — K0889 Other specified disorders of teeth and supporting structures: Secondary | ICD-10-CM | POA: Diagnosis present

## 2023-09-07 MED ORDER — AMOXICILLIN-POT CLAVULANATE 875-125 MG PO TABS
1.0000 | ORAL_TABLET | Freq: Once | ORAL | Status: AC
  Administered 2023-09-07: 1 via ORAL
  Filled 2023-09-07: qty 1

## 2023-09-07 MED ORDER — CHLORHEXIDINE GLUCONATE 0.12 % MT SOLN: 15.0000 mL | Freq: Two times a day (BID) | OROMUCOSAL | 0 refills | Status: AC

## 2023-09-07 MED ORDER — AMOXICILLIN-POT CLAVULANATE 875-125 MG PO TABS: 1.0000 | ORAL_TABLET | Freq: Two times a day (BID) | ORAL | 0 refills | Status: AC

## 2023-09-07 NOTE — ED Notes (Signed)
 Pt continues to deny adverse effects after med administration, will be discharged

## 2023-09-07 NOTE — ED Notes (Addendum)
 Pt given amoxicillin for trial and will be observed for 1 hour to watch for negative affects.

## 2023-09-07 NOTE — ED Notes (Signed)
 Pt denies SOB, itching and pain following amoxicillin administration

## 2023-09-07 NOTE — ED Triage Notes (Signed)
 Pt arrived via pOV c/o severe dental infection on the bottom row of his teeth. Pt reports he has a dental appointment on June 25th and needs help getting the infection under control prior to his appointment.

## 2023-09-07 NOTE — ED Provider Notes (Signed)
 [place] EMERGENCY DEPARTMENT AT [facility] Provider Note  CSN: [id number] Arrival date & time: 09/07/23 1723  Chief Complaint(s) Dental Problem  HPI KAYLOB WALLEN is a 48 y.o. male with PMH anxiety, IV drug use, PTSD, chronic mood disorder who presents emergency department for evaluation of dental pain.  States that she has a dental appointment set up for September 29, 2023 and is requesting antibiotics due to the pain to help get him to the dentist.  States he has used clindamycin  in the past without improvement.  Denies any airway involvement including shortness of breath or facial swelling.   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 Authorizing Provider  ALPRAZolam  (XANAX ) 1 MG tablet Take 1 mg by mouth 4 (four) times daily as needed for anxiety.    [provider]  amphetamine-dextroamphetamine (ADDERALL XR) 30 MG 24 hr capsule Take 30 mg by mouth daily.    [provider]  clindamycin  (CLEOCIN ) 300 MG capsule Take 1 capsule (300 mg total) by mouth 4 (four) times daily. X 7 days 12/26/22   Orvilla Blander, MD  HYDROcodone -acetaminophen  (NORCO) 5-325 MG tablet Take 1-2 tablets by mouth every 6 (six) hours as needed. 12/26/22   Orvilla Blander, MD  PARoxetine  (PAXIL ) 20 MG tablet Take  20 mg by mouth daily.    [provider]  predniSONE  (DELTASONE ) 50 MG tablet Take one tablet daily for 7 days 04/29/20   Idol, Julie, PA-C  risperiDONE  (RISPERDAL ) 3 MG tablet Take 3-6 mg by mouth 2 (two) times daily. 6mg  at bedtime and 3mg  in the morning    [provider]  dicyclomine  (BENTYL ) 20 MG tablet Take 1 tablet (20 mg total) by mouth 2 (two) times daily. 08/02/15 08/26/15  Dorenda Gandy, MD                                                                                                                                    Past Surgical History Past Surgical History:  Procedure Laterality Date   CHEST TUBE INSERTION     CHOLECYSTECTOMY N/A 09/04/2015   Procedure: LAPAROSCOPIC CHOLECYSTECTOMY;  Surgeon: Franki Isles, MD;  Location: [facility];  Service: General;  Laterality: N/A;   GALLBLADDER SURGERY  09/04/2015   Family History Family History  Problem Relation Age of Onset   COPD Mother    Heart disease Mother    Depression Father    Hyperlipidemia Maternal Grandmother    Hyperlipidemia Maternal Grandfather    Hyperlipidemia Paternal Grandmother    Hyperlipidemia Paternal Grandfather    Cancer Paternal Grandfather        lung    Social History Social History   Tobacco Use   Smoking status: Every Day    Current packs/day: 1.00    Types: Cigarettes    Passive exposure: Current   Smokeless tobacco: Never  Vaping Use   Vaping status: Never Used  Substance Use Topics   Alcohol use: Yes    Alcohol/week: 4.0 standard drinks of alcohol    Types: 4 Cans of beer per week    Comment: occ. use   Drug use: Not Currently    Types: Marijuana, IV, Methamphetamines    Comment: contiues Marijuana    Allergies Amoxicillin, Bee venom, Penicillins, Naproxen , Tramadol , and Voltaren  [diclofenac  sodium]  Review of Systems Review of Systems  HENT:  Positive for dental problem.     Physical Exam Vital Signs  I have reviewed the triage vital signs BP 112/79  (BP Location: Right Arm)   Pulse (!) 110   Temp (!) 97.4 F (36.3 C) (Temporal)   Resp 18   Ht 6' (1.829 m)   Wt 81.6 kg   SpO2 97%   BMI 24.41 kg/m   Physical Exam Constitutional:      General: He is not in acute distress.    Appearance: Normal appearance.  HENT:     Head: Normocephalic and atraumatic.     Comments: Multiple lower dental caries    Nose: No congestion or rhinorrhea.  Eyes:     General:        Right eye: No discharge.        Left eye: No discharge.     Extraocular Movements: Extraocular movements intact.     Pupils: Pupils are equal, round, and reactive to light.  Cardiovascular:     Rate and Rhythm: Normal rate and regular rhythm.     Heart sounds: No murmur heard. Pulmonary:     Effort: No respiratory distress.     Breath sounds: No wheezing or rales.  Abdominal:     General: There is no distension.     Tenderness: There is no abdominal tenderness.  Musculoskeletal:        General: Normal range of motion.     Cervical back: Normal range of motion.  Skin:    General: Skin is warm and dry.  Neurological:     General: No focal deficit present.     Mental Status: He is alert.     ED Results and Treatments Labs (all labs ordered are listed, but only abnormal results are displayed) Labs Reviewed - No data to display  Radiology No results found.  Pertinent labs & imaging results that were available during my care of the patient were reviewed by me and considered in my medical decision making (see MDM for details).  Medications Ordered in ED Medications  amoxicillin-clavulanate (AUGMENTIN) 875-125 MG per tablet 1 tablet (1 tablet Oral Given 09/07/23 1753)                                                                                                                                     Procedures Procedures  (including critical  care time)  Medical Decision Making / ED Course   This patient presents to the ED for concern of dental pain, this involves an extensive number of treatment options, and is a complaint that carries with it a high risk of complications and morbidity.  The differential diagnosis includes dental caries, pulpitis, periapical abscess, pericoronitis, tooth fracture, Ludwig's, submandibular abscess  MDM: Patient seen emergency room for evaluation of dental pain.  No physical exam with multiple dental caries at the lower bridge but no submental swelling or concern for abscess.  Patients chart states that he has a history of swelling when taking penicillins but I had an in-depth discussion with the patient about his allergy and he states that he was told as a kid that he had an allergy and cannot remember a time where he had a severe reaction to any penicillin based products.  Thus, we did a penicillin trial here in the emergency department.  He was given Augmentin and observed for 1 hour.  He did not have any significant allergic symptoms including no hives, no shortness of breath, no facial swelling, no oropharyngeal swelling.  Suspect that this is a longstanding allergy that was carryover from multiple charts over many years but never actually tested.  I will remove this from his chart and he will start on Augmentin until he can see his dentist.  At this time he does not meet inpatient criteria for admission will be discharged with outpatient follow-up.  Return precautions given which he voiced understanding.   Additional history obtained:  -External records from outside source obtained and reviewed including: Chart review including previous notes, labs, imaging, consultation notes   Medicines ordered and prescription drug management: Meds ordered this encounter  Medications   amoxicillin-clavulanate (AUGMENTIN) 875-125 MG per tablet 1 tablet    -I have reviewed the patients home medicines and have  made adjustments as needed  Critical interventions none   Social Determinants of Health:  Factors impacting patients care include: none   Reevaluation: After the interventions noted above, I reevaluated the patient and found that they have :improved  Co morbidities that complicate the patient evaluation  Past Medical History:  Diagnosis Date   Allergy    Anxiety    Anxiety disorder    Back pain    Bipolar 1 disorder (HCC)    Depression    Hypertension  IVDU (intravenous drug user)    Leg pain, right    Neuromuscular disorder (HCC)    PTSD (post-traumatic stress disorder)       Dispostion: I considered admission for this patient, but at this time he does not meet inpatient criteria for admission will be discharged with outpatient follow-up.     Final Clinical Impression(s) / ED Diagnoses Final diagnoses:  None     @PCDICTATION @    Karlyn Overman, MD 09/07/23 1902

## 2023-10-30 ENCOUNTER — Emergency Department: Payer: MEDICAID

## 2023-10-30 ENCOUNTER — Other Ambulatory Visit: Payer: Self-pay

## 2023-10-30 ENCOUNTER — Encounter: Payer: Self-pay | Admitting: Emergency Medicine

## 2023-10-30 DIAGNOSIS — S39012A Strain of muscle, fascia and tendon of lower back, initial encounter: Secondary | ICD-10-CM | POA: Diagnosis not present

## 2023-10-30 DIAGNOSIS — I10 Essential (primary) hypertension: Secondary | ICD-10-CM | POA: Diagnosis not present

## 2023-10-30 DIAGNOSIS — R0789 Other chest pain: Secondary | ICD-10-CM | POA: Diagnosis not present

## 2023-10-30 DIAGNOSIS — S3992XA Unspecified injury of lower back, initial encounter: Secondary | ICD-10-CM | POA: Diagnosis present

## 2023-10-30 DIAGNOSIS — X58XXXA Exposure to other specified factors, initial encounter: Secondary | ICD-10-CM | POA: Diagnosis not present

## 2023-10-30 DIAGNOSIS — N179 Acute kidney failure, unspecified: Secondary | ICD-10-CM | POA: Insufficient documentation

## 2023-10-30 LAB — BASIC METABOLIC PANEL WITH GFR
Anion gap: 18 — ABNORMAL HIGH (ref 5–15)
BUN: 26 mg/dL — ABNORMAL HIGH (ref 6–20)
CO2: 18 mmol/L — ABNORMAL LOW (ref 22–32)
Calcium: 10.1 mg/dL (ref 8.9–10.3)
Chloride: 97 mmol/L — ABNORMAL LOW (ref 98–111)
Creatinine, Ser: 2.31 mg/dL — ABNORMAL HIGH (ref 0.61–1.24)
GFR, Estimated: 35 mL/min — ABNORMAL LOW (ref >60)
Glucose, Bld: 104 mg/dL — ABNORMAL HIGH (ref 70–99)
Potassium: 3.1 mmol/L — ABNORMAL LOW (ref 3.5–5.1)
Sodium: 133 mmol/L — ABNORMAL LOW (ref 135–145)

## 2023-10-30 LAB — CBC
HCT: 38.8 % — ABNORMAL LOW (ref 39.0–52.0)
Hemoglobin: 13.9 g/dL (ref 13.0–17.0)
MCH: 30.8 pg (ref 26.0–34.0)
MCHC: 35.8 g/dL (ref 30.0–36.0)
MCV: 85.8 fL (ref 80.0–100.0)
Platelets: 386 K/uL (ref 150–400)
RBC: 4.52 MIL/uL (ref 4.22–5.81)
RDW: 13.2 % (ref 11.5–15.5)
WBC: 9 K/uL (ref 4.0–10.5)
nRBC: 0 % (ref 0.0–0.2)

## 2023-10-30 LAB — TROPONIN I (HIGH SENSITIVITY): Troponin I (High Sensitivity): 14 ng/L (ref <18)

## 2023-10-30 NOTE — ED Notes (Addendum)
 Pt to ed from home via [facility] for anxiety. Pt has substernal non radiating CP and RR of 50x min.  ETCO 2 11 12lead was ST.  Pt got 4 baby ASA on scene.  Pt is very upset and anxious in the lobby. Per chart pt has been flagged as aggressive at [facility].  Pt appears to be under the influence of drugs.

## 2023-10-30 NOTE — ED Triage Notes (Signed)
 To ER from home with increased chronic back pain with associated chest pain. Takes subutex but no relief today. Very restless in triage.

## 2023-10-30 NOTE — ED Notes (Signed)
 Pt to desk and states I am going out to smoke but I might not be back.

## 2023-10-31 ENCOUNTER — Emergency Department: Payer: MEDICAID

## 2023-10-31 ENCOUNTER — Emergency Department
Admission: EM | Admit: 2023-10-31 | Discharge: 2023-10-31 | Disposition: A | Discharge status: Home / Self Care | Payer: MEDICAID | Attending: Emergency Medicine | Admitting: Emergency Medicine

## 2023-10-31 DIAGNOSIS — S39012A Strain of muscle, fascia and tendon of lower back, initial encounter: Secondary | ICD-10-CM

## 2023-10-31 DIAGNOSIS — N179 Acute kidney failure, unspecified: Secondary | ICD-10-CM

## 2023-10-31 DIAGNOSIS — M545 Low back pain, unspecified: Secondary | ICD-10-CM

## 2023-10-31 DIAGNOSIS — R0789 Other chest pain: Secondary | ICD-10-CM

## 2023-10-31 DIAGNOSIS — E876 Hypokalemia: Secondary | ICD-10-CM

## 2023-10-31 LAB — LIPASE, BLOOD: Lipase: 66 U/L — ABNORMAL HIGH (ref 11–51)

## 2023-10-31 LAB — HEPATIC FUNCTION PANEL
ALT: 17 U/L (ref 0–44)
AST: 32 U/L (ref 15–41)
Albumin: 5 g/dL (ref 3.5–5.0)
Alkaline Phosphatase: 96 U/L (ref 38–126)
Bilirubin, Direct: 0.1 mg/dL (ref 0.0–0.2)
Indirect Bilirubin: 1.3 mg/dL — ABNORMAL HIGH (ref 0.3–0.9)
Total Bilirubin: 1.4 mg/dL — ABNORMAL HIGH (ref 0.0–1.2)
Total Protein: 8.2 g/dL — ABNORMAL HIGH (ref 6.5–8.1)

## 2023-10-31 LAB — MAGNESIUM: Magnesium: 1.9 mg/dL (ref 1.7–2.4)

## 2023-10-31 LAB — CK: Total CK: 241 U/L (ref 49–397)

## 2023-10-31 LAB — TROPONIN I (HIGH SENSITIVITY): Troponin I (High Sensitivity): 12 ng/L (ref <18)

## 2023-10-31 MED ORDER — ONDANSETRON 4 MG PO TBDP: 4.0000 mg | ORAL_TABLET | Freq: Three times a day (TID) | ORAL | 0 refills | Status: AC | PRN

## 2023-10-31 MED ORDER — LIDOCAINE 5 % EX PTCH: 1.0000 | MEDICATED_PATCH | CUTANEOUS | 0 refills | Status: AC

## 2023-10-31 MED ORDER — SODIUM CHLORIDE 0.9 % IV BOLUS
1000.0000 mL | Freq: Once | INTRAVENOUS | Status: AC
  Administered 2023-10-31: 1000 mL via INTRAVENOUS

## 2023-10-31 MED ORDER — POTASSIUM CHLORIDE CRYS ER 20 MEQ PO TBCR
40.0000 meq | EXTENDED_RELEASE_TABLET | Freq: Once | ORAL | Status: AC
  Administered 2023-10-31: 40 meq via ORAL
  Filled 2023-10-31: qty 2

## 2023-10-31 MED ORDER — KETOROLAC TROMETHAMINE 15 MG/ML IJ SOLN
15.0000 mg | Freq: Once | INTRAMUSCULAR | Status: AC
  Administered 2023-10-31: 15 mg via INTRAVENOUS
  Filled 2023-10-31: qty 1

## 2023-10-31 MED ORDER — ONDANSETRON HCL 4 MG/2ML IJ SOLN
4.0000 mg | Freq: Once | INTRAMUSCULAR | Status: AC
  Administered 2023-10-31: 4 mg via INTRAVENOUS
  Filled 2023-10-31: qty 2

## 2023-10-31 MED ORDER — LIDOCAINE 5 % EX PTCH
2.0000 | MEDICATED_PATCH | CUTANEOUS | Status: DC
  Administered 2023-10-31: 2 via TRANSDERMAL
  Filled 2023-10-31: qty 2

## 2023-10-31 MED ORDER — ACETAMINOPHEN 500 MG PO TABS
1000.0000 mg | ORAL_TABLET | Freq: Once | ORAL | Status: AC
  Administered 2023-10-31: 1000 mg via ORAL
  Filled 2023-10-31: qty 2

## 2023-10-31 NOTE — ED Provider Notes (Signed)
 [facility] Provider Note    Event Date/Time   First MD Initiated Contact with Patient 10/31/23 [phone]     (approximate)   History   Back Pain and Chest Pain   HPI  Theodore Crawford is a 48 y.o. male   Past medical history of bipolar, anxiety, depression, hypertension and substance use who presents to the emergency department with acute on chronic lower back pain, and chest pain.  He has chronic lower back pain and was exacerbated by his mowing the lawn in the hot sun yesterday.  He had no fall or direct trauma.  His back feels tight and sore.  He also had chest pain substernal nonradiating nonexertional started tonight while talking to his brother on the telephone.  No shortness of breath.  He reports no preceding illnesses otherwise been feeling well.  While being out in the sun mowing his lawn yesterday he had tried to keep up with water intake but feels he might be dehydrated.    External Medical Documents Reviewed: Prior hospital notes visits for chronic back pain, dental pain      Physical Exam   Triage Vital Signs: ED Triage Vitals  Encounter Vitals Group     BP 10/30/23 2313 132/86     Girls Systolic BP Percentile --      Girls Diastolic BP Percentile --      Boys Systolic BP Percentile --      Boys Diastolic BP Percentile --      Pulse Rate 10/30/23 2313 100     Resp 10/30/23 2313 (!) 22     Temp 10/30/23 2313 98.5 F (36.9 C)     Temp Source 10/30/23 2313 Oral     SpO2 10/30/23 2313 100 %     Weight 10/30/23 2312 175 lb (79.4 kg)     Height 10/30/23 2312 6' (1.829 m)     Head Circumference --      Peak Flow --      Pain Score 10/30/23 2313 10     Pain Loc --      Pain Education --      Exclude from Growth Chart --     Most recent vital signs: Vitals:   10/30/23 2313  BP: 132/86  Pulse: 100  Resp: (!) 22  Temp: 98.5 F (36.9 C)  SpO2: 100%    General: Awake, no distress.  CV:  Good peripheral perfusion.   Resp:  Normal effort.  Abd:  No distention.  Other:  Patient resting comfortably no acute distress no respiratory distress nontoxic and pleasant and conversing with me.  Has bilateral paraspinal muscular tenderness in the lumbar area with no midline tenderness deformity step-off.  Chest wall without tenderness and clear lungs and normal heart sounds without murmur.  Mucous membranes appear slightly dry looks slightly dehydrated.  He has a soft benign abdominal exam.   ED Results / Procedures / Treatments   Labs (all labs ordered are listed, but only abnormal results are displayed) Labs Reviewed  BASIC METABOLIC PANEL WITH GFR - Abnormal; Notable for the following components:      Result Value   Sodium 133 (*)    Potassium 3.1 (*)    Chloride 97 (*)    CO2 18 (*)    Glucose, Bld 104 (*)    BUN 26 (*)    Creatinine, Ser 2.31 (*)    GFR, Estimated 35 (*)    Anion gap 18 (*)  All other components within normal limits  CBC - Abnormal; Notable for the following components:   HCT 38.8 (*)    All other components within normal limits  LIPASE, BLOOD  HEPATIC FUNCTION PANEL  CK  MAGNESIUM  TROPONIN I (HIGH SENSITIVITY)  TROPONIN I (HIGH SENSITIVITY)     I ordered and reviewed the above labs they are notable for is an AKI with a creatinine of 2.31 and a slight anion gap 18, potassium slightly low at 3.1.  EKG  ED ECG REPORT I, Ginnie Shams, the attending physician, personally viewed and interpreted this ECG.   Date: 10/31/2023  EKG Time: 2312  Rate: 98  Rhythm: sinus  Axis: nl  Intervals:nl  ST&T Change: no stemi    RADIOLOGY I independently reviewed and interpreted chest x-ray I see no obvious focality pneumothorax I also reviewed radiologist's formal read.   PROCEDURES:  Critical Care performed: No  Procedures   MEDICATIONS ORDERED IN ED: Medications  potassium chloride  SA (KLOR-CON  M) CR tablet 40 mEq (has no administration in time range)  acetaminophen   (TYLENOL ) tablet 1,000 mg (has no administration in time range)  lidocaine  (LIDODERM ) 5 % 2 patch (has no administration in time range)  sodium chloride  0.9 % bolus 1,000 mL (1,000 mLs Intravenous New Bag/Given 10/31/23 0056)  ketorolac  (TORADOL ) 15 MG/ML injection 15 mg (15 mg Intravenous Given 10/31/23 0053)  ondansetron  (ZOFRAN ) injection 4 mg (4 mg Intravenous Given 10/31/23 0053)     IMPRESSION / MDM / ASSESSMENT AND PLAN / ED COURSE  I reviewed the triage vital signs and the nursing notes.                                Patient's presentation is most consistent with acute presentation with potential threat to life or bodily function.  Differential diagnosis includes, but is not limited to, musculoskeletal pain, muscle spasms, dehydration electrolyte derangements, ACS, PE, dissection, acute on chronic musculoskeletal back pain, considered but less likely cord compression or fracture   The patient is on the cardiac monitor to evaluate for evidence of arrhythmia and/or significant heart rate changes.  MDM:    Here with very nonspecific chest pain for cardiopulmonary emergencies like ACS PE or dissection.  Given his risk factors, and acuity of onset I think prudent to check EKG which fortunately looks nonischemic, but also serial troponins.  Already got aspirin .  Not clinically congruent with dissection or PE, and no respiratory infectious symptoms to suggest infection.  In terms of his lower back pain think this was exacerbated by his lawnmowing yesterday.  No midline tenderness or red flag symptoms to suggest fracture or cord compression.  His labs are reflective of slight dehydration after working in the sun yesterday, with a AKI and hypokalemia.  Will give fluids and replete potassium, check CK.  Medicate with IV Toradol , fluids, potassium repletion, Lidoderm .   I considered hospitalization for admission or observation however given his unremarkable evaluation and stability in  the Emergency Department, ability to tolerate p.o. and hydration at home, I think he can rest at home and follow-up with PMD and return with any new or worsening symptoms.        FINAL CLINICAL IMPRESSION(S) / ED DIAGNOSES   Final diagnoses:  Acute bilateral low back pain without sciatica  Strain of lumbar region, initial encounter  Atypical chest pain  AKI (acute kidney injury) (HCC)     Rx / DC  Orders   ED Discharge Orders     None        Note:  This document was prepared using Dragon voice recognition software and may include unintentional dictation errors.    Cyrena Mylar, MD 10/31/23 [phone]

## 2023-10-31 NOTE — Discharge Instructions (Addendum)
 Fortunately your heart test did NOT show a heart attack.    I think much of the pain you are having is from muscle cramping due to dehydration and low potassium.  Take Zofran  as needed for nausea and vomiting.  Drink plenty of fluids to stay well-hydrated.  Find Pedialyte or similar electrolyte rehydration formulas at your local pharmacy.  Have your doctor recheck your kidney levels and potassium levels to make sure that they are improving within the next 1 to 2 weeks.  If you do not have a doctor I have made a referral for a doctor who will call you for an appointment.   Use lidocaine  patch as needed for lower back pain. Take acetaminophen  650 mg and ibuprofen  400 mg every 6 hours for pain.  Take with food.  Thank you for choosing us  for your health care today!  Please see your primary doctor this week for a follow up appointment.   If you have any new, worsening, or unexpected symptoms call your doctor right away or come back to the emergency department for reevaluation.  It was my pleasure to care for you today.   Ginnie EDISON Cyrena, MD
